# Patient Record
Sex: Male | Born: 1955 | Race: White | Hispanic: No | Marital: Married | Smoking: Current every day smoker
Health system: Southern US, Community
[De-identification: ages and names within clinical notes are randomized; demographics above are authoritative.]

## PROBLEM LIST (undated history)

## (undated) DIAGNOSIS — R39198 Other difficulties with micturition: Secondary | ICD-10-CM

## (undated) DIAGNOSIS — H05231 Hemorrhage of right orbit: Secondary | ICD-10-CM

## (undated) DIAGNOSIS — S81802A Unspecified open wound, left lower leg, initial encounter: Secondary | ICD-10-CM

## (undated) DIAGNOSIS — L97929 Non-pressure chronic ulcer of unspecified part of left lower leg with unspecified severity: Secondary | ICD-10-CM

## (undated) DIAGNOSIS — I96 Gangrene, not elsewhere classified: Secondary | ICD-10-CM

## (undated) DIAGNOSIS — L97509 Non-pressure chronic ulcer of other part of unspecified foot with unspecified severity: Secondary | ICD-10-CM

## (undated) DIAGNOSIS — N400 Enlarged prostate without lower urinary tract symptoms: Secondary | ICD-10-CM

## (undated) DIAGNOSIS — N179 Acute kidney failure, unspecified: Secondary | ICD-10-CM

## (undated) DIAGNOSIS — E039 Hypothyroidism, unspecified: Secondary | ICD-10-CM

## (undated) DIAGNOSIS — S6291XA Unspecified fracture of right wrist and hand, initial encounter for closed fracture: Secondary | ICD-10-CM

## (undated) DIAGNOSIS — G629 Polyneuropathy, unspecified: Secondary | ICD-10-CM

## (undated) DIAGNOSIS — L03116 Cellulitis of left lower limb: Secondary | ICD-10-CM

## (undated) DIAGNOSIS — T4145XA Adverse effect of unspecified anesthetic, initial encounter: Secondary | ICD-10-CM

## (undated) DIAGNOSIS — I739 Peripheral vascular disease, unspecified: Secondary | ICD-10-CM

## (undated) DIAGNOSIS — I509 Heart failure, unspecified: Secondary | ICD-10-CM

## (undated) DIAGNOSIS — T879 Unspecified complications of amputation stump: Secondary | ICD-10-CM

## (undated) DIAGNOSIS — L03115 Cellulitis of right lower limb: Secondary | ICD-10-CM

## (undated) DIAGNOSIS — Z792 Long term (current) use of antibiotics: Secondary | ICD-10-CM

## (undated) DIAGNOSIS — T8859XA Other complications of anesthesia, initial encounter: Secondary | ICD-10-CM

## (undated) DIAGNOSIS — L0390 Cellulitis, unspecified: Secondary | ICD-10-CM

## (undated) HISTORY — DX: Cellulitis of right lower limb: L03.116

## (undated) HISTORY — DX: Gangrene, not elsewhere classified: I96

## (undated) HISTORY — DX: Non-pressure chronic ulcer of unspecified part of left lower leg with unspecified severity: L97.929

## (undated) HISTORY — DX: Peripheral vascular disease, unspecified: I73.9

## (undated) HISTORY — DX: Non-pressure chronic ulcer of other part of unspecified foot with unspecified severity: L97.509

## (undated) HISTORY — DX: Polyneuropathy, unspecified: G62.9

## (undated) HISTORY — DX: Hypothyroidism, unspecified: E03.9

## (undated) HISTORY — DX: Heart failure, unspecified: I50.9

## (undated) HISTORY — DX: Unspecified fracture of right hand, initial encounter for closed fracture: S62.91XA

## (undated) HISTORY — DX: Hemorrhage of right orbit: H05.231

## (undated) HISTORY — DX: Cellulitis, unspecified: L03.90

## (undated) HISTORY — PX: CYST EXCISION: SHX5701

## (undated) HISTORY — DX: Acute kidney failure, unspecified: N17.9

## (undated) HISTORY — DX: Cellulitis of right lower limb: L03.115

## (undated) HISTORY — DX: Unspecified complications of amputation stump: T87.9

## (undated) HISTORY — DX: Unspecified open wound, left lower leg, initial encounter: S81.802A

## (undated) HISTORY — PX: PILONIDAL CYST DRAINAGE: SHX743

## (undated) HISTORY — DX: Long term (current) use of antibiotics: Z79.2

---

## 2017-10-19 DIAGNOSIS — T796XXA Traumatic ischemia of muscle, initial encounter: Secondary | ICD-10-CM

## 2017-10-19 DIAGNOSIS — W19XXXA Unspecified fall, initial encounter: Secondary | ICD-10-CM | POA: Diagnosis not present

## 2017-10-19 DIAGNOSIS — L03116 Cellulitis of left lower limb: Secondary | ICD-10-CM

## 2017-10-19 DIAGNOSIS — S0993XA Unspecified injury of face, initial encounter: Secondary | ICD-10-CM

## 2017-10-19 DIAGNOSIS — I5042 Chronic combined systolic (congestive) and diastolic (congestive) heart failure: Secondary | ICD-10-CM

## 2017-10-19 DIAGNOSIS — G8191 Hemiplegia, unspecified affecting right dominant side: Secondary | ICD-10-CM | POA: Diagnosis not present

## 2017-10-20 DIAGNOSIS — S0993XA Unspecified injury of face, initial encounter: Secondary | ICD-10-CM | POA: Diagnosis not present

## 2017-10-20 DIAGNOSIS — I509 Heart failure, unspecified: Secondary | ICD-10-CM

## 2017-10-20 DIAGNOSIS — L03116 Cellulitis of left lower limb: Secondary | ICD-10-CM | POA: Diagnosis not present

## 2017-10-20 DIAGNOSIS — T796XXA Traumatic ischemia of muscle, initial encounter: Secondary | ICD-10-CM | POA: Diagnosis not present

## 2017-10-20 DIAGNOSIS — I5042 Chronic combined systolic (congestive) and diastolic (congestive) heart failure: Secondary | ICD-10-CM | POA: Diagnosis not present

## 2017-10-21 DIAGNOSIS — L03116 Cellulitis of left lower limb: Secondary | ICD-10-CM | POA: Diagnosis not present

## 2017-10-21 DIAGNOSIS — T796XXA Traumatic ischemia of muscle, initial encounter: Secondary | ICD-10-CM | POA: Diagnosis not present

## 2017-10-21 DIAGNOSIS — I5042 Chronic combined systolic (congestive) and diastolic (congestive) heart failure: Secondary | ICD-10-CM | POA: Diagnosis not present

## 2017-10-21 DIAGNOSIS — S0993XA Unspecified injury of face, initial encounter: Secondary | ICD-10-CM | POA: Diagnosis not present

## 2017-10-22 DIAGNOSIS — R5383 Other fatigue: Secondary | ICD-10-CM

## 2017-10-22 HISTORY — DX: Other fatigue: R53.83

## 2017-10-24 ENCOUNTER — Encounter: Payer: Self-pay | Admitting: Vascular Surgery

## 2017-10-25 ENCOUNTER — Ambulatory Visit: Payer: PRIVATE HEALTH INSURANCE | Admitting: Sports Medicine

## 2017-10-25 ENCOUNTER — Telehealth: Payer: Self-pay | Admitting: *Deleted

## 2017-10-25 ENCOUNTER — Encounter: Payer: Self-pay | Admitting: Sports Medicine

## 2017-10-25 DIAGNOSIS — I96 Gangrene, not elsewhere classified: Secondary | ICD-10-CM | POA: Diagnosis not present

## 2017-10-25 DIAGNOSIS — I739 Peripheral vascular disease, unspecified: Secondary | ICD-10-CM

## 2017-10-25 DIAGNOSIS — M79672 Pain in left foot: Secondary | ICD-10-CM

## 2017-10-25 DIAGNOSIS — I83029 Varicose veins of left lower extremity with ulcer of unspecified site: Secondary | ICD-10-CM

## 2017-10-25 DIAGNOSIS — I87013 Postthrombotic syndrome with ulcer of bilateral lower extremity: Secondary | ICD-10-CM | POA: Diagnosis not present

## 2017-10-25 DIAGNOSIS — L97919 Non-pressure chronic ulcer of unspecified part of right lower leg with unspecified severity: Secondary | ICD-10-CM

## 2017-10-25 DIAGNOSIS — L97929 Non-pressure chronic ulcer of unspecified part of left lower leg with unspecified severity: Secondary | ICD-10-CM

## 2017-10-25 NOTE — Telephone Encounter (Signed)
Left message informing Peter Cochran - provider that Dr. Marylene LandStover had ordered ABI with TBI and arterial dopplers, and possible the ABI were previously performed in the hospital 10/19/2017 and to call again with questions.

## 2017-10-25 NOTE — Telephone Encounter (Signed)
I informed pt's wife, Blance of 10/29/2017 arrive 10:30am for 11:00am testing. Blance states pt had same test in the hospital within this week.

## 2017-10-25 NOTE — Progress Notes (Signed)
Subjective: Peter Cochran is a 62 y.o. male patient seen in office for evaluation of ulceration of the left first and second toes and bilateral legs. Patient denies a history of diabetes but does admit to a history of circulation problems states that he has had these wounds for at least 8 months and worsening of the left first and second toes over the last 3 weeks was seen at the wound care center and also has home nursing that are changing dressings daily with lots of drainage noted especially from left leg ulceration.  Patient admits that he was in the hospital for about 3 days and during this timeframe he is unsure if anyone did anything to check his feet.  Patient states that he has constant burning pain especially on the left leg where nothing can touch or compress the leg and states that they use to try to wrap his legs with Ace compression but it was too painful for him to tolerate.  Patient is currently on Bactrim antibiotics started on Monday. Denies nausea/fever/vomiting/chills/night sweats/shortness of breath/pain. Patient has no other pedal complaints at this time.  Review of Systems  All other systems reviewed and are negative.    Patient Active Problem List   Diagnosis Date Noted  . Fatigue 10/22/2017   Current Outpatient Medications on File Prior to Visit  Medication Sig Dispense Refill  . aspirin EC 81 MG tablet Take 81 mg by mouth daily.    Marland Kitchen atorvastatin (LIPITOR) 40 MG tablet Take 40 mg by mouth daily.    . carvedilol (COREG) 3.125 MG tablet Take 3.125 mg by mouth 2 (two) times daily with a meal.    . levothyroxine (SYNTHROID, LEVOTHROID) 75 MCG tablet Take 75 mcg by mouth daily before breakfast.    . lisinopril (PRINIVIL,ZESTRIL) 5 MG tablet Take 5 mg by mouth daily.    . pregabalin (LYRICA) 75 MG capsule Take 75 mg by mouth 2 (two) times daily.    Marland Kitchen spironolactone (ALDACTONE) 25 MG tablet Take 25 mg by mouth 2 (two) times daily.    Marland Kitchen sulfamethoxazole-trimethoprim (BACTRIM  DS,SEPTRA DS) 800-160 MG tablet Take 1 tablet by mouth 2 (two) times daily.     No current facility-administered medications on file prior to visit.    Allergies  Allergen Reactions  . Oxytetracycline   . Penicillins     No results found for this or any previous visit (from the past 2160 hour(s)).  Objective: There were no vitals filed for this visit.  General: Patient is awake, alert, oriented x 3 and in no acute distress.  Dermatology: Skin is warm and  bilateral with significant ulceration down the entire posterior and lateral left leg with a mix of fibro-granular tissue and necrotic patches and slough and a rolled border irregular fashion consistent with venous stasis ulceration, patient also has necrotic gangrene to the left first and second toes that is dry in nature and well demarcated, there is also multiple small venous stasis ulcers at the right anterior leg and lower shin with clear superficial drainage.  To all wounds there is slight malodor that improves with cleansing the legs as well as venous hyperpigmentation and hyperemia that is mildly warm with diffuse swelling. Left foot and leg wounds as pictured:    Vascular: Dorsalis Pedis pulse = 0/4 Bilateral,  Posterior Tibial pulse = 0/4 Bilateral,  Capillary Fill Time < 5 seconds except at left first and second toes on Doppler that was performed in office venous regurg is heard  difficult to hear any arterial sounds due to edema.  Neurologic: Sensation is hypersensitive bilateral.  Musculosketal: There is mild pain with palpation to ulcerated areas.   No results for input(s): GRAMSTAIN, LABORGA in the last 8760 hours.  Assessment and Plan:  Problem List Items Addressed This Visit    None    Visit Diagnoses    Gangrene of toe of left foot (HCC)    -  Primary   Venous ulcer-leg syndrome, bilateral (HCC)       Left foot pain       PAD (peripheral artery disease) (HCC)         -Examined patient and discussed the  progression of the wound and treatment alternatives. -[facility] -Cleanse ulceration slightly to patient's tolerance apply Betadine to left first and second toes and applied dry dressing to venous leg wounds -Recommend to continue with home nursing for daily dressing changes -Ordered more in-depth ABIs and also discussed case with interventional radiologist Dr. Archer AsaMcCullough who recommended we order CT angios with runoff to further assist patient with wound healing - Advised patient to go to the ER or return to office if the wound worsens or if constitutional symptoms are present. -Continue with Bactrim -Patient to return to office after vascular testing for follow up care and evaluation or sooner if problems arise.  Asencion Islamitorya Kenasia Scheller, DPM

## 2017-10-25 NOTE — Telephone Encounter (Signed)
Peter Cochran - [facility] scheduled 10/29/2017 arrive 10:30am for 11:00am.

## 2017-10-25 NOTE — Telephone Encounter (Signed)
Peter NortonAshley Wilson - provider called to discuss test ordered by Dr. Marylene LandStover.

## 2017-10-25 NOTE — Telephone Encounter (Signed)
-----   Message from Asencion Islamitorya Stover, [place] sent at 10/25/2017  9:15 AM EDT ----- Regarding: ABI bilateral  Dry gangrene of left 1-2 toes and severe venous stasis ulcers bilateral Please make sure patient can get ABIs as soon as possible

## 2017-10-25 NOTE — Telephone Encounter (Signed)
Peter Cochran - [facility]-Records states pt had ABI on 10/19/2017.

## 2017-10-27 ENCOUNTER — Encounter: Payer: Self-pay | Admitting: Cardiology

## 2017-10-27 DIAGNOSIS — I420 Dilated cardiomyopathy: Secondary | ICD-10-CM | POA: Insufficient documentation

## 2017-10-27 DIAGNOSIS — R7989 Other specified abnormal findings of blood chemistry: Secondary | ICD-10-CM

## 2017-10-27 DIAGNOSIS — I340 Nonrheumatic mitral (valve) insufficiency: Secondary | ICD-10-CM

## 2017-10-27 DIAGNOSIS — R778 Other specified abnormalities of plasma proteins: Secondary | ICD-10-CM

## 2017-10-27 DIAGNOSIS — L97922 Non-pressure chronic ulcer of unspecified part of left lower leg with fat layer exposed: Secondary | ICD-10-CM

## 2017-10-27 DIAGNOSIS — I739 Peripheral vascular disease, unspecified: Secondary | ICD-10-CM | POA: Insufficient documentation

## 2017-10-27 HISTORY — DX: Non-pressure chronic ulcer of unspecified part of left lower leg with fat layer exposed: L97.922

## 2017-10-27 HISTORY — DX: Dilated cardiomyopathy: I42.0

## 2017-10-27 HISTORY — DX: Nonrheumatic mitral (valve) insufficiency: I34.0

## 2017-10-27 HISTORY — DX: Other specified abnormal findings of blood chemistry: R79.89

## 2017-10-27 HISTORY — DX: Other specified abnormalities of plasma proteins: R77.8

## 2017-10-27 NOTE — Progress Notes (Signed)
Cardiology Office Note:    Date:  10/28/2017   ID:  Peter Cochran, DOB 1956-01-09, MRN [id number]  PCP:  Floria RavelingWilson, Ashley H, FNP  Cardiologist:  Norman HerrlichBrian Munley, MD   Referring MD: No ref. provider found  ASSESSMENT:    1. Dilated cardiomyopathy (HCC)   2. Non-rheumatic mitral regurgitation   3. Elevated troponin   4. Elevated brain natriuretic peptide (BNP) level   5. PAD (peripheral artery disease) (HCC)   6. Leg ulcer, left, with fat layer exposed (HCC)    PLAN:    In order of problems listed above:  1. He has evidence of decompensation heart failure fluid overloaded.  I have asked him to stop gabapentin which favors edema.  He will start a loop diuretic furosemide 20 mg daily.  With low blood pressure he will reduce his lisinopril to 2.5 mg 3 days a week and hold if systolics less than 100 reduce his Spironolactone by 50% continue minimum dose of carvedilol.  He is at risk for ventricular arrhythmia but none identified in hospital I would utilize a LifeVest.  He also require an ischemia evaluation at some point.  I would like to try to optimize treatment and compensated heart failure before that.  I have asked him to sodium restrict and we will see if we get the heart failure program to come and visit him at home.  Recheck renal function and BMP at his next visit and try to access labs done Friday at his PCP office 2. Mild secondary to cardiomyopathy 3. Secondary to severe cardiomyopathy 4. Consistent with a severe cardiomyopathy 5. With nonhealing leg wound is down to a few cigarettes a day and I told him strongly he needs to stop and I will see if I can expedite his appointment with vascular surgery 6. Continue home health care weight appointments with vascular surgery  Next appointment 2 weeks   Medication Adjustments/Labs and Tests Ordered: Current medicines are reviewed at length with the patient today.  Concerns regarding medicines are outlined above.  No orders of the defined  types were placed in this encounter.  Meds ordered this encounter  Medications  . spironolactone (ALDACTONE) 25 MG tablet    Sig: Take 1 tablet (25 mg total) by mouth daily.    Dispense:  90 tablet    Refill:  3  . furosemide (LASIX) 20 MG tablet    Sig: Take 1 tablet (20 mg total) by mouth daily.    Dispense:  90 tablet    Refill:  3  . lisinopril (PRINIVIL,ZESTRIL) 2.5 MG tablet    Sig: Take 1 tablet (2.5 mg total) by mouth daily.    Dispense:  45 tablet    Refill:  3     Chief Complaint  Patient presents with  . Hospitalization Follow-up    recently recognized cardiomyopathy  . Congestive Heart Failure  . Mitral Regurgitation  . PAD  . Hyperlipidemia    History of Present Illness:    Peter Cochran is a 62 y.o. male who is being seen today for the evaluation of an elevated Pro BNP and Troponin with fall, rhabdomyolysis, cellulitisand leg ulcer and hand farcture  at the request of Susa RaringPrashant Singh [facility] at [facility] at discharge. He was found to have PAD and a severe cardiomyopathy.  Records prior to this visit.  In retrospect for over 1 year he has been sedentary and has had marked peripheral edema.  He developed nonhealing leg wound.  This admission was caused by  an episode where he lost consciousness with trauma.  In the emergency room he had elevated troponin without evidence of myocardial infarction elevated BNP level.  He denies shortness of breath palpitation other syncope or chest pain he has no known history of congenital or rheumatic heart disease.  He previously was on gabapentin recently was restarted.  He is not taking loop diuretic.  Since discharge she has had VNA but not the home heart failure program and his systolic blood pressures been running in the range of 100.  He had noninvasive ABIs performed consistent with severe arterial disease in the left lower extremity and is awaiting vascular evaluation.   Past Medical History:  Diagnosis Date  . Cellulitis    mild.  L leg    . CHF (congestive heart failure) (HCC)    Dr. Norman Herrlich  . 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  . Hand fracture, right    Dr. Linda Hedges  . Hypothyroidism (acquired)   . Leg ulcer, left (HCC)   . PAD (peripheral artery disease) (HCC)   . Periorbital hematoma, right   . Peripheral neuropathy   . Toe ulcer (HCC)    Left  . Wound of left leg    Non-healing. Dr. Liston Alba, Dr. Bynum Bellows.    History reviewed. No pertinent surgical history.  Current Medications: Current Meds  Medication Sig  . aspirin EC 81 MG tablet Take 81 mg by mouth daily.  Marland Kitchen atorvastatin (LIPITOR) 40 MG tablet Take 40 mg by mouth daily.  . carvedilol (COREG) 3.125 MG tablet Take 3.125 mg by mouth 2 (two) times daily with a meal.  . DULoxetine (CYMBALTA) 30 MG capsule Take 30 mg by mouth daily.  Marland Kitchen levothyroxine (SYNTHROID, LEVOTHROID) 75 MCG tablet Take 75 mcg by mouth daily before breakfast.  . lisinopril (PRINIVIL,ZESTRIL) 2.5 MG tablet Take 1 tablet (2.5 mg total) by mouth daily.  Marland Kitchen spironolactone (ALDACTONE) 25 MG tablet Take 1 tablet (25 mg total) by mouth daily.  Marland Kitchen sulfamethoxazole-trimethoprim (BACTRIM DS,SEPTRA DS) 800-160 MG tablet Take 1 tablet by mouth 2 (two) times daily.  . [DISCONTINUED] gabapentin (NEURONTIN) 100 MG capsule Take 100 mg by mouth 3 (three) times daily.  . [DISCONTINUED] lisinopril (PRINIVIL,ZESTRIL) 5 MG tablet Take 5 mg by mouth daily.  . [DISCONTINUED] spironolactone (ALDACTONE) 25 MG tablet Take 25 mg by mouth 2 (two) times daily.     Allergies:   Oxytetracycline and Penicillins   Social History   Socioeconomic History  . Marital status: Married    Spouse name: Not on file  . Number of children: Not on file  . Years of education: Not on file  . Highest education level: Not on file  Occupational History  . Not on file  Social Needs  . Financial resource strain: Not on file  . Food insecurity:    Worry: Not on file    Inability: Not on file  .  Transportation needs:    Medical: Not on file    Non-medical: Not on file  Tobacco Use  . Smoking status: Current Every Day Smoker    Packs/day: 2.00    Types: Cigarettes  . Smokeless tobacco: Never Used  Substance and Sexual Activity  . Alcohol use: Not Currently  . Drug use: Not Currently  . Sexual activity: Not on file  Lifestyle  . Physical activity:    Days per week: Not on file    Minutes per session: Not on file  . Stress:  Not on file  Relationships  . Social connections:    Talks on phone: Not on file    Gets together: Not on file    Attends religious service: Not on file    Active member of club or organization: Not on file    Attends meetings of clubs or organizations: Not on file    Relationship status: Not on file  Other Topics Concern  . Not on file  Social History Narrative  . Not on file     Family History: The patient's family history includes Diabetes in his father; Heart attack in his father; Hyperlipidemia in his father and mother; Hypertension in his father and mother; Stroke in his maternal grandmother.  ROS:   Review of Systems  Constitution: Positive for malaise/fatigue.  HENT: Negative.   Eyes: Negative.   Cardiovascular: Positive for leg swelling (for 1 year).  Respiratory: Negative.   Endocrine: Negative.   Hematologic/Lymphatic: Negative.   Skin: Negative.   Musculoskeletal: Negative.   Gastrointestinal: Negative.   Genitourinary: Negative.   Neurological: Negative.   Psychiatric/Behavioral: Negative.   Allergic/Immunologic: Negative.    Please see the history of present illness.     All other systems reviewed and are negative.  EKGs/Labs/Other Studies Reviewed:    The following studies were reviewed today:   EKG:  SRTH low voltage ABI: biphasic RLE moderately decreased, monophasic LLE 1.7  With poor compressibility Echo: EF 25-30%, mild LAE, mild MR  Recent Labs: 10/21/17 CBC normalBMP normal ProBNP 1290, troponin 0.2, TSH  20 No results found for requested labs within last 8760 hours.  Recent Lipid Panel Chol 125, HDL 39 LDL65 No results found for: CHOL, TRIG, HDL, CHOLHDL, VLDL, LDLCALC, LDLDIRECT  Physical Exam:    VS:  BP (!) 86/50 (BP Location: Right Arm, Patient Position: Sitting, Cuff Size: Large)   Pulse 89   Ht 5\' 11"  (1.803 m)   Wt 235 lb (106.6 kg)   SpO2 96%   BMI 32.78 kg/m     Wt Readings from Last 3 Encounters:  10/28/17 235 lb (106.6 kg)     GEN:  Well nourished, well developed in no acute distress HEENT: Normal NECK: No JVD; No carotid bruits LYMPHATICS: No lymphadenopathy CARDIAC: soft s1 RRR, no murmurs, rubs, gallops RESPIRATORY:  Clear to auscultation without rales, wheezing or rhonchi  ABDOMEN: Soft, non-tender, non-distended MUSCULOSKELETAL:  4+ edema above the knees edema; No deformity  SKIN: Warm and dry NEUROLOGIC:  Alert and oriented x 3 PSYCHIATRIC:  Normal affect     Signed, Norman Herrlich, MD  10/28/2017 10:37 AM    [facility]

## 2017-10-28 ENCOUNTER — Telehealth: Payer: Self-pay | Admitting: *Deleted

## 2017-10-28 ENCOUNTER — Encounter: Payer: Self-pay | Admitting: Cardiology

## 2017-10-28 ENCOUNTER — Ambulatory Visit: Payer: PRIVATE HEALTH INSURANCE | Admitting: Cardiology

## 2017-10-28 DIAGNOSIS — I340 Nonrheumatic mitral (valve) insufficiency: Secondary | ICD-10-CM | POA: Diagnosis not present

## 2017-10-28 DIAGNOSIS — I420 Dilated cardiomyopathy: Secondary | ICD-10-CM

## 2017-10-28 DIAGNOSIS — L97922 Non-pressure chronic ulcer of unspecified part of left lower leg with fat layer exposed: Secondary | ICD-10-CM

## 2017-10-28 DIAGNOSIS — I739 Peripheral vascular disease, unspecified: Secondary | ICD-10-CM

## 2017-10-28 DIAGNOSIS — R778 Other specified abnormalities of plasma proteins: Secondary | ICD-10-CM

## 2017-10-28 DIAGNOSIS — R748 Abnormal levels of other serum enzymes: Secondary | ICD-10-CM

## 2017-10-28 DIAGNOSIS — I5022 Chronic systolic (congestive) heart failure: Secondary | ICD-10-CM

## 2017-10-28 DIAGNOSIS — R7989 Other specified abnormal findings of blood chemistry: Secondary | ICD-10-CM | POA: Diagnosis not present

## 2017-10-28 HISTORY — DX: Chronic systolic (congestive) heart failure: I50.22

## 2017-10-28 MED ORDER — LISINOPRIL 2.5 MG PO TABS: 2.5000 mg | ORAL_TABLET | Freq: Every day | ORAL | 3 refills | Status: DC

## 2017-10-28 MED ORDER — SPIRONOLACTONE 25 MG PO TABS: 25.0000 mg | ORAL_TABLET | Freq: Every day | ORAL | 3 refills | Status: DC

## 2017-10-28 MED ORDER — FUROSEMIDE 20 MG PO TABS: 20.0000 mg | ORAL_TABLET | Freq: Every day | ORAL | 3 refills | Status: DC

## 2017-10-28 NOTE — Telephone Encounter (Signed)
Patients wife just wanted to update you he was unable to have the tests done  Dr Dulce SellarMunley said to cancel the appt said he would not be able to stand the pain. .Marland Kitchen

## 2017-10-28 NOTE — Patient Instructions (Addendum)
Medication Instructions:  Your physician has recommended you make the following change in your medication:  STOP gabapentin  CHANGE lisinopril. Take 2.5 mg every other day. Hold for top blood pressure less than 100.  DECREASE spironolactone to once daily  START fuorsemide (Lasix) 20 mg daily  Labwork: None  Testing/Procedures: None  Follow-Up: Your physician recommends that you schedule a follow-up appointment in: 2 weeks.  You will receive a phone call to schedule with [facility] Heart Failure.  Any Other Special Instructions Will Be Listed Below (If Applicable).     If you need a refill on your cardiac medications before your next appointment, please call your pharmacy.    Heart Failure Action Plan A heart failure action plan helps you understand what to do when you have symptoms of heart failure. Follow the plan that was created by you and your health care provider. Review your plan each time you visit your health care provider. Red zone These signs and symptoms mean you should get medical help right away:  You have trouble breathing when resting.  You have a dry cough that is getting worse.  You have swelling or pain in your legs or abdomen that is getting worse.  You suddenly gain more than 2-3 lb (0.9-1.4 kg) in a day, or more than 5 lb (2.3 kg) in one week. This amount may be more or less depending on your condition.  You have trouble staying awake or you feel confused.  You have chest pain.  You do not have an appetite.  You pass out.  If you experience any of these symptoms:  Call your local emergency services (911 in the [place] right away or seek help at the emergency department of the nearest hospital.  Yellow zone These signs and symptoms mean your condition may be getting worse and you should make some changes:  You have trouble breathing when you are active or you need to sleep with extra pillows.  You have swelling in your legs or  abdomen.  You gain 2-3 lb (0.9-1.4 kg) in one day, or 5 lb (2.3 kg) in one week. This amount may be more or less depending on your condition.  You get tired easily.  You have trouble sleeping.  You have a dry cough.  If you experience any of these symptoms:  Contact your health care provider within the next day.  Your health care provider may adjust your medicines.  Green zone These signs mean you are doing well and can continue what you are doing:  You do not have shortness of breath.  You have very little swelling or no new swelling.  Your weight is stable (no gain or loss).  You have a normal activity level.  You do not have chest pain or any other new symptoms.  Follow these instructions at home:  Take over-the-counter and prescription medicines only as told by your health care provider.  Weigh yourself daily. Your target weight is __________ lb (__________ kg). ? Call your health care provider if you gain more than __________ lb (__________ kg) in a day, or more than __________ lb (__________ kg) in one week.  Eat a heart-healthy diet. Work with a diet and nutrition specialist (dietitian) to create an eating plan that is best for you.  Keep all follow-up visits as told by your health care provider. This is important. Where to find more information:  American Heart Association: www.heart.org Summary  Follow the action plan that was created  by you and your health care provider.  Get help right away if you have any symptoms in the Red zone. This information is not intended to replace advice given to you by your health care provider. Make sure you discuss any questions you have with your health care provider. Document Released: 08/11/2016 Document Revised: 08/11/2016 Document Reviewed: 08/11/2016 Elsevier Interactive Patient Education  2018 [employer]  Heart Failure  Weigh yourself every morning when you first wake up and record on a calender or note pad,  bring this to your office visits. Using a pill tender can help with taking your medications consistently.  Limit your fluid intake to 2 liters daily  Limit your sodium intake to less than 2-3 grams daily. Ask if you need dietary teaching.  If you gain more than 3 pounds (from your dry weight ), double your dose of diuretic for the day.  If you gain more than 5 pounds (from your dry weight), double your dose of lasix and call your heart failure doctor.  Please do not smoke tobacco since it is very bad for your heart.  Please do not drink alcohol since it can worsen your heart failure.Also avoid OTC nonsteroidal drugs, such as advil, aleve and motrin.  Try to exercise for at least 30 minutes every day because this will help your heart be more efficient. You may be eligible for supervised cardiac rehab, ask your physician.

## 2017-10-28 NOTE — Telephone Encounter (Signed)
Ok thanks 

## 2017-10-28 NOTE — Telephone Encounter (Signed)
-----   Message from Asencion Islamitorya Stover, [place] sent at 10/25/2017  5:08 PM EDT ----- Regarding: Vascular studies Hi Val I discussed the case with Dr. Archer AsaMcCullough he also said we should order for this patient CT Angio with run-off Thanks Dr. Marylene LandStover

## 2017-10-28 NOTE — Telephone Encounter (Signed)
Peter Cochran -  [facility] states will fax clinicals to our office.

## 2017-10-28 NOTE — Telephone Encounter (Signed)
-----   Message from Peter Cochran, DPM sent at 10/25/2017  5:41 PM EDT ----- Regarding: Request charts Hi Peter Cochran Can you please contact [place] wound care center to request them to send over all patient notes/chart so that way I can check to see what has been done for this patient's extensive foot and leg wounds and for the gangrene that he has at his toes.  I also  left a message for his home nurse named Michelle at phone #[phone] for her to call me to let me know what the current wound care orders are so that I can make further recommendations. 

## 2017-10-28 NOTE — Telephone Encounter (Signed)
-----   Message from Asencion Islamitorya Stover, [place] sent at 10/25/2017  5:41 PM EDT ----- Regarding: Request charts Hi Laurieann Friddle Can you please [facility] to request them to send over all patient notes/chart so that way I can check to see what has been done for this patient's extensive foot and leg wounds and for the gangrene that he has at his toes.  I also  left a message for his home nurse named Marcelino DusterMichelle at phone #[phone] for her to call me to let me know what the current wound care orders are so that I can make further recommendations.

## 2017-10-28 NOTE — Telephone Encounter (Signed)
Peter Cochran - Peter Cochran states CT Angiography and pt's Group are reviewed by a different department, transferred to Peter Cochran. Peter Bromeeresa - Peter Cochran [phone] states clinicals need to be faxed to [place] [phone], with Pending Reference: [id number].

## 2017-10-29 NOTE — Telephone Encounter (Signed)
[employer] [facility] [place] - [id number], valid 10/28/2017 - 01/26/2018.

## 2017-10-29 NOTE — Telephone Encounter (Signed)
Peter Cochran - [facility] schedule 10/31/2017 arrive 10:30am, testing 11:00am.

## 2017-10-29 NOTE — Telephone Encounter (Addendum)
Unable to leave message with appt time, due phone was busy, called pt's wife's phone.

## 2017-10-29 NOTE — Telephone Encounter (Signed)
Left message on pt's wife, Blance phone informing of [facility] times at [facility] 10/31/2017, and to confirm receipt.

## 2017-10-30 ENCOUNTER — Telehealth: Payer: Self-pay | Admitting: Sports Medicine

## 2017-10-30 NOTE — Telephone Encounter (Signed)
I spoke to pt's wife, Blance, she stated pt may not be able to have the test, his legs are so painful he can not lay them down. I gave Mayra ReelBlance, [facility] [phone] to have a CT technician explain.

## 2017-10-30 NOTE — Telephone Encounter (Signed)
Patients wife called and said she didn't understand the message you left for her. If you can call her back and re-explain what you said. Her number is [phone]

## 2017-11-04 ENCOUNTER — Telehealth: Payer: Self-pay | Admitting: *Deleted

## 2017-11-04 DIAGNOSIS — I872 Venous insufficiency (chronic) (peripheral): Secondary | ICD-10-CM

## 2017-11-04 NOTE — Telephone Encounter (Signed)
Unable to leave a message phone number give was busy x2.

## 2017-11-04 NOTE — Telephone Encounter (Signed)
Pt's wife, Peter Cochran states she needs to talk about her husband.

## 2017-11-04 NOTE — Telephone Encounter (Signed)
Pt's wife, Peter Cochran states husband is unable to perform any of the vascular test, due to he is not able to lay down or still for 15 minutes and Dr. Dulce SellarMunley is going to order other testing, they think he has Congestive Heart Failure.

## 2017-11-04 NOTE — Telephone Encounter (Signed)
Thanks

## 2017-11-11 NOTE — Progress Notes (Signed)
Cardiology Office Note:    Date:  11/12/2017   ID:  Peter Cochran, DOB [birth year], MRN [id number]  PCP:  Floria Raveling, FNP  Cardiologist:  Norman Herrlich, MD    Referring MD: Floria Raveling, FNP    ASSESSMENT:    1. Chronic systolic heart failure (HCC)   2. PAD (peripheral artery disease) (HCC)   3. Non-rheumatic mitral regurgitation    PLAN:    In order of problems listed above:  1. Functionally improved heart failure compensated continue treatment transition to ARNI an ischemia evaluation and gated pole ejection fraction.  If EF remains severely diminished we may need to begin to consider ICD therapy.  If he has ischemic markers he would benefit from revascularization 2. Continue wound care at [facility] weight vascular surgery 3. Stable functional secondary to his heart failure   Next appointment: 4 weeks   Medication Adjustments/Labs and Tests Ordered: Current medicines are reviewed at length with the patient today.  Concerns regarding medicines are outlined above.  No orders of the defined types were placed in this encounter.  No orders of the defined types were placed in this encounter.   Chief Complaint  Patient presents with  . Follow-up    History of Present Illness:    Peter Cochran is a 62 y.o. male with a hx of systolic heart failure and PAD last seen 10/28/17. Home health HF vest score=28  ASSESSMENT:    10/28/17   1. Dilated cardiomyopathy (HCC)   2. Non-rheumatic mitral regurgitation   3. Elevated troponin   4. Elevated brain natriuretic peptide (BNP) level   5. PAD (peripheral artery disease) (HCC)   6. Leg ulcer, left, with fat layer exposed (HCC)    PLAN:    In order of problems listed above:  1.   He has evidence of decompensation heart failure fluid overloaded.  I have asked him to stop gabapentin which favors edema.  He will start a loop diuretic furosemide 20 mg daily.  With low blood pressure he will reduce his lisinopril  to 2.5 mg 3 days a week and hold if systolics less than 100 reduce his Spironolactone by 50% continue minimum dose of carvedilol.  He is at risk for ventricular arrhythmia but none identified in hospital I would utilize a LifeVest.  He also require an ischemia evaluation at some point.  I would like to try to optimize treatment and compensated heart failure before that.  I have asked him to sodium restrict and we will see if we get the heart failure program to come and visit him at home.  Recheck renal function and BMP at his next visit and try to access labs done Friday at his PCP office 4. Mild secondary to cardiomyopathy 5. Secondary to severe cardiomyopathy 6. Consistent with a severe cardiomyopathy 7. With nonhealing leg wound is down to a few cigarettes a day and I told him strongly he needs to stop and I will see if I can expedite his appointment with vascular surgery 8. Continue home health care weight appointments with vascular surgery  Compliance with diet, lifestyle and medications: Yes Unfortunately he is unable to weigh at home.  He feels markedly improved his edema is resolved shortness of breath has past no palpitation or syncope but complains of leg pain with his peripheral arterial disease worse supine and sleeping in a chair with his legs down.  He is being followed in the wound clinic in [place] and is scheduled for follow-up  with vascular surgery in May.  His blood pressures run in the range of 90-100 she tolerates a minimum dose of ACE inhibitor will continue his current loop diuretic spironolactone beta-blocker and transition from ACE to ARNI after 3-day hiatus.  He will have labs drawn later today copy to my office at his previous workplace.  He will undergo myocardial perfusion study for CAD ischemia evaluation follow-up in 6 weeks Past Medical History:  Diagnosis Date  . Cellulitis    mild.  L leg  . CHF (congestive heart failure) (HCC)    Dr. Norman Herrlich  . 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  . Hand fracture, right    Dr. Linda Hedges  . Hypothyroidism (acquired)   . Leg ulcer, left (HCC)   . PAD (peripheral artery disease) (HCC)   . Periorbital hematoma, right   . Peripheral neuropathy   . Toe ulcer (HCC)    Left  . Wound of left leg    Non-healing. Dr. Liston Alba, Dr. Bynum Bellows.    History reviewed. No pertinent surgical history.  Current Medications: Current Meds  Medication Sig  . aspirin EC 81 MG tablet Take 81 mg by mouth daily.  Marland Kitchen atorvastatin (LIPITOR) 40 MG tablet Take 40 mg by mouth daily.  . carvedilol (COREG) 3.125 MG tablet Take 3.125 mg by mouth 2 (two) times daily with a meal.  . DULoxetine (CYMBALTA) 30 MG capsule Take 30 mg by mouth daily.  . furosemide (LASIX) 20 MG tablet Take 1 tablet (20 mg total) by mouth daily.  Marland Kitchen HYDROcodone-acetaminophen (NORCO/VICODIN) 5-325 MG tablet Take 1 tablet by mouth every 4 (four) hours as needed for pain.  Marland Kitchen levothyroxine (SYNTHROID, LEVOTHROID) 100 MCG tablet Take 100 mcg by mouth daily before breakfast.   . lisinopril (PRINIVIL,ZESTRIL) 2.5 MG tablet Take 1 tablet (2.5 mg total) by mouth daily.  Marland Kitchen spironolactone (ALDACTONE) 25 MG tablet Take 1 tablet (25 mg total) by mouth daily.  . Vitamin D, Ergocalciferol, (DRISDOL) 50000 units CAPS capsule Take 50,000 Units by mouth every 7 (seven) days.     Allergies:   Oxytetracycline; Penicillins; and Sulfa antibiotics   Social History   Socioeconomic History  . Marital status: Married    Spouse name: Not on file  . Number of children: Not on file  . Years of education: Not on file  . Highest education level: Not on file  Occupational History  . Not on file  Social Needs  . Financial resource strain: Not on file  . Food insecurity:    Worry: Not on file    Inability: Not on file  . Transportation needs:    Medical: Not on file    Non-medical: Not on file  Tobacco Use  . Smoking status: Current Every Day Smoker     Packs/day: 2.00    Types: Cigarettes  . Smokeless tobacco: Never Used  Substance and Sexual Activity  . Alcohol use: Not Currently  . Drug use: Not Currently  . Sexual activity: Not on file  Lifestyle  . Physical activity:    Days per week: Not on file    Minutes per session: Not on file  . Stress: Not on file  Relationships  . Social connections:    Talks on phone: Not on file    Gets together: Not on file    Attends religious service: Not on file    Active member of club or organization: Not on file    Attends  meetings of clubs or organizations: Not on file    Relationship status: Not on file  Other Topics Concern  . Not on file  Social History Narrative  . Not on file     Family History: The patient's family history includes Diabetes in his father; Heart attack in his father; Hyperlipidemia in his father and mother; Hypertension in his father and mother; Stroke in his maternal grandmother. ROS:   Please see the history of present illness.    All other systems reviewed and are negative.  EKGs/Labs/Other Studies Reviewed:    The following studies were reviewed today:    Recent Labs: No results found for requested labs within last 8760 hours.  Recent Lipid Panel No results found for: CHOL, TRIG, HDL, CHOLHDL, VLDL, LDLCALC, LDLDIRECT  Physical Exam:    VS:  BP (!) 86/50 (BP Location: Left Arm, Patient Position: Sitting, Cuff Size: Large)   Pulse 89   Ht  (1.803 m)   SpO2 93%   BMI 32.78 kg/m     Wt Readings from Last 3 Encounters:  10/28/17 235 lb (106.6 kg)     GEN:  Well nourished, well developed in no acute distress HEENT: Normal NECK: No JVD; No carotid bruits LYMPHATICS: No lymphadenopathy CARDIAC: soft s1 RRR, no murmurs, rubs, gallops RESPIRATORY:  Clear to auscultation without rales, wheezing or rhonchi  ABDOMEN: Soft, non-tender, non-distended MUSCULOSKELETAL:  No edema; No deformity  SKIN: Warm and dry NEUROLOGIC:  Alert and oriented x  3 PSYCHIATRIC:  Normal affect    Signed, Norman Herrlich, MD  11/12/2017 8:55 AM    [facility]

## 2017-11-12 ENCOUNTER — Ambulatory Visit: Payer: PRIVATE HEALTH INSURANCE | Admitting: Cardiology

## 2017-11-12 ENCOUNTER — Encounter: Payer: Self-pay | Admitting: Cardiology

## 2017-11-12 VITALS — BP 86/50 | HR 89 | Ht 71.0 in

## 2017-11-12 DIAGNOSIS — I5022 Chronic systolic (congestive) heart failure: Secondary | ICD-10-CM | POA: Diagnosis not present

## 2017-11-12 DIAGNOSIS — I739 Peripheral vascular disease, unspecified: Secondary | ICD-10-CM

## 2017-11-12 DIAGNOSIS — I429 Cardiomyopathy, unspecified: Secondary | ICD-10-CM

## 2017-11-12 DIAGNOSIS — I340 Nonrheumatic mitral (valve) insufficiency: Secondary | ICD-10-CM | POA: Diagnosis not present

## 2017-11-12 MED ORDER — SACUBITRIL-VALSARTAN 24-26 MG PO TABS: 1.0000 | ORAL_TABLET | ORAL | 3 refills | Status: DC

## 2017-11-12 NOTE — Patient Instructions (Addendum)
Medication Instructions:  Your physician has recommended you make the following change in your medication:  STOP lisinopril  START sacubitril-valsartan (Entresto) 24-26 mg once daily on Monday, Wednesday, Friday. DO NOT START until you have been off lisinopril for 3 days.  Labwork: None  Testing/Procedures: Your physician has requested that you have a lexiscan myoview. For further information please visit [email or web address] Please follow instruction sheet, as given.  [facility] [address] [place], [place] [address] [phone]  Lexiscan testing instructions:  Please present to [facility] 15 minutes earlier than your appointment time to allow for registration.  You will be called with an appointment date and time by our office once it has been scheduled; please allow at least 48 hours for [place] to contact you.  No food or drink after midnight prior to your test (except for small sips of water with your medications).  Hold the following medications the morning of your test: carvedilol  Bring a medication list or all your medications with you the morning of the testing.  No caffeine, decaffeinated or chocolate products 12 hours prior to the testing.  Please be aware that the test can take up to 3-4 hours. This is a 2 day test process and you will follow the same instructions for both days.   Should you have any problem with the appointment date or time, please call [phone].   Please call the office with any further questions or concerns.  Follow-Up: Your physician recommends that you schedule a follow-up appointment in: 1 month.  Any Other Special Instructions Will Be Listed Below (If Applicable).     If you need a refill on your cardiac medications before your next appointment, please call your pharmacy.    Heart Failure  Weigh yourself every morning when you first wake up and record on a calender or note pad, bring this to your  office visits. Using a pill tender can help with taking your medications consistently.  Limit your fluid intake to 2 liters daily  Limit your sodium intake to less than 2-3 grams daily. Ask if you need dietary teaching.  If you gain more than 3 pounds (from your dry weight ), double your dose of diuretic for the day.  If you gain more than 5 pounds (from your dry weight), double your dose of lasix and call your heart failure doctor.  Please do not smoke tobacco since it is very bad for your heart.  Please do not drink alcohol since it can worsen your heart failure.Also avoid OTC nonsteroidal drugs, such as advil, aleve and motrin.  Try to exercise for at least 30 minutes every day because this will help your heart be more efficient. You may be eligible for supervised cardiac rehab, ask your physician.

## 2017-11-20 ENCOUNTER — Other Ambulatory Visit: Payer: Self-pay

## 2017-11-20 MED ORDER — ATORVASTATIN CALCIUM 40 MG PO TABS: 40.0000 mg | ORAL_TABLET | Freq: Every day | ORAL | 11 refills | Status: DC

## 2017-11-20 MED ORDER — CARVEDILOL 3.125 MG PO TABS: 3.1250 mg | ORAL_TABLET | Freq: Two times a day (BID) | ORAL | 11 refills | Status: DC

## 2017-11-22 DIAGNOSIS — I429 Cardiomyopathy, unspecified: Secondary | ICD-10-CM

## 2017-11-25 ENCOUNTER — Telehealth: Payer: Self-pay

## 2017-11-25 ENCOUNTER — Other Ambulatory Visit: Payer: Self-pay

## 2017-11-25 DIAGNOSIS — I429 Cardiomyopathy, unspecified: Secondary | ICD-10-CM

## 2017-11-25 NOTE — Telephone Encounter (Signed)
Patient advised of results of stress test at [facility]. Advised patient Dr. Dulce Sellar would discuss results in detail at next office visit. Patient verbalized understanding. No further questions.

## 2017-11-25 NOTE — Telephone Encounter (Signed)
-----   Message from Baldo Daub, MD sent at 11/25/2017  1:39 PM EDT ----- EF remains severely reduced, will discuss ICD at next visit

## 2017-12-02 ENCOUNTER — Telehealth: Payer: Self-pay | Admitting: Cardiology

## 2017-12-02 DIAGNOSIS — E872 Acidosis: Secondary | ICD-10-CM

## 2017-12-02 DIAGNOSIS — L97929 Non-pressure chronic ulcer of unspecified part of left lower leg with unspecified severity: Secondary | ICD-10-CM | POA: Diagnosis not present

## 2017-12-02 DIAGNOSIS — L03116 Cellulitis of left lower limb: Secondary | ICD-10-CM | POA: Diagnosis not present

## 2017-12-02 DIAGNOSIS — A419 Sepsis, unspecified organism: Secondary | ICD-10-CM | POA: Diagnosis not present

## 2017-12-02 DIAGNOSIS — Z720 Tobacco use: Secondary | ICD-10-CM

## 2017-12-02 DIAGNOSIS — I959 Hypotension, unspecified: Secondary | ICD-10-CM | POA: Diagnosis not present

## 2017-12-02 DIAGNOSIS — N179 Acute kidney failure, unspecified: Secondary | ICD-10-CM

## 2017-12-02 DIAGNOSIS — I739 Peripheral vascular disease, unspecified: Secondary | ICD-10-CM | POA: Diagnosis not present

## 2017-12-02 DIAGNOSIS — D72829 Elevated white blood cell count, unspecified: Secondary | ICD-10-CM

## 2017-12-02 NOTE — Telephone Encounter (Signed)
Wants to talk about his BP

## 2017-12-02 NOTE — Telephone Encounter (Signed)
Blood pressure averaging below 100. Top numbers. 84, 89, 86, 85 some 90s, a few a little over 100. Today 93/63, he does have a headache in the back of his head. Patient is not dizzy, lightheaded. Patient is very weak and was unable to do physical therapy. Physical therapist checked O2 sat, unable to register. Home health nurse coming today at 4 pm and will recheck.  Please advise.  Please return call to home number. [phone]

## 2017-12-03 ENCOUNTER — Telehealth: Payer: Self-pay | Admitting: Cardiology

## 2017-12-03 ENCOUNTER — Encounter: Payer: PRIVATE HEALTH INSURANCE | Admitting: Vascular Surgery

## 2017-12-03 ENCOUNTER — Inpatient Hospital Stay
Admission: AD | Admit: 2017-12-03 | Payer: Self-pay | Source: Other Acute Inpatient Hospital | Admitting: Internal Medicine

## 2017-12-03 DIAGNOSIS — Z720 Tobacco use: Secondary | ICD-10-CM | POA: Diagnosis not present

## 2017-12-03 DIAGNOSIS — E872 Acidosis: Secondary | ICD-10-CM | POA: Diagnosis not present

## 2017-12-03 DIAGNOSIS — I739 Peripheral vascular disease, unspecified: Secondary | ICD-10-CM | POA: Diagnosis not present

## 2017-12-03 DIAGNOSIS — D72829 Elevated white blood cell count, unspecified: Secondary | ICD-10-CM | POA: Diagnosis not present

## 2017-12-03 DIAGNOSIS — N179 Acute kidney failure, unspecified: Secondary | ICD-10-CM | POA: Diagnosis not present

## 2017-12-03 DIAGNOSIS — L03116 Cellulitis of left lower limb: Secondary | ICD-10-CM | POA: Diagnosis not present

## 2017-12-03 DIAGNOSIS — A419 Sepsis, unspecified organism: Secondary | ICD-10-CM | POA: Diagnosis not present

## 2017-12-03 DIAGNOSIS — I959 Hypotension, unspecified: Secondary | ICD-10-CM | POA: Diagnosis not present

## 2017-12-03 DIAGNOSIS — L97929 Non-pressure chronic ulcer of unspecified part of left lower leg with unspecified severity: Secondary | ICD-10-CM | POA: Diagnosis not present

## 2017-12-03 NOTE — Telephone Encounter (Signed)
Patient is now in the hospital, please call spouse on tel# provided.

## 2017-12-03 NOTE — Telephone Encounter (Signed)
Patient's wife states that when the home health nurse arrived yesterday afternoon at 4 pm the patient's blood pressures were 70/38 and 60 on top, could not remember bottom number from that one. Heart rate was 111. The home health nurse convinced the patient to go to the emergency department. Patient was taken to [facility] and is now admitted to the ICU. Wife states that patient's legs are much worse and patient is now septic which they believe came from his legs. Patient had an initial consult scheduled for today with Dr. Arbie Cookey, vascular surgeon. Wife called their office to reschedule and patient was scheduled for 12/31/17.   Reviewed all above information with Dr. Dulce Sellar. Dr. Dulce Sellar advised to return call to patient's wife to ask her to have the patient transferred to [facility]. Advised that the reasoning would be for the patient to be evaluated by vascular surgeon inpatient due to legs getting worse and now source of infection. Wife verbalized understanding and will go to the hospital this morning and request to speak to the nurse and the physician caring for him today to have the patient transferred to [facility]. No further questions.

## 2017-12-04 DIAGNOSIS — N179 Acute kidney failure, unspecified: Secondary | ICD-10-CM | POA: Diagnosis not present

## 2017-12-04 DIAGNOSIS — D72829 Elevated white blood cell count, unspecified: Secondary | ICD-10-CM | POA: Diagnosis not present

## 2017-12-04 DIAGNOSIS — E872 Acidosis: Secondary | ICD-10-CM | POA: Diagnosis not present

## 2017-12-04 DIAGNOSIS — Z720 Tobacco use: Secondary | ICD-10-CM | POA: Diagnosis not present

## 2017-12-05 ENCOUNTER — Telehealth: Payer: Self-pay | Admitting: Cardiology

## 2017-12-05 DIAGNOSIS — D72829 Elevated white blood cell count, unspecified: Secondary | ICD-10-CM | POA: Diagnosis not present

## 2017-12-05 DIAGNOSIS — Z720 Tobacco use: Secondary | ICD-10-CM | POA: Diagnosis not present

## 2017-12-05 DIAGNOSIS — N179 Acute kidney failure, unspecified: Secondary | ICD-10-CM | POA: Diagnosis not present

## 2017-12-05 DIAGNOSIS — E872 Acidosis: Secondary | ICD-10-CM | POA: Diagnosis not present

## 2017-12-05 NOTE — Telephone Encounter (Signed)
Dr Dulce Sellar informed.

## 2017-12-05 NOTE — Telephone Encounter (Signed)
Checked himself out of the hospital AMA-FYI

## 2017-12-06 ENCOUNTER — Emergency Department (HOSPITAL_COMMUNITY): Payer: PRIVATE HEALTH INSURANCE

## 2017-12-06 ENCOUNTER — Encounter (HOSPITAL_COMMUNITY): Payer: Self-pay | Admitting: Emergency Medicine

## 2017-12-06 ENCOUNTER — Other Ambulatory Visit: Payer: Self-pay

## 2017-12-06 ENCOUNTER — Inpatient Hospital Stay (HOSPITAL_COMMUNITY)
Admission: EM | Admit: 2017-12-06 | Discharge: 2017-12-15 | DRG: 853 | Disposition: A | Discharge status: Home Health | Payer: PRIVATE HEALTH INSURANCE | Attending: Internal Medicine | Admitting: Internal Medicine

## 2017-12-06 DIAGNOSIS — L03116 Cellulitis of left lower limb: Secondary | ICD-10-CM | POA: Diagnosis present

## 2017-12-06 DIAGNOSIS — Z8249 Family history of ischemic heart disease and other diseases of the circulatory system: Secondary | ICD-10-CM

## 2017-12-06 DIAGNOSIS — I110 Hypertensive heart disease with heart failure: Secondary | ICD-10-CM | POA: Diagnosis present

## 2017-12-06 DIAGNOSIS — E785 Hyperlipidemia, unspecified: Secondary | ICD-10-CM | POA: Diagnosis present

## 2017-12-06 DIAGNOSIS — N179 Acute kidney failure, unspecified: Secondary | ICD-10-CM | POA: Diagnosis present

## 2017-12-06 DIAGNOSIS — I96 Gangrene, not elsewhere classified: Secondary | ICD-10-CM | POA: Diagnosis not present

## 2017-12-06 DIAGNOSIS — L97922 Non-pressure chronic ulcer of unspecified part of left lower leg with fat layer exposed: Secondary | ICD-10-CM | POA: Diagnosis present

## 2017-12-06 DIAGNOSIS — I70262 Atherosclerosis of native arteries of extremities with gangrene, left leg: Secondary | ICD-10-CM | POA: Diagnosis present

## 2017-12-06 DIAGNOSIS — E440 Moderate protein-calorie malnutrition: Secondary | ICD-10-CM | POA: Diagnosis present

## 2017-12-06 DIAGNOSIS — Z880 Allergy status to penicillin: Secondary | ICD-10-CM

## 2017-12-06 DIAGNOSIS — L0390 Cellulitis, unspecified: Secondary | ICD-10-CM | POA: Diagnosis not present

## 2017-12-06 DIAGNOSIS — L03115 Cellulitis of right lower limb: Secondary | ICD-10-CM

## 2017-12-06 DIAGNOSIS — F17210 Nicotine dependence, cigarettes, uncomplicated: Secondary | ICD-10-CM | POA: Diagnosis present

## 2017-12-06 DIAGNOSIS — E039 Hypothyroidism, unspecified: Secondary | ICD-10-CM | POA: Diagnosis present

## 2017-12-06 DIAGNOSIS — I420 Dilated cardiomyopathy: Secondary | ICD-10-CM | POA: Diagnosis present

## 2017-12-06 DIAGNOSIS — Z8349 Family history of other endocrine, nutritional and metabolic diseases: Secondary | ICD-10-CM

## 2017-12-06 DIAGNOSIS — I340 Nonrheumatic mitral (valve) insufficiency: Secondary | ICD-10-CM | POA: Diagnosis present

## 2017-12-06 DIAGNOSIS — Z79899 Other long term (current) drug therapy: Secondary | ICD-10-CM

## 2017-12-06 DIAGNOSIS — L89159 Pressure ulcer of sacral region, unspecified stage: Secondary | ICD-10-CM | POA: Diagnosis not present

## 2017-12-06 DIAGNOSIS — I5022 Chronic systolic (congestive) heart failure: Secondary | ICD-10-CM | POA: Diagnosis present

## 2017-12-06 DIAGNOSIS — A419 Sepsis, unspecified organism: Secondary | ICD-10-CM | POA: Diagnosis not present

## 2017-12-06 DIAGNOSIS — N1330 Unspecified hydronephrosis: Secondary | ICD-10-CM | POA: Diagnosis present

## 2017-12-06 DIAGNOSIS — Z7984 Long term (current) use of oral hypoglycemic drugs: Secondary | ICD-10-CM | POA: Diagnosis not present

## 2017-12-06 DIAGNOSIS — K220 Achalasia of cardia: Secondary | ICD-10-CM | POA: Diagnosis not present

## 2017-12-06 DIAGNOSIS — Z9119 Patient's noncompliance with other medical treatment and regimen: Secondary | ICD-10-CM | POA: Diagnosis not present

## 2017-12-06 DIAGNOSIS — I5021 Acute systolic (congestive) heart failure: Secondary | ICD-10-CM | POA: Diagnosis not present

## 2017-12-06 DIAGNOSIS — I872 Venous insufficiency (chronic) (peripheral): Secondary | ICD-10-CM | POA: Diagnosis present

## 2017-12-06 DIAGNOSIS — Z7982 Long term (current) use of aspirin: Secondary | ICD-10-CM

## 2017-12-06 DIAGNOSIS — G629 Polyneuropathy, unspecified: Secondary | ICD-10-CM | POA: Diagnosis present

## 2017-12-06 DIAGNOSIS — E43 Unspecified severe protein-calorie malnutrition: Secondary | ICD-10-CM

## 2017-12-06 DIAGNOSIS — L89154 Pressure ulcer of sacral region, stage 4: Secondary | ICD-10-CM | POA: Diagnosis present

## 2017-12-06 DIAGNOSIS — Z881 Allergy status to other antibiotic agents status: Secondary | ICD-10-CM

## 2017-12-06 DIAGNOSIS — L8990 Pressure ulcer of unspecified site, unspecified stage: Secondary | ICD-10-CM

## 2017-12-06 DIAGNOSIS — Z792 Long term (current) use of antibiotics: Secondary | ICD-10-CM | POA: Diagnosis not present

## 2017-12-06 DIAGNOSIS — F329 Major depressive disorder, single episode, unspecified: Secondary | ICD-10-CM | POA: Diagnosis present

## 2017-12-06 DIAGNOSIS — I739 Peripheral vascular disease, unspecified: Secondary | ICD-10-CM | POA: Diagnosis not present

## 2017-12-06 DIAGNOSIS — Z6828 Body mass index (BMI) 28.0-28.9, adult: Secondary | ICD-10-CM

## 2017-12-06 DIAGNOSIS — N320 Bladder-neck obstruction: Secondary | ICD-10-CM | POA: Diagnosis present

## 2017-12-06 DIAGNOSIS — I509 Heart failure, unspecified: Secondary | ICD-10-CM | POA: Diagnosis not present

## 2017-12-06 DIAGNOSIS — Z79890 Hormone replacement therapy (postmenopausal): Secondary | ICD-10-CM

## 2017-12-06 HISTORY — DX: Sepsis, unspecified organism: A41.9

## 2017-12-06 LAB — COMPREHENSIVE METABOLIC PANEL
ALK PHOS: 70 U/L (ref 38–126)
ALT: 21 U/L (ref 17–63)
AST: 25 U/L (ref 15–41)
Albumin: 2.8 g/dL — ABNORMAL LOW (ref 3.5–5.0)
Anion gap: 13 (ref 5–15)
BUN: 60 mg/dL — AB (ref 6–20)
CALCIUM: 9 mg/dL (ref 8.9–10.3)
CO2: 18 mmol/L — AB (ref 22–32)
CREATININE: 1.73 mg/dL — AB (ref 0.61–1.24)
Chloride: 104 mmol/L (ref 101–111)
GFR calc Af Amer: 48 mL/min — ABNORMAL LOW (ref >60)
GFR, EST NON AFRICAN AMERICAN: 41 mL/min — AB (ref >60)
Glucose, Bld: 102 mg/dL — ABNORMAL HIGH (ref 65–99)
Potassium: 4.3 mmol/L (ref 3.5–5.1)
Sodium: 135 mmol/L (ref 135–145)
Total Bilirubin: 0.6 mg/dL (ref 0.3–1.2)
Total Protein: 7.8 g/dL (ref 6.5–8.1)

## 2017-12-06 LAB — CBC WITH DIFFERENTIAL/PLATELET
Abs Immature Granulocytes: 0.1 10*3/uL (ref 0.0–0.1)
Basophils Absolute: 0 10*3/uL (ref 0.0–0.1)
Basophils Relative: 0 %
EOS ABS: 0 10*3/uL (ref 0.0–0.7)
Eosinophils Relative: 0 %
HEMATOCRIT: 39.1 % (ref 39.0–52.0)
Hemoglobin: 13.2 g/dL (ref 13.0–17.0)
Immature Granulocytes: 1 %
LYMPHS ABS: 0.6 10*3/uL — AB (ref 0.7–4.0)
Lymphocytes Relative: 4 %
MCH: 31.1 pg (ref 26.0–34.0)
MCHC: 33.8 g/dL (ref 30.0–36.0)
MCV: 92 fL (ref 78.0–100.0)
MONOS PCT: 7 %
Monocytes Absolute: 1.2 10*3/uL — ABNORMAL HIGH (ref 0.1–1.0)
Neutro Abs: 15.7 10*3/uL — ABNORMAL HIGH (ref 1.7–7.7)
Neutrophils Relative %: 88 %
Platelets: 225 10*3/uL (ref 150–400)
RBC: 4.25 MIL/uL (ref 4.22–5.81)
RDW: 14.6 % (ref 11.5–15.5)
WBC: 17.7 10*3/uL — ABNORMAL HIGH (ref 4.0–10.5)

## 2017-12-06 LAB — URINALYSIS, ROUTINE W REFLEX MICROSCOPIC
BACTERIA UA: NONE SEEN
Bilirubin Urine: NEGATIVE
GLUCOSE, UA: NEGATIVE mg/dL
KETONES UR: NEGATIVE mg/dL
LEUKOCYTES UA: NEGATIVE
NITRITE: NEGATIVE
PROTEIN: NEGATIVE mg/dL
Specific Gravity, Urine: 1.017 (ref 1.005–1.030)
pH: 5 (ref 5.0–8.0)

## 2017-12-06 LAB — I-STAT CG4 LACTIC ACID, ED: LACTIC ACID, VENOUS: 1.74 mmol/L (ref 0.5–1.9)

## 2017-12-06 MED ORDER — SPIRONOLACTONE 25 MG PO TABS
25.0000 mg | ORAL_TABLET | Freq: Every day | ORAL | Status: DC
  Administered 2017-12-07: 25 mg via ORAL
  Filled 2017-12-06: qty 1

## 2017-12-06 MED ORDER — ATORVASTATIN CALCIUM 40 MG PO TABS
40.0000 mg | ORAL_TABLET | Freq: Every day | ORAL | Status: DC
  Administered 2017-12-07 – 2017-12-15 (×8): 40 mg via ORAL
  Filled 2017-12-06 (×8): qty 1

## 2017-12-06 MED ORDER — HYDROCODONE-ACETAMINOPHEN 5-325 MG PO TABS
1.0000 | ORAL_TABLET | ORAL | Status: DC | PRN
  Administered 2017-12-08: 1 via ORAL
  Administered 2017-12-10 – 2017-12-13 (×3): 2 via ORAL
  Filled 2017-12-06: qty 1
  Filled 2017-12-06 (×3): qty 2

## 2017-12-06 MED ORDER — CARVEDILOL 3.125 MG PO TABS
3.1250 mg | ORAL_TABLET | Freq: Two times a day (BID) | ORAL | Status: DC
  Administered 2017-12-07 – 2017-12-14 (×14): 3.125 mg via ORAL
  Filled 2017-12-06 (×16): qty 1

## 2017-12-06 MED ORDER — ONDANSETRON HCL 4 MG/2ML IJ SOLN: 4.0000 mg | Freq: Four times a day (QID) | INTRAMUSCULAR | Status: DC | PRN

## 2017-12-06 MED ORDER — AZTREONAM 1 G IJ SOLR
1.0000 g | Freq: Three times a day (TID) | INTRAMUSCULAR | Status: DC
  Filled 2017-12-06 (×2): qty 1

## 2017-12-06 MED ORDER — LEVOTHYROXINE SODIUM 100 MCG PO TABS
100.0000 ug | ORAL_TABLET | Freq: Every day | ORAL | Status: DC
  Administered 2017-12-07 – 2017-12-15 (×9): 100 ug via ORAL
  Filled 2017-12-06 (×9): qty 1

## 2017-12-06 MED ORDER — ONDANSETRON HCL 4 MG PO TABS: 4.0000 mg | ORAL_TABLET | Freq: Four times a day (QID) | ORAL | Status: DC | PRN

## 2017-12-06 MED ORDER — FUROSEMIDE 20 MG PO TABS
20.0000 mg | ORAL_TABLET | Freq: Every day | ORAL | Status: DC
  Administered 2017-12-07: 20 mg via ORAL
  Filled 2017-12-06: qty 1

## 2017-12-06 MED ORDER — ENOXAPARIN SODIUM 40 MG/0.4ML ~~LOC~~ SOLN
40.0000 mg | Freq: Every day | SUBCUTANEOUS | Status: DC
  Administered 2017-12-07 – 2017-12-15 (×8): 40 mg via SUBCUTANEOUS
  Filled 2017-12-06 (×8): qty 0.4

## 2017-12-06 MED ORDER — VANCOMYCIN HCL 10 G IV SOLR
2000.0000 mg | Freq: Once | INTRAVENOUS | Status: AC
  Administered 2017-12-06: 2000 mg via INTRAVENOUS
  Filled 2017-12-06: qty 2000

## 2017-12-06 MED ORDER — ASPIRIN EC 81 MG PO TBEC
81.0000 mg | DELAYED_RELEASE_TABLET | Freq: Every day | ORAL | Status: DC
  Administered 2017-12-07 – 2017-12-15 (×8): 81 mg via ORAL
  Filled 2017-12-06 (×8): qty 1

## 2017-12-06 MED ORDER — MAGNESIUM CITRATE PO SOLN: 1.0000 | Freq: Once | ORAL | Status: DC | PRN

## 2017-12-06 MED ORDER — SODIUM CHLORIDE 0.9 % IV BOLUS
500.0000 mL | Freq: Once | INTRAVENOUS | Status: AC
  Administered 2017-12-06: 500 mL via INTRAVENOUS

## 2017-12-06 MED ORDER — SODIUM CHLORIDE 0.9 % IV SOLN
INTRAVENOUS | Status: DC
  Administered 2017-12-07 (×2): via INTRAVENOUS

## 2017-12-06 MED ORDER — SENNOSIDES-DOCUSATE SODIUM 8.6-50 MG PO TABS
1.0000 | ORAL_TABLET | Freq: Every evening | ORAL | Status: DC | PRN
Start: 2017-12-06 — End: 2017-12-15

## 2017-12-06 MED ORDER — BISACODYL 5 MG PO TBEC: 5.0000 mg | DELAYED_RELEASE_TABLET | Freq: Every day | ORAL | Status: DC | PRN

## 2017-12-06 MED ORDER — ENSURE ENLIVE PO LIQD
237.0000 mL | Freq: Two times a day (BID) | ORAL | Status: DC
  Administered 2017-12-10 – 2017-12-15 (×9): 237 mL via ORAL

## 2017-12-06 MED ORDER — CLINDAMYCIN PHOSPHATE 600 MG/50ML IV SOLN
600.0000 mg | Freq: Once | INTRAVENOUS | Status: AC
  Administered 2017-12-06: 600 mg via INTRAVENOUS
  Filled 2017-12-06: qty 50

## 2017-12-06 MED ORDER — DULOXETINE HCL 30 MG PO CPEP
30.0000 mg | ORAL_CAPSULE | Freq: Every day | ORAL | Status: DC
  Administered 2017-12-07 – 2017-12-15 (×8): 30 mg via ORAL
  Filled 2017-12-06 (×9): qty 1

## 2017-12-06 MED ORDER — VANCOMYCIN HCL IN DEXTROSE 750-5 MG/150ML-% IV SOLN
750.0000 mg | Freq: Two times a day (BID) | INTRAVENOUS | Status: DC
Start: 2017-12-07 — End: 2017-12-07
  Filled 2017-12-06: qty 150

## 2017-12-06 NOTE — ED Notes (Signed)
Attempted report 

## 2017-12-06 NOTE — ED Triage Notes (Signed)
Patient was at [facility]. Was told he needed to be transferred to [facility] for evaluation of a wound infection in both lower extremities.  Patient states he was waiting for the hospital to transport him but due to a disagreement in [facility] hospital policy the patient left and his family drove him to [facility].

## 2017-12-06 NOTE — H&P (Signed)
History and Physical   [facility] [place] Admission History and Physical [employer], D.O.    Patient Name: Peter Cochran MR#: [id number] Date of Birth: Nov 07, 1955 Date of Admission: 12/06/2017  Referring MD/NP/PA: PA Geiple Primary Care Physician: Floria Raveling, FNP  Chief Complaint:  Chief Complaint  Patient presents with  . Wound Infection    HPI: Peter Cochran is a 62 y.o. male with a known history of CHF, PAD,  presents to the emergency department for evaluation of wound infection.  Patient was an inpatient at [facility] for sepsis 2/2 wound infections (admitted 12/02/17) but left AMA this evening and drove himself to [facility].  The plan was to transport him by ambulance for a wound/vascular consult but he could not tolerated being transported with his legs elevated.   He reports worsening of his bilateral lower extremity edema, worsening left 1st and 2nd toes which have become black. He has been under the care of wound care.   He complains of severe pain to bilateral lower extremities which is worse with movement.   From EDP note: Records obtained show recent ABI of 1.7 on the right and 0.74 on the left. Patient has been having upward trending creatinine.  Most recent labs show WBC 16.3, hemoglobin 12.1, creatinine of 0.8 -> 1.3 -> 1.9.  Patient was being treated with aztreonam, clindamycin, and vancomycin.  He had imaging of the right and left tib-fib which were negative.  Patient denies fevers/chills, weakness, dizziness, chest pain, shortness of breath, N/V/C/D, abdominal pain, dysuria/frequency, changes in mental status.    Otherwise there has been no change in status.   EMS/ED Course: Patient received azactam, vanco, clinda, NS. Medical admission has been requested for further management of bilateral lower extremity cellulitis, gangrene of the toe of the left foot, AKI.  Review of Systems:  CONSTITUTIONAL: No fever/chills, fatigue,  weakness, weight gain/loss, headache. EYES: No blurry or double vision. ENT: No tinnitus, postnasal drip, redness or soreness of the oropharynx. RESPIRATORY: No cough, dyspnea, wheeze.  No hemoptysis.  CARDIOVASCULAR: No chest pain, palpitations, syncope, orthopnea. GASTROINTESTINAL: No nausea, vomiting, abdominal pain, diarrhea, constipation.  No hematemesis, melena or hematochezia. GENITOURINARY: No dysuria, frequency, hematuria. ENDOCRINE: No polyuria or nocturia. No heat or cold intolerance. HEMATOLOGY: No anemia, bruising, bleeding. INTEGUMENTARY: No rashes, ulcers, lesions. MUSCULOSKELETAL: Positive bilateral lower extremity pain and swelling. No arthritis, gout NEUROLOGIC: No numbness, tingling, ataxia, seizure-type activity, weakness. PSYCHIATRIC: No anxiety, depression, insomnia.   Past Medical History:  Diagnosis Date  . Cellulitis    mild.  L leg  . CHF (congestive heart failure) (HCC)    Dr. Norman Herrlich  . 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  . Hand fracture, right    Dr. Linda Hedges  . Hypothyroidism (acquired)   . Leg ulcer, left (HCC)   . PAD (peripheral artery disease) (HCC)   . Periorbital hematoma, right   . Peripheral neuropathy   . Toe ulcer (HCC)    Left  . Wound of left leg    Non-healing. Dr. Liston Alba, Dr. Bynum Bellows.    History reviewed. No pertinent surgical history.   reports that he has been smoking cigarettes.  He has been smoking about 2.00 packs per day. He has never used smokeless tobacco. He reports that he drank alcohol. He reports that he has current or past drug history.  Allergies  Allergen Reactions  . Oxytetracycline   . Penicillins   .  Sulfa Antibiotics Rash    Family History  Problem Relation Age of Onset  . Hypertension Mother   . Hyperlipidemia Mother   . Heart attack Father   . Diabetes Father   . Hypertension Father   . Hyperlipidemia Father   . Stroke Maternal Grandmother     Prior to Admission  medications   Medication Sig Start Date End Date Taking? Authorizing Provider  aspirin EC 81 MG tablet Take 81 mg by mouth daily.    [provider]  atorvastatin (LIPITOR) 40 MG tablet Take 1 tablet (40 mg total) by mouth daily. 11/20/17   Baldo Daub, MD  carvedilol (COREG) 3.125 MG tablet Take 1 tablet (3.125 mg total) by mouth 2 (two) times daily with a meal. 11/20/17   Munley, Iline Oven, MD  DULoxetine (CYMBALTA) 30 MG capsule Take 30 mg by mouth daily. 10/23/17   [provider]  furosemide (LASIX) 20 MG tablet Take 1 tablet (20 mg total) by mouth daily. 10/28/17 01/26/18  Baldo Daub, MD  HYDROcodone-acetaminophen (NORCO/VICODIN) 5-325 MG tablet Take 1 tablet by mouth every 4 (four) hours as needed for pain. 10/30/17   [provider]  levothyroxine (SYNTHROID, LEVOTHROID) 100 MCG tablet Take 100 mcg by mouth daily before breakfast.     [provider]  sacubitril-valsartan (ENTRESTO) 24-26 MG Take 1 tablet by mouth 3 (three) times a week. 11/13/17   Baldo Daub, MD  spironolactone (ALDACTONE) 25 MG tablet Take 1 tablet (25 mg total) by mouth daily. 10/28/17   Baldo Daub, MD  Vitamin D, Ergocalciferol, (DRISDOL) 50000 units CAPS capsule Take 50,000 Units by mouth every 7 (seven) days.    [provider]    Physical Exam: Vitals:   12/06/17 1421 12/06/17 1639 12/06/17 1922 12/06/17 1930  BP: 98/67 93/69 102/63 (!) 103/93  Pulse: (!) 108 (!) 107 (!) 114 (!) 124  Resp: Temp: (!) 97.5 F (36.4 C) 97.8 F (36.6 C) 98.1 F (36.7 C)   TempSrc: Oral Oral Oral   SpO2: 100% 100% 100% 97%  Weight:   98.4 kg (217 lb)     GENERAL: 62 y.o.-year-old male patient, chronically ill appearing, sitting up in chair in no acute distress.  Pleasant and cooperative.   HEENT: Head atraumatic, normocephalic. Pupils equal. Mucus membranes moist. NECK: Supple. No JVD. CHEST: Normal breath sounds bilaterally. No wheezing, rales, rhonchi or  crackles. No use of accessory muscles of respiration.  No reproducible chest wall tenderness.  CARDIOVASCULAR: S1, S2 normal. No murmurs, rubs, or gallops. Cap refill <2 seconds. Pulses intact distally.  ABDOMEN: Soft, nondistended, nontender. No rebound, guarding, rigidity. N EXTREMITIES: Erythema, edema and warmth, scaling and discoloration from mid-calf distal bilaterally.   Weeping wounds.  Left 1st, 2nd and 3rd toes are gangrenous with surrounding discharge. Large ulceration of the left lateral ankle.  Hypertrophic toenails.  Pulses obtained with doppler.  NEUROLOGIC: The patient is alert and oriented x 3. PSYCHIATRIC:  Normal affect, mood, thought content.    Labs on Admission:  CBC: Recent Labs  Lab 12/06/17 1227  WBC 17.7*  NEUTROABS 15.7*  HGB 13.2  HCT 39.1  MCV 92.0  PLT 225   Basic Metabolic Panel: Recent Labs  Lab 12/06/17 1227  NA 135  K 4.3  CL 104  CO2 18*  GLUCOSE 102*  BUN 60*  CREATININE 1.73*  CALCIUM 9.0   GFR: Estimated Creatinine Clearance: 54.3 mL/min (A) (by C-G formula based on  SCr of 1.73 mg/dL (H)). Liver Function Tests: Recent Labs  Lab 12/06/17 1227  AST 25  ALT 21  ALKPHOS 70  BILITOT 0.6  PROT 7.8  ALBUMIN 2.8*   No results for input(s): LIPASE, AMYLASE in the last 168 hours. No results for input(s): AMMONIA in the last 168 hours. Coagulation Profile: No results for input(s): INR, PROTIME in the last 168 hours. Cardiac Enzymes: No results for input(s): CKTOTAL, CKMB, CKMBINDEX, TROPONINI in the last 168 hours. BNP (last 3 results) No results for input(s): PROBNP in the last 8760 hours. HbA1C: No results for input(s): HGBA1C in the last 72 hours. CBG: No results for input(s): GLUCAP in the last 168 hours. Lipid Profile: No results for input(s): CHOL, HDL, LDLCALC, TRIG, CHOLHDL, LDLDIRECT in the last 72 hours. Thyroid Function Tests: No results for input(s): TSH, T4TOTAL, FREET4, T3FREE, THYROIDAB in the last 72  hours. Anemia Panel: No results for input(s): VITAMINB12, FOLATE, FERRITIN, TIBC, IRON, RETICCTPCT in the last 72 hours. Urine analysis: No results found for: COLORURINE, APPEARANCEUR, LABSPEC, PHURINE, GLUCOSEU, HGBUR, BILIRUBINUR, KETONESUR, PROTEINUR, UROBILINOGEN, NITRITE, LEUKOCYTESUR Sepsis Labs: (procalcitonin:4,lacticidven:4) )No results found for this or any previous visit (from the past 240 hour(s)).   Radiological Exams on Admission: Dg Foot Complete Left  Result Date: 12/06/2017 CLINICAL DATA:  Legs are full of fluid EXAM: LEFT FOOT - COMPLETE 3+ VIEW COMPARISON:  10/19/2017 FINDINGS: No fracture or malalignment. Mild degenerative change at the first MTP joint. No periostitis or bone destruction. No soft tissue gas IMPRESSION: No acute osseous abnormality Electronically Signed   By: Jasmine Pang M.D.   On: 12/06/2017 20:21   Dg Foot Complete Right  Result Date: 12/06/2017 CLINICAL DATA:  Legs are full of fluid EXAM: RIGHT FOOT COMPLETE - 3+ VIEW COMPARISON:  12/02/2017 FINDINGS: No fracture or malalignment. Small plantar calcaneal spur. Soft tissue edema. No soft tissue gas or foreign body IMPRESSION: No acute osseous abnormality Electronically Signed   By: Jasmine Pang M.D.   On: 12/06/2017 20:22    Assessment/Plan  This is a 62 y.o. male with a history of CHF, PAD, now being admitted with:  #. Sepsis secondary to bilateral lower extremity cellulitis - Admit to inpatient with telemetry monitoring - IV antibiotics: Vanco, Azactam, Clinda - IV fluid hydration - Follow up cultures - Repeat CBC in am.  - Vascular surgery consultation has been requested by EDP.  #. Acute kidney injury  - IV fluids and repeat BMP in AM.  - Avoid nephrotoxic medications: Hold Entresto  #. 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 History of HLD - Continue Lipitor  #. History of HTN - Continue Coreg  #. History of CHF - Continue Lasix,  aldactone  #. History of hypothyroidism - Continue Synthroid  #. History of depression - Continue Cymbalta  Admission status: Inpatient IV Fluids: NS Diet/Nutrition: Heart healthy Consults called: Vascular DVT Px: Lovenox, SCDs and early ambulation. Code Status: Full Code  Disposition Plan: To home in 2-3 days  All the records are reviewed and case discussed with ED provider. Management plans discussed with the patient and/or family who express understanding and agree with plan of care.  Charlie Char D.O. on 12/06/2017 at 8:50 PM CC: Primary care physician; Floria Raveling, FNP   12/06/2017, 8:50 PM

## 2017-12-06 NOTE — Progress Notes (Signed)
Pharmacy Antibiotic Note  Peter Cochran is a 62 y.o. male admitted on 12/06/2017 with wound infection.  Pharmacy has been consulted for Vancomycin and Aztreonam dosing.  Patient has some renal insufficiency with an estimated creatinine clearance of 51ml/min.  His creatinine currently is 1.73, and WBC is elevated at 17.7.   I spoke with Mr. Cayson and he acknowledges sulfa and pcn allergies but no allergy to Vancomycin or Aztreonam.    Plan: Vancomycin 2gm IV x 1 then begin maintenance of  every 12 hours Aztreonam 1gm IV every 8 hours. Will f/u renal function, culture data, clinical response. Will check s/s levels as appropriate  Weight: 217 lb (98.4 kg)(Per patient - weighed 5/23)  Temp (24hrs), Avg:97.8 F (36.6 C), Min:97.5 F (36.4 C), Max:98.1 F (36.7 C)  Recent Labs  Lab 12/06/17 1227 12/06/17 1245  WBC 17.7*  --   CREATININE 1.73*  --   LATICACIDVEN  --  1.74    Estimated Creatinine Clearance: 54.3 mL/min (A) (by C-G formula based on SCr of 1.73 mg/dL (H)).    Allergies  Allergen Reactions  . Oxytetracycline   . Penicillins   . Sulfa Antibiotics Rash    Antimicrobials this admission: Vancomycin 5/24>> Aztreonam 5/24 >> Clindamycin 5/24 >>  Nadara Mustard, PharmD., MS Clinical Pharmacist Pager:  [phone] Thank you for allowing pharmacy to be part of this patients care team. 12/06/2017 7:33 PM

## 2017-12-06 NOTE — ED Notes (Signed)
Patient transported to X-ray 

## 2017-12-06 NOTE — ED Provider Notes (Signed)
[facility] EMERGENCY DEPARTMENT Provider Note   CSN: [id number] Arrival date & time: 12/06/17  1138     History   Chief Complaint Chief Complaint  Patient presents with  . Wound Infection    HPI Peter Cochran is a 62 y.o. male.  Patient with history of chronic leg wounds, chronic heart failure, peripheral arterial disease, peripheral neuropathy gangrene --presents with worsening of leg pain and wounds.  Patient left [facility] AGAINST MEDICAL ADVICE today.  He was admitted on 5/20 for sepsis secondary to infection in the legs.  He was found to have low blood pressures by his home health nurse and was sent to the hospital.  The plan was to send the patient to [name] for vascular work-up.  Patient however could not tolerate getting onto an ambulance without his legs dangling due to pain and refused transport, signed out AGAINST MEDICAL ADVICE, and drove himself to [name] where he is seen in the emergency department.  Patient is currently under the care of wound care.  He has noted that the toes on his left foot have become dusky and then black over the past 6 weeks.  He is continued to have significant weeping of the legs.  Records obtained show recent ABI of 1.7 on the right and 0.74 on the left.  Patient has been having upward trending creatinine.  Most recent labs show WBC 16.3, hemoglobin 12.1, creatinine of 0.8 -> 1.3 -> 1.9.  Patient was being treated with aztreonam, clindamycin, and vancomycin.  He had imaging of the right and left tib-fib which were negative.  Patient otherwise denies current fevers, nausea or vomiting.  No chest pain or abdominal pain. The onset of this condition was acute. The course is acute on chronic. Aggravating factors: raising legs. Alleviating factors: none.       Past Medical History:  Diagnosis Date  . Cellulitis    mild.  L leg  . CHF (congestive heart failure) (HCC)    Dr. Norman Herrlich  . 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  . Hand fracture, right    Dr. Linda Hedges  . Hypothyroidism (acquired)   . Leg ulcer, left (HCC)   . PAD (peripheral artery disease) (HCC)   . Periorbital hematoma, right   . Peripheral neuropathy   . Toe ulcer (HCC)    Left  . Wound of left leg    Non-healing. Dr. Liston Alba, Dr. Bynum Bellows.    Patient Active Problem List   Diagnosis Date Noted  . Chronic systolic heart failure (HCC) 10/28/2017  . Dilated cardiomyopathy (HCC) 10/27/2017  . Mitral regurgitation 10/27/2017  . Elevated troponin 10/27/2017  . Elevated brain natriuretic peptide (BNP) level 10/27/2017  . PAD (peripheral artery disease) (HCC) 10/27/2017  . Leg ulcer, left, with fat layer exposed (HCC) 10/27/2017  . Fatigue 10/22/2017    History reviewed. No pertinent surgical history.      Home Medications    Prior to Admission medications   Medication Sig Start Date End Date Taking? Authorizing Provider  aspirin EC 81 MG tablet Take 81 mg by mouth daily.    [provider]  atorvastatin (LIPITOR) 40 MG tablet Take 1 tablet (40 mg total) by mouth daily. 11/20/17   Baldo Daub, MD  carvedilol (COREG) 3.125 MG tablet Take 1 tablet (3.125 mg total) by mouth 2 (two) times daily with a meal. 11/20/17   Dulce Sellar, Iline Oven, MD  DULoxetine (CYMBALTA) 30 MG  capsule Take 30 mg by mouth daily. 10/23/17   [provider]  furosemide (LASIX) 20 MG tablet Take 1 tablet (20 mg total) by mouth daily. 10/28/17 01/26/18  Baldo Daub, MD  HYDROcodone-acetaminophen (NORCO/VICODIN) 5-325 MG tablet Take 1 tablet by mouth every 4 (four) hours as needed for pain. 10/30/17   [provider]  levothyroxine (SYNTHROID, LEVOTHROID) 100 MCG tablet Take 100 mcg by mouth daily before breakfast.     [provider]  sacubitril-valsartan (ENTRESTO) 24-26 MG Take 1 tablet by mouth 3 (three) times a week. 11/13/17   Baldo Daub, MD  spironolactone (ALDACTONE) 25 MG tablet Take 1  tablet (25 mg total) by mouth daily. 10/28/17   Baldo Daub, MD  Vitamin D, Ergocalciferol, (DRISDOL) 50000 units CAPS capsule Take 50,000 Units by mouth every 7 (seven) days.    [provider]    Family History Family History  Problem Relation Age of Onset  . Hypertension Mother   . Hyperlipidemia Mother   . Heart attack Father   . Diabetes Father   . Hypertension Father   . Hyperlipidemia Father   . Stroke Maternal Grandmother     Social History Social History   Tobacco Use  . Smoking status: Current Every Day Smoker    Packs/day: 2.00    Types: Cigarettes  . Smokeless tobacco: Never Used  Substance Use Topics  . Alcohol use: Not Currently  . Drug use: Not Currently     Allergies   Oxytetracycline; Penicillins; and Sulfa antibiotics   Review of Systems Review of Systems  Constitutional: Negative for fever.  HENT: Negative for rhinorrhea and sore throat.   Eyes: Negative for redness.  Respiratory: Negative for cough.   Cardiovascular: Positive for leg swelling. Negative for chest pain.  Gastrointestinal: Negative for abdominal pain, diarrhea, nausea and vomiting.  Genitourinary: Negative for dysuria.  Musculoskeletal: Positive for myalgias.  Skin: Positive for wound. Negative for rash.  Neurological: Negative for headaches.     Physical Exam Updated Vital Signs BP 93/69 (BP Location: Right Arm)   Pulse (!) 107   Temp 97.8 F (36.6 C) (Oral)   Resp 16   SpO2 100%   Physical Exam  Constitutional: He appears well-developed and well-nourished.  HENT:  Head: Normocephalic and atraumatic.  Eyes: Conjunctivae are normal. Right eye exhibits no discharge. Left eye exhibits no discharge.  Neck: Normal range of motion. Neck supple.  Cardiovascular: Normal rate, regular rhythm and normal heart sounds.  Pulmonary/Chest: Effort normal and breath sounds normal.  Abdominal: Soft. There is no tenderness.  Musculoskeletal: He exhibits edema and  tenderness.  R lower leg: Rubor with excessive weeping, warm, tender from proximal calf to toes.   L lower leg: Gangrene of great, 2nd, and 3rd tips of toes noted with discharge. Lateral open ulceration. Rubor with excessive weeping, warm, tender from proximal calf to toes.   Doppler used to identify DP pulses bilateral without much difficulty.   Neurological: He is alert.  Skin: Skin is warm and dry.  Psychiatric: He has a normal mood and affect.  Nursing note and vitals reviewed.          ED Treatments / Results  Labs (all labs ordered are listed, but only abnormal results are displayed) Labs Reviewed  COMPREHENSIVE METABOLIC PANEL - Abnormal; Notable for the following components:      Result Value   CO2 18 (*)    Glucose, Bld 102 (*)    BUN 60 (*)  Creatinine, Ser 1.73 (*)    Albumin 2.8 (*)    GFR calc non Af Amer 41 (*)    GFR calc Af Amer 48 (*)    All other components within normal limits  CBC WITH DIFFERENTIAL/PLATELET - Abnormal; Notable for the following components:   WBC 17.7 (*)    Neutro Abs 15.7 (*)    Lymphs Abs 0.6 (*)    Monocytes Absolute 1.2 (*)    All other components within normal limits  URINALYSIS, ROUTINE W REFLEX MICROSCOPIC - Abnormal; Notable for the following components:   APPearance HAZY (*)    Hgb urine dipstick MODERATE (*)    All other components within normal limits  I-STAT CG4 LACTIC ACID, ED  I-STAT CG4 LACTIC ACID, ED    EKG None  Radiology Dg Foot Complete Left  Result Date: 12/06/2017 CLINICAL DATA:  Legs are full of fluid EXAM: LEFT FOOT - COMPLETE 3+ VIEW COMPARISON:  10/19/2017 FINDINGS: No fracture or malalignment. Mild degenerative change at the first MTP joint. No periostitis or bone destruction. No soft tissue gas IMPRESSION: No acute osseous abnormality Electronically Signed   By: Jasmine Pang M.D.   On: 12/06/2017 20:21   Dg Foot Complete Right  Result Date: 12/06/2017 CLINICAL DATA:  Legs are full of fluid  EXAM: RIGHT FOOT COMPLETE - 3+ VIEW COMPARISON:  12/02/2017 FINDINGS: No fracture or malalignment. Small plantar calcaneal spur. Soft tissue edema. No soft tissue gas or foreign body IMPRESSION: No acute osseous abnormality Electronically Signed   By: Jasmine Pang M.D.   On: 12/06/2017 20:22    Procedures Procedures (including critical care time)  Medications Ordered in ED Medications  sodium chloride 0.9 % bolus 500 mL (500 mLs Intravenous New Bag/Given 12/06/17 2119)  vancomycin (VANCOCIN) 2,000 mg in sodium chloride 0.9 % 500 mL IVPB (has no administration in time range)  vancomycin (VANCOCIN) IVPB 750 mg/150 ml premix (has no administration in time range)  aztreonam (AZACTAM) injection 1 g (has no administration in time range)  sodium chloride 0.9 % bolus 500 mL (500 mLs Intravenous New Bag/Given 12/06/17 2123)  clindamycin (CLEOCIN) IVPB 600 mg (600 mg Intravenous New Bag/Given 12/06/17 2119)     Initial Impression / Assessment and Plan / ED Course  I have reviewed the triage vital signs and the nursing notes.  Pertinent labs & imaging results that were available during my care of the patient were reviewed by me and considered in my medical decision making (see chart for details).     Patient seen and examined. Work-up initiated. Medications ordered.   Vital signs reviewed and are as follows: BP (!) 103/93   Pulse (!) 124   Temp 98.1 F (36.7 C) (Oral)   Resp 16   Wt 98.4 kg (217 lb) Comment: Per patient - weighed 5/23  SpO2 97%   BMI 30.27 kg/m   8:30 PM records obtained and reviewed.  Antibiotics initiated.  X-rays performed, no obvious osteomyelitis.  Will admit. Patient seen previously by Dr. Particia Nearing.   Tachycardia worsening. Additional fluids ordered. Will recheck.   Spoke with [facility] hospitalist who will see.   Angiocath insertion Performed by: Carolee Rota  Consent: Verbal consent obtained. Risks and benefits: risks, benefits and alternatives were  discussed Time out: Immediately prior to procedure a "time out" was called to verify the correct patient, procedure, equipment, support staff and site/side marked as required.  Preparation: Patient was prepped and draped in the usual sterile fashion.  Vein Location: R  AC  Ultrasound Guided  Gauge: 22  Normal blood return and flush without difficulty Patient tolerance: Patient tolerated the procedure well with no immediate complications.   9:59 PM Hospitalist requested I call vascular for reccs. Page placed. No callback to this point.     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   ED Discharge Orders    None       Renne Crigler, Cordelia Poche 12/06/17 2236    Jacalyn Lefevre, MD 12/06/17 2302

## 2017-12-06 NOTE — ED Notes (Signed)
Medical Records signed form sent to [place]-per Emmit Alexanders, PA-sent by Marylene Land

## 2017-12-07 ENCOUNTER — Encounter (HOSPITAL_COMMUNITY): Payer: PRIVATE HEALTH INSURANCE

## 2017-12-07 DIAGNOSIS — L8990 Pressure ulcer of unspecified site, unspecified stage: Secondary | ICD-10-CM

## 2017-12-07 DIAGNOSIS — L89159 Pressure ulcer of sacral region, unspecified stage: Secondary | ICD-10-CM

## 2017-12-07 DIAGNOSIS — I96 Gangrene, not elsewhere classified: Secondary | ICD-10-CM

## 2017-12-07 DIAGNOSIS — I739 Peripheral vascular disease, unspecified: Secondary | ICD-10-CM

## 2017-12-07 DIAGNOSIS — I509 Heart failure, unspecified: Secondary | ICD-10-CM

## 2017-12-07 HISTORY — DX: Pressure ulcer of unspecified site, unspecified stage: L89.90

## 2017-12-07 LAB — HEMOGLOBIN A1C
Hgb A1c MFr Bld: 6 % — ABNORMAL HIGH (ref 4.8–5.6)
Mean Plasma Glucose: 125.5 mg/dL

## 2017-12-07 LAB — COMPREHENSIVE METABOLIC PANEL
ALBUMIN: 2.4 g/dL — AB (ref 3.5–5.0)
ALT: 18 U/L (ref 17–63)
ANION GAP: 9 (ref 5–15)
AST: 22 U/L (ref 15–41)
Alkaline Phosphatase: 63 U/L (ref 38–126)
BUN: 51 mg/dL — ABNORMAL HIGH (ref 6–20)
CHLORIDE: 109 mmol/L (ref 101–111)
CO2: 17 mmol/L — AB (ref 22–32)
Calcium: 8.7 mg/dL — ABNORMAL LOW (ref 8.9–10.3)
Creatinine, Ser: 1.21 mg/dL (ref 0.61–1.24)
GFR calc non Af Amer: >60 mL/min (ref >60)
Glucose, Bld: 96 mg/dL (ref 65–99)
Potassium: 4.2 mmol/L (ref 3.5–5.1)
SODIUM: 135 mmol/L (ref 135–145)
Total Bilirubin: 0.6 mg/dL (ref 0.3–1.2)
Total Protein: 7.1 g/dL (ref 6.5–8.1)

## 2017-12-07 LAB — PROTIME-INR
INR: 1.11
Prothrombin Time: 14.2 seconds (ref 11.4–15.2)

## 2017-12-07 LAB — CREATININE, URINE, RANDOM: Creatinine, Urine: 109.21 mg/dL

## 2017-12-07 LAB — CBC
HCT: 35.2 % — ABNORMAL LOW (ref 39.0–52.0)
Hemoglobin: 11.6 g/dL — ABNORMAL LOW (ref 13.0–17.0)
MCH: 31.2 pg (ref 26.0–34.0)
MCHC: 33 g/dL (ref 30.0–36.0)
MCV: 94.6 fL (ref 78.0–100.0)
PLATELETS: 209 10*3/uL (ref 150–400)
RBC: 3.72 MIL/uL — AB (ref 4.22–5.81)
RDW: 15.2 % (ref 11.5–15.5)
WBC: 14.9 10*3/uL — ABNORMAL HIGH (ref 4.0–10.5)

## 2017-12-07 LAB — HIV ANTIBODY (ROUTINE TESTING W REFLEX): HIV SCREEN 4TH GENERATION: NONREACTIVE

## 2017-12-07 LAB — PREALBUMIN: PREALBUMIN: 10.8 mg/dL — AB (ref 18–38)

## 2017-12-07 LAB — SEDIMENTATION RATE: SED RATE: 78 mm/h — AB (ref 0–16)

## 2017-12-07 LAB — C-REACTIVE PROTEIN: CRP: 13.5 mg/dL — ABNORMAL HIGH (ref <1.0)

## 2017-12-07 LAB — SODIUM, URINE, RANDOM: Sodium, Ur: <10 mmol/L

## 2017-12-07 MED ORDER — JUVEN PO PACK
1.0000 | PACK | Freq: Two times a day (BID) | ORAL | Status: DC
  Administered 2017-12-10: 1 via ORAL
  Filled 2017-12-07 (×9): qty 1

## 2017-12-07 MED ORDER — ADULT MULTIVITAMIN W/MINERALS CH
1.0000 | ORAL_TABLET | Freq: Every day | ORAL | Status: DC
  Administered 2017-12-08 – 2017-12-15 (×7): 1 via ORAL
  Filled 2017-12-07 (×7): qty 1

## 2017-12-07 MED ORDER — SODIUM CHLORIDE 0.9 % IV SOLN
2.0000 g | Freq: Three times a day (TID) | INTRAVENOUS | Status: DC
  Administered 2017-12-07 – 2017-12-10 (×10): 2 g via INTRAVENOUS
  Filled 2017-12-07 (×12): qty 2

## 2017-12-07 MED ORDER — SODIUM CHLORIDE 0.9 % IV SOLN
1.0000 g | Freq: Three times a day (TID) | INTRAVENOUS | Status: DC
  Administered 2017-12-07 (×2): 1 g via INTRAVENOUS
  Filled 2017-12-07 (×3): qty 1

## 2017-12-07 NOTE — Progress Notes (Signed)
   Daily Progress Note   Case discussed with Dr. Maryfrances Bunnell.  Will see patient later.  Unfortunately, outside ABI without scanned waveforms.   Repeat BLE ABI  BLE venous reflux studies will need to be obtained, preferrably as an outpatient.   Leonides Sake, MD, FACS [facility] Office: [phone] Pager: [phone]  12/07/2017, 1:28 PM

## 2017-12-07 NOTE — Consult Note (Signed)
  Requested by:  Dr. Danford ([facility])  Reason for consultation: left foot gangrene   History of Present Illness   Peter Cochran is a 62 y.o. (01/01/1956) male who presents with chief complaint: "left toes black".  This patient has longstanding history medical non-compliance.  He discloses >2 years history of bilateral leg swelling with intermittent development of "blisters" which sound like venous stasis ulceration.  He reported has healed multiple episodes of such but over the last month, he develop blackening of his L 1st and 2nd toes.  Reported he get neuropathic burning in his heels which result in twitching.  He reported kicked a counter during one episode resulting in injury to the L 1st and 2nd toes.  This toes proceed to gangrenous transformation.  Pt currently denies any pain in his L foot.  He wants to go home.   Atherosclerotic risk factors include: active smoker.  Past Medical History:  Diagnosis Date  . Cellulitis    mild.  L leg  . CHF (congestive heart failure) (HCC)    Dr. Jaelyn Cloninger Munley  . 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  . Hand fracture, right    Dr. Jeffrey Yaste  . Hypothyroidism (acquired)   . Leg ulcer, left (HCC)   . PAD (peripheral artery disease) (HCC)   . Periorbital hematoma, right   . Peripheral neuropathy   . Toe ulcer (HCC)    Left  . Wound of left leg    Non-healing. Dr. Pedro Hernandez, Dr. Titorya Strover.    History reviewed. No pertinent surgical history.  Social History   Socioeconomic History  . Marital status: Married    Spouse name: Not on file  . Number of children: Not on file  . Years of education: Not on file  . Highest education level: Not on file  Occupational History  . Not on file  Social Needs  . Financial resource strain: Not on file  . Food insecurity:    Worry: Not on file    Inability: Not on file  . Transportation needs:    Medical: Not on file    Non-medical: Not on file  Tobacco Use  .  Smoking status: Current Every Day Smoker    Packs/day: 2.00    Types: Cigarettes  . Smokeless tobacco: Never Used  Substance and Sexual Activity  . Alcohol use: Not Currently  . Drug use: Not Currently  . Sexual activity: Not on file  Lifestyle  . Physical activity:    Days per week: Not on file    Minutes per session: Not on file  . Stress: Not on file  Relationships  . Social connections:    Talks on phone: Not on file    Gets together: Not on file    Attends religious service: Not on file    Active member of club or organization: Not on file    Attends meetings of clubs or organizations: Not on file    Relationship status: Not on file  . Intimate partner violence:    Fear of current or ex partner: Not on file    Emotionally abused: Not on file    Physically abused: Not on file    Forced sexual activity: Not on file  Other Topics Concern  . Not on file  Social History Narrative  . Not on file    Family History  Problem Relation Age of Onset  . Hypertension Mother   . Hyperlipidemia Mother   .   Heart attack Father   . Diabetes Father   . Hypertension Father   . Hyperlipidemia Father   . Stroke Maternal Grandmother     Current Facility-Administered Medications  Medication Dose Route Frequency Provider Last Rate Last Dose  . 0.9 %  sodium chloride infusion   Intravenous Continuous Danford, Christopher P, MD 75 mL/hr at 12/07/17 1535    . aspirin EC tablet 81 mg  81 mg Oral Daily Hugelmeyer, Alexis, DO   81 mg at 12/07/17 0954  . atorvastatin (LIPITOR) tablet 40 mg  40 mg Oral Daily Hugelmeyer, Alexis, DO   40 mg at 12/07/17 0954  . bisacodyl (DULCOLAX) EC tablet 5 mg  5 mg Oral Daily PRN Hugelmeyer, Alexis, DO      . carvedilol (COREG) tablet 3.125 mg  3.125 mg Oral BID WC Hugelmeyer, Alexis, DO   3.125 mg at 12/07/17 0803  . ceFEPIme (MAXIPIME) 2 g in sodium chloride 0.9 % 100 mL IVPB  2 g Intravenous Q8H Robertson, Crystal S, RPH   Stopped at 12/07/17 1608  .  DULoxetine (CYMBALTA) DR capsule 30 mg  30 mg Oral Daily Hugelmeyer, Alexis, DO   30 mg at 12/07/17 0954  . enoxaparin (LOVENOX) injection 40 mg  40 mg Subcutaneous Daily Hugelmeyer, Alexis, DO   40 mg at 12/07/17 0959  . feeding supplement (ENSURE ENLIVE) (ENSURE ENLIVE) liquid 237 mL  237 mL Oral BID BM Hugelmeyer, Alexis, DO      . HYDROcodone-acetaminophen (NORCO/VICODIN) 5-325 MG per tablet 1-2 tablet  1-2 tablet Oral Q4H PRN Hugelmeyer, Alexis, DO      . levothyroxine (SYNTHROID, LEVOTHROID) tablet 100 mcg  100 mcg Oral QAC breakfast Hugelmeyer, Alexis, DO   100 mcg at 12/07/17 0803  . magnesium citrate solution 1 Bottle  1 Bottle Oral Once PRN Hugelmeyer, Alexis, DO      . [START ON 12/08/2017] multivitamin with minerals tablet 1 tablet  1 tablet Oral Daily Danford, Christopher P, MD      . nutrition supplement (JUVEN) (JUVEN) powder packet 1 packet  1 packet Oral BID BM Danford, Christopher P, MD      . ondansetron (ZOFRAN) tablet 4 mg  4 mg Oral Q6H PRN Hugelmeyer, Alexis, DO       Or  . ondansetron (ZOFRAN) injection 4 mg  4 mg Intravenous Q6H PRN Hugelmeyer, Alexis, DO      . senna-docusate (Senokot-S) tablet 1 tablet  1 tablet Oral QHS PRN Hugelmeyer, Alexis, DO        Allergies  Allergen Reactions  . Oxytetracycline   . Sulfa Antibiotics Rash    REVIEW OF SYSTEMS (negative unless checked):   Cardiac:  [] Chest pain or chest pressure? [x] Shortness of breath upon activity? [] Shortness of breath when lying flat? [] Irregular heart rhythm?  Vascular:  [] Pain in calf, thigh, or hip brought on by walking? [] Pain in feet at night that wakes you up from your sleep? [] Blood clot in your veins? [] Leg swelling?  Pulmonary:  [] Oxygen at home? [] Productive cough? [] Wheezing?  Neurologic:  [x] Sudden weakness in arms or legs? [] Sudden numbness in arms or legs? [] Sudden onset of difficult speaking or slurred speech? [] Temporary loss of vision in one eye? []  Problems with dizziness?  Gastrointestinal:  [] Blood in stool? [] Vomited blood?  Genitourinary:  [] Burning when urinating? [] Blood in urine?  Psychiatric:  [] Major depression  Hematologic:  [] Bleeding   problems? [] Problems with blood clotting?  Dermatologic:  [x] Rashes or ulcers?  Constitutional:  [] Fever or chills?  Ear/Nose/Throat:  [] Change in hearing? [] Nose bleeds? [] Sore throat?  Musculoskeletal:  [] Back pain? [] Joint pain? [] Muscle pain?   For VQI Use Only   PRE-ADM LIVING Home  AMB STATUS Ambulatory  CAD Sx None  PRIOR CHF Moderate  STRESS TEST Normal    Physical Examination     Vitals:   12/06/17 2200 12/06/17 2230 12/07/17 0545 12/07/17 1330  BP: 109/67 107/71 110/69 96/72  Pulse: (!) 106 (!) 110 (!) 108 73  Resp:  17 17 18  Temp:  97.9 F (36.6 C) 97.8 F (36.6 C) 97.9 F (36.6 C)  TempSrc:  Oral Oral Oral  SpO2: 98% 100% 98% 100%  Weight:      Height:  5' 11" (1.803 m)     Body mass index is 30.27 kg/m.  General Alert, O x 3, WD, Ill appearing, somewhat unkempt  Head /AT,    Ear/Nose/ Throat Hearing grossly intact, nares without erythema or drainage, oropharynx without Erythema or Exudate, Mallampati score: 3,   Eyes PERRLA, EOMI,    Neck Supple, mid-line trachea,    Pulmonary Sym exp, good B air movt, CTA B  Cardiac RRR, Nl S1, S2, no Murmurs, No rubs, No S3,S4  Vascular Vessel Right Left  Radial Palpable Palpable  Brachial Palpable Palpable  Carotid Palpable, No Bruit Palpable, No Bruit  Aorta Not palpable N/A  Femoral Palpable Palpable  Popliteal Not palpable Not palpable  PT Not palpable Not palpable  DP Not palpable Not palpable    Gastro- intestinal soft, non-distended, non-tender to palpation, No guarding or rebound, no HSM, no masses, no CVAT B, No palpable prominent aortic pulse,    Musculo- skeletal M/S 5/5 throughout BUE, limited testing of legs due to weakness and wound, wounds in both legs as  documented below, edema 2-3+ B, extensive LDS and venous stasis dermatitis, dry gangrene in L 1st and 2nd toes  Neurologic Cranial nerves grossly intact, Pain and light touch intact in extremities except for decreased sensation in B feet, Motor exam as listed above  Psychiatric Judgement intact, Mood & affect appropriate for pt's clinical situation  Dermatologic See M/S exam for extremity exam, No rashes otherwise noted  Lymphatic  Palpable lymph nodes: None         Non-invasive Vascular Imaging     Outside ABI (10/19/17)  R:   ABI: 1.7,   PT: bi  DP: bi  L:   ABI: 0.74,   PT: mono  DP: mono   Radiology     Dg Foot Complete Left  Result Date: 12/06/2017 CLINICAL DATA:  Legs are full of fluid EXAM: LEFT FOOT - COMPLETE 3+ VIEW COMPARISON:  10/19/2017 FINDINGS: No fracture or malalignment. Mild degenerative change at the first MTP joint. No periostitis or bone destruction. No soft tissue gas IMPRESSION: No acute osseous abnormality Electronically Signed   By: Kim  Fujinaga M.D.   On: 12/06/2017 20:21   Dg Foot Complete Right  Result Date: 12/06/2017 CLINICAL DATA:  Legs are full of fluid EXAM: RIGHT FOOT COMPLETE - 3+ VIEW COMPARISON:  12/02/2017 FINDINGS: No fracture or malalignment. Small plantar calcaneal spur. Soft tissue edema. No soft tissue gas or foreign body IMPRESSION: No acute osseous abnormality Electronically Signed   By: Kim  Fujinaga M.D.   On: 12/06/2017 20:22    Laboratory   CBC CBC Latest   Ref Rng & Units 12/07/2017 12/06/2017  WBC 4.0 - 10.5 K/uL 14.9(H) 17.7(H)  Hemoglobin 13.0 - 17.0 g/dL 11.6(L) 13.2  Hematocrit 39.0 - 52.0 % 35.2(L) 39.1  Platelets 150 - 400 K/uL 209 225    BMP BMP Latest Ref Rng & Units 12/07/2017 12/06/2017  Glucose 65 - 99 mg/dL 96 102(H)  BUN 6 - 20 mg/dL 51(H) 60(H)  Creatinine 0.61 - 1.24 mg/dL 1.21 1.73(H)  Sodium 135 - 145 mmol/L 135 135  Potassium 3.5 - 5.1 mmol/L 4.2 4.3  Chloride 101 - 111 mmol/L 109 104  CO2 22  - 32 mmol/L 17(L) 18(L)  Calcium 8.9 - 10.3 mg/dL 8.7(L) 9.0    Coagulation Lab Results  Component Value Date   INR 1.11 12/07/2017   No results found for: PTT  Lipids No results found for: CHOL, TRIG, HDL, CHOLHDL, VLDL, LDLCALC, LDLDIRECT   Medical Decision Making   Peter Cochran is a 61 y.o. male who presents with: BLE CVI (C6) with venous stasis ulcers, likely CHF exacerbation of baseline CVI, critical limb ischemia in the form L foot gangrene, resolving acute renal failure, bilateral BLE neuropathy   L 1st and 2nd toes will need amputation especially with outside history of Pseudomonas infection.  The L lateral malleolus wound may be the more difficult to treat.  In this combination of arterial and venous ulcers, Dr. Duda's input may be helpful.  Right leg is probably a candidate for his silver impregnated compression stocking.  Outside ABI suggests a minimal of moderate disease in L leg.    I discussed with the patient the natural history of critical limb ischemia: 25% require amputation in one year, 50% are able to maintain their limbs in one year, and 25-30% die in one year due to comorbidities.  Given the limb threatening status of this patient, I recommend an aggressive work up including proceeding with an: Aortogram, Left leg runoff and possible intervention. . I discussed with the patient the nature of angiographic procedures, especially the limited patencies of any endovascular intervention. . The patient is aware of that the risks of an angiographic procedure include but are not limited to: bleeding, infection, access site complications, embolization, rupture of treated vessel, dissection, possible need for emergent surgical intervention, and possible need for surgical procedures to treat the patient's pathology. . This patient has significant neuropathic pain reportedly, so likely will need any angiography completed under General anesthesia in the [facility] OR.  Earliest  could get access to the [facility] OR is Thursday.  The patient is aware of the risks and agrees to proceed.    Given prior history of Pseudomonal infection, patient should strongly consider continuing IV abx while hospitalized.  He is high risk for limb loss.    I would, however, not be surprised if he checks himself out AMA  I discussed in depth with the patient the nature of atherosclerosis, and emphasized the importance of maximal medical management including strict control of blood pressure, blood glucose, and lipid levels, antiplatelet agents, obtaining regular exercise, and cessation of smoking.  The patient is aware that without maximal medical management the underlying atherosclerotic disease process will progress, limiting the benefit of any interventions.  The patient is currently on a statin: Lipitor.   The patient is currently on an anti-platelet: ASA.  Thank you for allowing us to participate in this patient's care.   Dominik Yordy, MD, FACS [facility] Office: [phone] Pager: [phone]  12/07/2017, 6:03 PM    

## 2017-12-07 NOTE — Progress Notes (Signed)
PROGRESS NOTE    Jerrin Recore  VZD:[id number] DOB: August 29, 1955 DOA: 12/06/2017 PCP: Floria Raveling, FNP      Brief Narrative:  Mr. Trueba is a 62 y.o. M with CHF EF 25%, PVD, chronic venous and arterial ulcers, hypothyroidism and hypertension who presented to OSH 1 weeks ago with syncope, found to have sepsis from left leg wound infection, treated and stabilized on vancomycin and aztreonam, developed AKI, was referred for vascular surgery evaluation and [facility], refused transport and drove himself to [facility].   Assessment & Plan:  Left lower extremity cellulitis Gangrene of the left first and second toes Has no allergy to penicillin, his brother has allergy.  Also, patient had culture from wound swab that grew Cipro resistant Pseudomonas recently (reportedly in CareEverywhere).  These are longstanding wounds, for which patient was being seen at [facility], wound clinic.  He has had 2 pseudomonas cultures from surface swabs.  There was discussion of PICC placement and IV antibiotics by [facility] ID when he saw them earlier this month, but this was delayed until after he saw vascular surgery, which was supposed to happen this past week (but he was admitted to the hospital). -Stop vancomycin -Start cefepime -Consult vascular surgery, appreciate cares -Consult nutrition -Consult WOC  Peripheral vascular disease ABIs reported 0.74 on the left.  Patient also endorses claudication symptoms and rest pain with elevation of the leg.  Acute kidney injury Baseline Cr 0.7.  Was more than doubled at 1.9 at OSH, 1.7 on admission yesterday, but has improved with IV fluids overnight.  -Check urine studies -Check renal US -Continue gentle IVF ovenright -Hold Entresto and diuretics for now -Repeat BMP tomorrow  Hypertension -Continue beta-blocker, Lipitor -Hold diuretics, Entresto  Chronic systolic CHF Appears somewhat volume up actually.  Not sure if the AKI is congestive or pre-renal.  Will give  fluids and hold diuretics and monitor AKI. -Hold Lasix, spironolactone, Entresto given AKI -Continue beta-blocker -Daily weights -Strict I.Os  Hypothyroidism -Continue levothyroxine  Depression -Continue Cymbalta       DVT prophylaxis: Lovenox Code Status: FULL Family Communication: Wife at bedside MDM and disposition Plan: The below labs and imaging reports were reviewed and summarized above, outside records were reviewed and summarized above.  The patient's status is clinically stable.  [place] records have been requested.  He was initially admitted with sepsis at the OSH.  His sepsis was stabilized and IV antibiotics were started empirically, and he improved.  He developed AKI, which is now resolving.  Workup of his AKI is outlined above.  He has a limb-threatnening infection, and Vascular surgery will be consutled regarding the possibility of revascularization.  Likely he cannot get angiography until Wednesday, if his renal function resolves and antibiotic choices can be determined before discharge, we may be able to discharge with PICC and outpatient Vascular surgery follow up.   Consultants:   Vascular surgery  Procedures:   ABI  Antimicrobials:   Aztreonam 5/24 >> 5/25  Vancomycin x1 on 5/24  Cefepime 5/25   Subjective: Patient has pain in the left leg, burning and severe, if he raises the leg or walks.  He has no fever, confusion.  He made a lot of urine yesterday, today, he has made less urine.  No confusion, weakness, fever.  No vomiting, abdominal pain.  No flank pain, suprapubic pain.  Objective: Vitals:   12/06/17 2200 12/06/17 2230 12/07/17 0545 12/07/17 1330  BP: 109/67 107/71 110/69 96/72  Pulse: (!) 106 (!) 110 (!) 108  73  Resp:  Temp:  97.9 F (36.6 C) 97.8 F (36.6 C) 97.9 F (36.6 C)  TempSrc:  Oral Oral Oral  SpO2: 98% 100% 98% 100%  Weight:      Height:   (1.803 m)      Intake/Output Summary (Last 24 hours) at 12/07/2017  1424 Last data filed at 12/07/2017 1331 Gross per 24 hour  Intake 1004.5 ml  Output 150 ml  Net 854.5 ml   Filed Weights   12/06/17 1922  Weight: 98.4 kg (217 lb)    Examination: General appearance: Older adult male, alert and in no acute distress.  Sitting in recliner, looking at cell phone.  Interactive. HEENT: Anicteric, conjunctiva pink, lids and lashes normal. No nasal deformity, discharge, epistaxis.  Lips moist, teeth with very poor dentition.  No oral lesions, OP moist, hearing normal.   Skin: Warm and dry.  No jaundice.  Chronic venous insuffieciency changes and brawny edema to both lower extremities.  There is also dry gangrene of the left 1st and 2nd digits.  There are also several lateral calf ulcers, punched out, shallow on either leg.  These have surrounding pain and edema. Cardiac: RRR, nl S1-S2, no murmurs appreciated.  Capillary refill is brisk.  JVP not visible.  2+ LE edema.  Radial pulses 2+ and symmetric. Respiratory: Normal respiratory rate and rhythm.  CTAB without rales or wheezes. Abdomen: Abdomen soft.  No TTP. No ascites, distension, hepatosplenomegaly.  Large groin hernia. MSK: No deformities or effusions of the large joints of the arms or legs bilaterally. Neuro: Awake and alert.  EOMI, moves all extremities. Speech fluent.    Psych: Sensorium intact and responding to questions, attention normal. Affect normal.  Judgment and insight appear normal.    Data Reviewed: I have personally reviewed following labs and imaging studies:  CBC: Recent Labs  Lab 12/06/17 1227 12/07/17 0550  WBC 17.7* 14.9*  NEUTROABS 15.7*  --   HGB 13.2 11.6*  HCT 39.1 35.2*  MCV 92.0 94.6  PLT 225 209   Basic Metabolic Panel: Recent Labs  Lab 12/06/17 1227 12/07/17 0550  NA 135 135  K 4.3 4.2  CL 104 109  CO2 18* 17*  GLUCOSE 102* 96  BUN 60* 51*  CREATININE 1.73* 1.21  CALCIUM 9.0 8.7*   GFR: Estimated Creatinine Clearance: 77.6 mL/min (by C-G formula based on  SCr of 1.21 mg/dL). Liver Function Tests: Recent Labs  Lab 12/06/17 1227 12/07/17 0550  AST 25 22  ALT 21 18  ALKPHOS 70 63  BILITOT 0.6 0.6  PROT 7.8 7.1  ALBUMIN 2.8* 2.4*   No results for input(s): LIPASE, AMYLASE in the last 168 hours. No results for input(s): AMMONIA in the last 168 hours. Coagulation Profile: Recent Labs  Lab 12/07/17 0550  INR 1.11   Cardiac Enzymes: No results for input(s): CKTOTAL, CKMB, CKMBINDEX, TROPONINI in the last 168 hours. BNP (last 3 results) No results for input(s): PROBNP in the last 8760 hours. HbA1C: Recent Labs    12/07/17 0808  HGBA1C 6.0*   CBG: No results for input(s): GLUCAP in the last 168 hours. Lipid Profile: No results for input(s): CHOL, HDL, LDLCALC, TRIG, CHOLHDL, LDLDIRECT in the last 72 hours. Thyroid Function Tests: No results for input(s): TSH, T4TOTAL, FREET4, T3FREE, THYROIDAB in the last 72 hours. Anemia Panel: No results for input(s): VITAMINB12, FOLATE, FERRITIN, TIBC, IRON, RETICCTPCT in the last 72 hours. Urine analysis:    Component Value Date/Time  COLORURINE YELLOW 12/06/2017 2100   APPEARANCEUR HAZY (A) 12/06/2017 2100   LABSPEC 1.017 12/06/2017 2100   PHURINE 5.0 12/06/2017 2100   GLUCOSEU NEGATIVE 12/06/2017 2100   HGBUR MODERATE (A) 12/06/2017 2100   BILIRUBINUR NEGATIVE 12/06/2017 2100   KETONESUR NEGATIVE 12/06/2017 2100   PROTEINUR NEGATIVE 12/06/2017 2100   NITRITE NEGATIVE 12/06/2017 2100   LEUKOCYTESUR NEGATIVE 12/06/2017 2100   Sepsis Labs: (procalcitonin:4,lacticacidven:4)  )No results found for this or any previous visit (from the past 240 hour(s)).       Radiology Studies: Dg Foot Complete Left  Result Date: 12/06/2017 CLINICAL DATA:  Legs are full of fluid EXAM: LEFT FOOT - COMPLETE 3+ VIEW COMPARISON:  10/19/2017 FINDINGS: No fracture or malalignment. Mild degenerative change at the first MTP joint. No periostitis or bone destruction. No soft tissue gas  IMPRESSION: No acute osseous abnormality Electronically Signed   By: Jasmine Pang M.D.   On: 12/06/2017 20:21   Dg Foot Complete Right  Result Date: 12/06/2017 CLINICAL DATA:  Legs are full of fluid EXAM: RIGHT FOOT COMPLETE - 3+ VIEW COMPARISON:  12/02/2017 FINDINGS: No fracture or malalignment. Small plantar calcaneal spur. Soft tissue edema. No soft tissue gas or foreign body IMPRESSION: No acute osseous abnormality Electronically Signed   By: Jasmine Pang M.D.   On: 12/06/2017 20:22        Scheduled Meds: . aspirin EC  81 mg Oral Daily  . atorvastatin  40 mg Oral Daily  . carvedilol  3.125 mg Oral BID WC  . DULoxetine  30 mg Oral Daily  . enoxaparin (LOVENOX) injection  40 mg Subcutaneous Daily  . feeding supplement (ENSURE ENLIVE)  237 mL Oral BID BM  . levothyroxine  100 mcg Oral QAC breakfast   Continuous Infusions: . sodium chloride 75 mL/hr at 12/07/17 0300     LOS: 1 day    Time spent: 35 minutes    Alberteen Sam, MD [facility] 12/07/2017, 12:24 PM     Pager [phone] --- please page though AMION:  [email or web address] If 7PM-7AM, please contact night-coverage

## 2017-12-07 NOTE — Progress Notes (Signed)
ANTIBIOTIC CONSULT NOTE - INITIAL  Pharmacy Consult for Cefepime Indication: wound infection, cover PSA  Allergies  Allergen Reactions  . Oxytetracycline   . Sulfa Antibiotics Rash    Patient Measurements: Height:  (180.3 cm) Weight: 217 lb (98.4 kg)(Per patient - weighed 5/23) IBW/kg (Calculated) : 75.3 Adjusted Body Weight:    Vital Signs: Temp: 97.9 F (36.6 C) (05/25 1330) Temp Source: Oral (05/25 1330) BP: 96/72 (05/25 1330) Pulse Rate: 73 (05/25 1330) Intake/Output from previous day: 05/24 0701 - 05/25 0700 In: 662.5 [I.V.:562.5; IV Piggyback:100] Out: -  Intake/Output from this shift: Total I/O In: 342 [P.O.:342] Out: 150 [Urine:150]  Labs: Recent Labs    12/06/17 1227 12/07/17 0550  WBC 17.7* 14.9*  HGB 13.2 11.6*  PLT 225 209  CREATININE 1.73* 1.21   Estimated Creatinine Clearance: 77.6 mL/min (by C-G formula based on SCr of 1.21 mg/dL). No results for input(s): VANCOTROUGH, VANCOPEAK, VANCORANDOM, GENTTROUGH, GENTPEAK, GENTRANDOM, TOBRATROUGH, TOBRAPEAK, TOBRARND, AMIKACINPEAK, AMIKACINTROU, AMIKACIN in the last 72 hours.   Microbiology: No results found for this or any previous visit (from the past 720 hour(s)).  Medical History: Past Medical History:  Diagnosis Date  . Cellulitis    mild.  L leg  . CHF (congestive heart failure) (HCC)    Dr. Norman Herrlich  . 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  . Hand fracture, right    Dr. Linda Hedges  . Hypothyroidism (acquired)   . Leg ulcer, left (HCC)   . PAD (peripheral artery disease) (HCC)   . Periorbital hematoma, right   . Peripheral neuropathy   . Toe ulcer (HCC)    Left  . Wound of left leg    Non-healing. Dr. Liston Alba, Dr. Bynum Bellows.   Assessment:  ID: Sepsis with Severe wound infection, B LE cellulits, gangrene of L foot toes, (Vanco/Clinda/Aztreonam at OSH), Afebrile. Vascular surgery consult - patient had culture from wound swab that grew Cipro resistant  Pseudomonas recently (reportedly in CareEverywhere).  These are longstanding wounds, for which patient was being seen at [facility], wound clinic.  He has had 2 pseudomonas cultures from surface swabs.  Vanco 5/24>5/25 Aztreonam 5/24>>5/25 Cefepime 5/25>>  Goal of Therapy:  Eradication of infection  Plan:  Cefepime 2g IV q 8hrs  Zakye Baby S. Merilynn Finland, PharmD, BCPS Clinical Staff Pharmacist Pager [phone]  Misty Stanley Stillinger 12/07/2017,2:49 PM

## 2017-12-07 NOTE — Clinical Social Work Note (Signed)
CSW acknowledges consult "Access meds for discharge." Please consult RNCM for this need.  CSW signing off. Consult again if any social work needs arise.  Charlynn Court, CSW [phone]

## 2017-12-07 NOTE — H&P (View-Only) (Signed)
Requested by:  Dr. Maryfrances Bunnell ([facility])  Reason for consultation: left foot gangrene   History of Present Illness   Peter Cochran is a 62 y.o. (04-01-1956) male who presents with chief complaint: "left toes black".  This patient has longstanding history medical non-compliance.  He discloses >2 years history of bilateral leg swelling with intermittent development of "blisters" which sound like venous stasis ulceration.  He reported has healed multiple episodes of such but over the last month, he develop blackening of his L 1st and 2nd toes.  Reported he get neuropathic burning in his heels which result in twitching.  He reported kicked a counter during one episode resulting in injury to the L 1st and 2nd toes.  This toes proceed to gangrenous transformation.  Pt currently denies any pain in his L foot.  He wants to go home.   Atherosclerotic risk factors include: active smoker.  Past Medical History:  Diagnosis Date  . Cellulitis    mild.  L leg  . CHF (congestive heart failure) (HCC)    Dr. Norman Herrlich  . 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  . Hand fracture, right    Dr. Linda Hedges  . Hypothyroidism (acquired)   . Leg ulcer, left (HCC)   . PAD (peripheral artery disease) (HCC)   . Periorbital hematoma, right   . Peripheral neuropathy   . Toe ulcer (HCC)    Left  . Wound of left leg    Non-healing. Dr. Liston Alba, Dr. Bynum Bellows.    History reviewed. No pertinent surgical history.  Social History   Socioeconomic History  . Marital status: Married    Spouse name: Not on file  . Number of children: Not on file  . Years of education: Not on file  . Highest education level: Not on file  Occupational History  . Not on file  Social Needs  . Financial resource strain: Not on file  . Food insecurity:    Worry: Not on file    Inability: Not on file  . Transportation needs:    Medical: Not on file    Non-medical: Not on file  Tobacco Use  .  Smoking status: Current Every Day Smoker    Packs/day: 2.00    Types: Cigarettes  . Smokeless tobacco: Never Used  Substance and Sexual Activity  . Alcohol use: Not Currently  . Drug use: Not Currently  . Sexual activity: Not on file  Lifestyle  . Physical activity:    Days per week: Not on file    Minutes per session: Not on file  . Stress: Not on file  Relationships  . Social connections:    Talks on phone: Not on file    Gets together: Not on file    Attends religious service: Not on file    Active member of club or organization: Not on file    Attends meetings of clubs or organizations: Not on file    Relationship status: Not on file  . Intimate partner violence:    Fear of current or ex partner: Not on file    Emotionally abused: Not on file    Physically abused: Not on file    Forced sexual activity: Not on file  Other Topics Concern  . Not on file  Social History Narrative  . Not on file    Family History  Problem Relation Age of Onset  . Hypertension Mother   . Hyperlipidemia Mother   .  Heart attack Father   . Diabetes Father   . Hypertension Father   . Hyperlipidemia Father   . Stroke Maternal Grandmother     Current Facility-Administered Medications  Medication Dose Route Frequency Provider Last Rate Last Dose  . 0.9 %  sodium chloride infusion   Intravenous Continuous Alberteen Sam, MD 75 mL/hr at 12/07/17 1535    . aspirin EC tablet 81 mg  81 mg Oral Daily Hugelmeyer, Alexis, DO   81 mg at 12/07/17 0954  . atorvastatin (LIPITOR) tablet 40 mg  40 mg Oral Daily Hugelmeyer, Alexis, DO   40 mg at 12/07/17 0954  . bisacodyl (DULCOLAX) EC tablet 5 mg  5 mg Oral Daily PRN Hugelmeyer, Alexis, DO      . carvedilol (COREG) tablet 3.125 mg  3.125 mg Oral BID WC Hugelmeyer, Alexis, DO   3.125 mg at 12/07/17 0803  . ceFEPIme (MAXIPIME) 2 g in sodium chloride 0.9 % 100 mL IVPB  2 g Intravenous Q8H Estefan, [place], [place]   Stopped at 12/07/17 1608  .  DULoxetine (CYMBALTA) DR capsule 30 mg  30 mg Oral Daily Hugelmeyer, Alexis, DO   30 mg at 12/07/17 0954  . enoxaparin (LOVENOX) injection 40 mg  40 mg Subcutaneous Daily Hugelmeyer, Alexis, DO   40 mg at 12/07/17 0959  . feeding supplement (ENSURE ENLIVE) (ENSURE ENLIVE) liquid 237 mL  237 mL Oral BID BM Hugelmeyer, Alexis, DO      . HYDROcodone-acetaminophen (NORCO/VICODIN) 5-325 MG per tablet 1-2 tablet  1-2 tablet Oral Q4H PRN Hugelmeyer, Alexis, DO      . levothyroxine (SYNTHROID, LEVOTHROID) tablet 100 mcg  100 mcg Oral QAC breakfast Hugelmeyer, Alexis, DO   100 mcg at 12/07/17 0803  . magnesium citrate solution 1 Bottle  1 Bottle Oral Once PRN Hugelmeyer, Alexis, DO      . [START ON 12/08/2017] multivitamin with minerals tablet 1 tablet  1 tablet Oral Daily Danford, Earl Lites, MD      . nutrition supplement (JUVEN) (JUVEN) powder packet 1 packet  1 packet Oral BID BM Danford, Earl Lites, MD      . ondansetron (ZOFRAN) tablet 4 mg  4 mg Oral Q6H PRN Hugelmeyer, Alexis, DO       Or  . ondansetron (ZOFRAN) injection 4 mg  4 mg Intravenous Q6H PRN Hugelmeyer, Alexis, DO      . senna-docusate (Senokot-S) tablet 1 tablet  1 tablet Oral QHS PRN Hugelmeyer, Alexis, DO        Allergies  Allergen Reactions  . Oxytetracycline   . Sulfa Antibiotics Rash    REVIEW OF SYSTEMS (negative unless checked):   Cardiac:   Chest pain or chest pressure?  Shortness of breath upon activity?  Shortness of breath when lying flat?  Irregular heart rhythm?  Vascular:   Pain in calf, thigh, or hip brought on by walking?  Pain in feet at night that wakes you up from your sleep?  Blood clot in your veins?  Leg swelling?  Pulmonary:   Oxygen at home?  Productive cough?  Wheezing?  Neurologic:   Sudden weakness in arms or legs?  Sudden numbness in arms or legs?  Sudden onset of difficult speaking or slurred speech?  Temporary loss of vision in one eye?   Problems with dizziness?  Gastrointestinal:   Blood in stool?  Vomited blood?  Genitourinary:   Burning when urinating?  Blood in urine?  Psychiatric:   Major depression  Hematologic:   Bleeding  problems?  Problems with blood clotting?  Dermatologic:   Rashes or ulcers?  Constitutional:   Fever or chills?  Ear/Nose/Throat:   Change in hearing?  Nose bleeds?  Sore throat?  Musculoskeletal:   Back pain?  Joint pain?  Muscle pain?   For VQI Use Only   PRE-ADM LIVING Home  AMB STATUS Ambulatory  CAD Sx None  PRIOR CHF Moderate  STRESS TEST Normal    Physical Examination     Vitals:   12/06/17 2200 12/06/17 2230 12/07/17 0545 12/07/17 1330  BP: 109/67 107/71 110/69 96/72  Pulse: (!) 106 (!) 110 (!) 108 73  Resp:  Temp:  97.9 F (36.6 C) 97.8 F (36.6 C) 97.9 F (36.6 C)  TempSrc:  Oral Oral Oral  SpO2: 98% 100% 98% 100%  Weight:      Height:   (1.803 m)     Body mass index is 30.27 kg/m.  General Alert, O x 3, WD, Ill appearing, somewhat unkempt  Head [place]/AT,    Ear/Nose/ Throat Hearing grossly intact, nares without erythema or drainage, oropharynx without Erythema or Exudate, Mallampati score: 3,   Eyes PERRLA, EOMI,    Neck Supple, mid-line trachea,    Pulmonary Sym exp, good B air movt, CTA B  Cardiac RRR, Nl S1, S2, no Murmurs, No rubs, No S3,S4  Vascular Vessel Right Left  Radial Palpable Palpable  Brachial Palpable Palpable  Carotid Palpable, No Bruit Palpable, No Bruit  Aorta Not palpable N/A  Femoral Palpable Palpable  Popliteal Not palpable Not palpable  PT Not palpable Not palpable  DP Not palpable Not palpable    Gastro- intestinal soft, non-distended, non-tender to palpation, No guarding or rebound, no HSM, no masses, no CVAT B, No palpable prominent aortic pulse,    Musculo- skeletal M/S 5/5 throughout BUE, limited testing of legs due to weakness and wound, wounds in both legs as  documented below, edema 2-3+ B, extensive LDS and venous stasis dermatitis, dry gangrene in L 1st and 2nd toes  Neurologic Cranial nerves grossly intact, Pain and light touch intact in extremities except for decreased sensation in B feet, Motor exam as listed above  Psychiatric Judgement intact, Mood & affect appropriate for pt's clinical situation  Dermatologic See M/S exam for extremity exam, No rashes otherwise noted  Lymphatic  Palpable lymph nodes: None         Non-invasive Vascular Imaging     Outside ABI (10/19/17)  R:   ABI: 1.7,   PT: bi  DP: bi  L:   ABI: 0.74,   PT: mono  DP: mono   Radiology     Dg Foot Complete Left  Result Date: 12/06/2017 CLINICAL DATA:  Legs are full of fluid EXAM: LEFT FOOT - COMPLETE 3+ VIEW COMPARISON:  10/19/2017 FINDINGS: No fracture or malalignment. Mild degenerative change at the first MTP joint. No periostitis or bone destruction. No soft tissue gas IMPRESSION: No acute osseous abnormality Electronically Signed   By: Jasmine Pang M.D.   On: 12/06/2017 20:21   Dg Foot Complete Right  Result Date: 12/06/2017 CLINICAL DATA:  Legs are full of fluid EXAM: RIGHT FOOT COMPLETE - 3+ VIEW COMPARISON:  12/02/2017 FINDINGS: No fracture or malalignment. Small plantar calcaneal spur. Soft tissue edema. No soft tissue gas or foreign body IMPRESSION: No acute osseous abnormality Electronically Signed   By: Jasmine Pang M.D.   On: 12/06/2017 20:22    Laboratory   CBC CBC Latest  Ref Rng & Units 12/07/2017 12/06/2017  WBC 4.0 - 10.5 K/uL 14.9(H) 17.7(H)  Hemoglobin 13.0 - 17.0 g/dL 11.6(L) 13.2  Hematocrit 39.0 - 52.0 % 35.2(L) 39.1  Platelets 150 - 400 K/uL 209 225    BMP BMP Latest Ref Rng & Units 12/07/2017 12/06/2017  Glucose 65 - 99 mg/dL 96 161(W)  BUN 6 - 20 mg/dL 96(E) 45(W)  Creatinine 0.61 - 1.24 mg/dL 0.98 1.19(J)  Sodium 478 - 145 mmol/L 135 135  Potassium 3.5 - 5.1 mmol/L 4.2 4.3  Chloride 101 - 111 mmol/L 109 104  CO2 22  - 32 mmol/L 17(L) 18(L)  Calcium 8.9 - 10.3 mg/dL 2.9(F) 9.0    Coagulation Lab Results  Component Value Date   INR 1.11 12/07/2017   No results found for: PTT  Lipids No results found for: CHOL, TRIG, HDL, CHOLHDL, VLDL, LDLCALC, LDLDIRECT   Medical Decision Making   Peter Cochran is a 62 y.o. male who presents with: BLE CVI (C6) with venous stasis ulcers, likely CHF exacerbation of baseline CVI, critical limb ischemia in the form L foot gangrene, resolving acute renal failure, bilateral BLE neuropathy   L 1st and 2nd toes will need amputation especially with outside history of Pseudomonas infection.  The L lateral malleolus wound may be the more difficult to treat.  In this combination of arterial and venous ulcers, Dr. Audrie Lia input may be helpful.  Right leg is probably a candidate for his silver impregnated compression stocking.  Outside ABI suggests a minimal of moderate disease in L leg.    I discussed with the patient the natural history of critical limb ischemia: 25% require amputation in one year, 50% are able to maintain their limbs in one year, and 25-30% die in one year due to comorbidities.  Given the limb threatening status of this patient, I recommend an aggressive work up including proceeding with an: Aortogram, Left leg runoff and possible intervention. . I discussed with the patient the nature of angiographic procedures, especially the limited patencies of any endovascular intervention. . The patient is aware of that the risks of an angiographic procedure include but are not limited to: bleeding, infection, access site complications, embolization, rupture of treated vessel, dissection, possible need for emergent surgical intervention, and possible need for surgical procedures to treat the patient's pathology. . This patient has significant neuropathic pain reportedly, so likely will need any angiography completed under General anesthesia in the [facility] OR.  Earliest  could get access to the [facility] OR is Thursday.  The patient is aware of the risks and agrees to proceed.    Given prior history of Pseudomonal infection, patient should strongly consider continuing IV abx while hospitalized.  He is high risk for limb loss.    I would, however, not be surprised if he checks himself out AMA  I discussed in depth with the patient the nature of atherosclerosis, and emphasized the importance of maximal medical management including strict control of blood pressure, blood glucose, and lipid levels, antiplatelet agents, obtaining regular exercise, and cessation of smoking.  The patient is aware that without maximal medical management the underlying atherosclerotic disease process will progress, limiting the benefit of any interventions.  The patient is currently on a statin: Lipitor.   The patient is currently on an anti-platelet: ASA.  Thank you for allowing [place] to participate in this patient's care.   Leonides Sake, MD, FACS [facility] Office: [phone] Pager: [phone]  12/07/2017, 6:03 PM

## 2017-12-07 NOTE — Progress Notes (Signed)
Initial Nutrition Assessment  DOCUMENTATION CODES:   (Will assess for malnutrition at follow-up)  INTERVENTION:  - Continue Ensure Enlive BID, each supplement provides 350 kcal and 20 grams of protein. - Will order Juven BID, each packet provides 80 kcal and 14 grams of amino acids; supplement contains CaHMB, glutamine, and arginine, to promote wound healing. - Will order Magic Cup with dinner meals, this supplement provides 290 kcal and 9 grams of protein. - Will order daily multivitamin with minerals.  - Continue to encourage PO intakes.  NUTRITION DIAGNOSIS:   Increased nutrient needs related to wound healing as evidenced by estimated needs.  GOAL:   Patient will meet greater than or equal to 90% of their needs  MONITOR:   PO intake, Supplement acceptance, Weight trends, Labs, Skin  REASON FOR ASSESSMENT:   Consult Wound healing  ASSESSMENT:   62 y.o. M with CHF EF 25%, PVD, chronic venous and arterial ulcers, hypothyroidism and hypertension who presented to OSH 1 weeks ago with syncope, found to have sepsis from left leg wound infection, treated and stabilized on vancomycin and aztreonam, developed AKI, was referred for vascular surgery evaluation and [facility], refused transport and drove himself to [facility].  BMI indicates obesity. Unable to see patient x3 attempts today. Per chart review, pt consumed 65% of breakfast (300 kcal and 13 grams of protein). Ensure Enlive was ordered BID per ONS protocol this AM and pt refused supplement this AM. Will continue order at this time and adjust if needed at follow-up. Per chart review, pt has lost 18 lbs (7.6% body weight) in the past 1.5 months. This is significant for time frame, but of note weights on both 4/15 and 5/24 appear to be stated weights.  Per Dr. Sheryn Bison note this afternoon: LLE cellulitis and gangrene of the L first and second toes, PVD, AKI and holding diuretics, plan for daily weights and strict I/Os. Vascular Surgery has  been consulted and is following patient.    Medications reviewed; 100 mcg oral Synthroid/day. Labs reviewed; BUN: 51 mg/dL, Ca: 8.7 mg/dL.  IVF: NS @ 75 mL/hr.       NUTRITION - FOCUSED PHYSICAL EXAM:  Unable to complete; will attempt at follow-up.   Diet Order:   Diet Order           Diet Heart Room service appropriate? Yes; Fluid consistency: Thin  Diet effective now          EDUCATION NEEDS:   No education needs have been identified at this time  Skin:  Skin Assessment: Skin Integrity Issues: Skin Integrity Issues:: Stage IV, Other (Comment) Stage IV: coccyx Other: bilateral legs venous stasis ulcers  Last BM:  5/25  Height:   Ht Readings from Last 1 Encounters:  12/06/17  (1.803 m)    Weight:   Wt Readings from Last 1 Encounters:  12/06/17 217 lb (98.4 kg)    Ideal Body Weight:  78.18 kg  BMI:  Body mass index is 30.27 kg/m.  Estimated Nutritional Needs:   Kcal:  1610-9604   Protein:  125-135 grams  Fluid:  >/= 2.3 L/day     Trenton Gammon, MS, RD, LDN, [facility] Inpatient Clinical Dietitian Pager # [phone] After hours/weekend pager # [phone]

## 2017-12-07 NOTE — Progress Notes (Signed)
[place] called to take patient for ABI- patient states he can not lie down for test. He states he needs to be numbed or knocked out. He can not lie flat for even 15 minutes.   MD notified.

## 2017-12-07 NOTE — Evaluation (Signed)
Physical Therapy Evaluation Patient Details Name: Peter Cochran MRN: [id number] DOB: 1956/02/21 Today's Date: 12/07/2017   History of Present Illness  Pt. is a 62 y.o. M with significant PMH of CHF and PAD who presents for evaluation of wound infection.    Clinical Impression  Patient presenting with extremely limited functional mobility secondary to bilateral lower extremity pain, impaired sensation, generalized weakness, decreased skin integrity, diminished balance, and activity tolerance. Currently, patient requiring two person maximal assistance to stand from chair with use of Corene Cornea. Session focused on standing with Stedy x 5 minutes for pressure relief and instruction on pressure relief schedule while seated in chair. Unfortunately, patient is refusing to be placed in the bed due to intolerance for legs being elevated (although explained that feet can be placed in dependent position in bed) and remains in the chair 24/7. Patient is refusing SNF unless he has an amputation, which at that point, he will reconsider. Based on evaluation and patient's adamant views, patient has very poor prognosis. Is at high risk for falls based on current deficits listed below and history of falling. Will continue to follow acutely for transfers for pressure relief.     Follow Up Recommendations SNF    Equipment Recommendations  None recommended by PT    Recommendations for Other Services       Precautions / Restrictions Precautions Precautions: Fall Restrictions Weight Bearing Restrictions: No      Mobility  Bed Mobility               General bed mobility comments: Refusing to transfer to bed.   Transfers Overall transfer level: Needs assistance Equipment used: Ambulation equipment used Transfers: Sit to/from Stand Sit to Stand: Max assist;+2 physical assistance         General transfer comment: Max assist + 2 to boost up to stand from [employer]. Towel placed underneath feet. Patient  with use of LUE (refuses to use RUE although has the strength to do so). Trunk flexed position once standing. Stood x 5 minutes.  Ambulation/Gait                Stairs            Wheelchair Mobility    Modified Rankin (Stroke Patients Only)       Balance Overall balance assessment: Needs assistance Sitting-balance support: Bilateral upper extremity supported;Feet supported Sitting balance-Leahy Scale: Fair       Standing balance-Leahy Scale: Zero                               Pertinent Vitals/Pain Pain Assessment: Faces Faces Pain Scale: Hurts even more Pain Location: BLE Pain Descriptors / Indicators: Burning Pain Intervention(s): Monitored during session    Home Living Family/patient expects to be discharged to:: Private residence Living Arrangements: Spouse/significant other Available Help at Discharge: Family Type of Home: House Home Access: Ramped entrance     Home Layout: One level Home Equipment: Environmental consultant - 2 wheels;Wheelchair - manual Additional Comments: Hires nephew to assist when wife is at work during the day. Has HHPT/HHOT.    Prior Function Level of Independence: Needs assistance   Gait / Transfers Assistance Needed: Patient transfers by reaching and pulling up on sink to stand. Sits in the recliner for the majority of the day but reports to transfer to the wheelchair his wife "holds his right arm and he takes a couple baby steps over to it." Reports  limited ambulation using RW with HHPT but unsure the last date that patient was ambulatory.  ADL's / Homemaking Assistance Needed: Patient requires maximal assistance for ADL's. Uses diaper and wife changes him throughout the day. Unable to use urinal due to hernia. Does state he occasionally transfers to [facility] for bowel movements but unsure of last date patient was able to attempt this.    Comments: has fallen x 2 in past month. Once out of bed and once when trying to stand at sink and  feet slipped from underneath him.     Hand Dominance        Extremity/Trunk Assessment   Upper Extremity Assessment Upper Extremity Assessment: RUE deficits/detail;LUE deficits/detail RUE Deficits / Details: R handgrip weaker than left. Patient states a month ago, he fell out of bed and "broke his hand." He states HHOT left a splint on his hand and caused a wound. He refuses to bear weight through his hand until the "bone doctor" can check him out. However, patient has adequate grip strength to use it functionally. MMT: elbow flexion/extension 4/5, shoulder flexion 3+/5 LUE Deficits / Details: MMT: shoulder flexion 3+/5, elbow extension/flexion 4/5    Lower Extremity Assessment Lower Extremity Assessment: RLE deficits/detail;LLE deficits/detail RLE Deficits / Details: MMT: hip flexion 4/5, knee extension 3/5, limited ankle dorsiflexion/plantarflexion due to edema. Distal limb displays erythema, edema, cool to touch, scaling, and discoloration with weeping wounds.  LLE Deficits / Details: MMT: hip flexion 4/5, knee extension/flexion 3/5, ankle dorsiflexion and plantarflexion limited due to edema. Erythema, edema, scaling, discoloration, and cool to touch from mid calf distal; weeping wounds. left 1st, 2nd, 3rd toes are gangrenous with ulceration of left lateral ankle.    Cervical / Trunk Assessment Cervical / Trunk Assessment: Kyphotic  Communication   Communication: No difficulties  Cognition Arousal/Alertness: Awake/alert Behavior During Therapy: WFL for tasks assessed/performed Overall Cognitive Status: No family/caregiver present to determine baseline cognitive functioning                                 General Comments: Patient seemingly able to answer questions appropriately and oriented x 4. Follows multi step commands consistently. however, stated he had a fall "underneath the sink," last night in the hospital, which did not occur.      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   Assessment/Plan    PT Assessment Patient needs continued PT services  PT Problem List Decreased strength;Decreased range of motion;Decreased activity tolerance;Decreased balance;Decreased mobility;Decreased safety awareness;Impaired sensation;Pain;Decreased skin integrity       PT Treatment Interventions Functional mobility training;DME instruction;Therapeutic activities;Therapeutic exercise;Balance training;Patient/family education;Wheelchair mobility training    PT Goals (Current goals can be found in the Care Plan section)  Acute Rehab PT Goals Patient Stated Goal: go home PT Goal Formulation: With patient Time For Goal Achievement: 12/21/17 Potential to Achieve Goals: Poor Additional Goals Additional Goal #1: Patient will correctly verbalize and perform seated pressure relief schedule.    Frequency Min 2X/week   Barriers to discharge        Co-evaluation               AM-PAC PT "6 Clicks" Daily Activity  Outcome Measure Difficulty turning over in bed (including adjusting bedclothes, sheets and blankets)?:  A Lot Difficulty moving from lying on back to sitting on the side of the bed? : Unable Difficulty sitting down on and standing up from a chair with arms (e.g., wheelchair, bedside commode, etc,.)?: Unable Help needed moving to and from a bed to chair (including a wheelchair)?: Total Help needed walking in hospital room?: Total Help needed climbing 3-5 steps with a railing? : Total 6 Click Score: 7    End of Session Equipment Utilized During Treatment: Gait belt Activity Tolerance: Patient limited by pain Patient left: in chair;Other (comment)((refused to be within reach of call bell)) Nurse Communication: Mobility status PT Visit  Diagnosis: Other abnormalities of gait and mobility (R26.89);Muscle weakness (generalized) (M62.81);History of falling (Z91.81);Adult, failure to thrive (R62.7);Pain Pain - part of body: Leg    Time: 1335-1405 PT Time Calculation (min) (ACUTE ONLY): 30 min   Charges:   PT Evaluation $PT Eval Moderate Complexity: 1 Mod PT Treatments $Therapeutic Activity: 8-22 mins   PT G Codes:        Laurina Bustle, PT, DPT Acute Rehabilitation Services  Pager: [phone]   Vanetta Mulders 12/07/2017, 2:39 PM

## 2017-12-08 ENCOUNTER — Inpatient Hospital Stay (HOSPITAL_COMMUNITY): Payer: PRIVATE HEALTH INSURANCE

## 2017-12-08 DIAGNOSIS — K220 Achalasia of cardia: Secondary | ICD-10-CM

## 2017-12-08 LAB — CBC
HCT: 36.3 % — ABNORMAL LOW (ref 39.0–52.0)
Hemoglobin: 12.1 g/dL — ABNORMAL LOW (ref 13.0–17.0)
MCH: 31.4 pg (ref 26.0–34.0)
MCHC: 33.3 g/dL (ref 30.0–36.0)
MCV: 94.3 fL (ref 78.0–100.0)
PLATELETS: 200 10*3/uL (ref 150–400)
RBC: 3.85 MIL/uL — AB (ref 4.22–5.81)
RDW: 15.3 % (ref 11.5–15.5)
WBC: 12.2 10*3/uL — ABNORMAL HIGH (ref 4.0–10.5)

## 2017-12-08 LAB — BASIC METABOLIC PANEL
Anion gap: 9 (ref 5–15)
BUN: 41 mg/dL — ABNORMAL HIGH (ref 6–20)
CHLORIDE: 110 mmol/L (ref 101–111)
CO2: 18 mmol/L — AB (ref 22–32)
CREATININE: 1.2 mg/dL (ref 0.61–1.24)
Calcium: 8.5 mg/dL — ABNORMAL LOW (ref 8.9–10.3)
GFR calc non Af Amer: >60 mL/min (ref >60)
GLUCOSE: 102 mg/dL — AB (ref 65–99)
Potassium: 4.6 mmol/L (ref 3.5–5.1)
Sodium: 137 mmol/L (ref 135–145)

## 2017-12-08 LAB — BRAIN NATRIURETIC PEPTIDE: B Natriuretic Peptide: 321.2 pg/mL — ABNORMAL HIGH (ref 0.0–100.0)

## 2017-12-08 LAB — UREA NITROGEN, URINE: Urea Nitrogen, Ur: 920 mg/dL

## 2017-12-08 MED ORDER — TAMSULOSIN HCL 0.4 MG PO CAPS
0.4000 mg | ORAL_CAPSULE | Freq: Every day | ORAL | Status: DC
  Administered 2017-12-09 – 2017-12-15 (×7): 0.4 mg via ORAL
  Filled 2017-12-08 (×7): qty 1

## 2017-12-08 MED ORDER — GERHARDT'S BUTT CREAM
TOPICAL_CREAM | Freq: Three times a day (TID) | CUTANEOUS | Status: DC
  Administered 2017-12-08 (×2): via TOPICAL
  Administered 2017-12-08: 1 via TOPICAL
  Administered 2017-12-09 – 2017-12-15 (×16): via TOPICAL
  Filled 2017-12-08 (×3): qty 1

## 2017-12-08 MED ORDER — COLLAGENASE 250 UNIT/GM EX OINT
TOPICAL_OINTMENT | Freq: Every day | CUTANEOUS | Status: DC
  Administered 2017-12-08 – 2017-12-14 (×5): via TOPICAL
  Filled 2017-12-08 (×2): qty 30

## 2017-12-08 MED ORDER — HYDROCERIN EX CREA
TOPICAL_CREAM | Freq: Two times a day (BID) | CUTANEOUS | Status: DC
  Administered 2017-12-08 – 2017-12-14 (×11): via TOPICAL
  Filled 2017-12-08 (×3): qty 113

## 2017-12-08 NOTE — Progress Notes (Signed)
ANTIBIOTIC CONSULT NOTE   Pharmacy Consult for Cefepime Indication: wound infection, cover PSA   Allergies  Allergen Reactions  . Oxytetracycline Itching and Swelling    Facial swelling  . Sulfa Antibiotics Rash    Patient Measurements: Height:  (180.3 cm) Weight: 200 lb 12.8 oz (91.1 kg) IBW/kg (Calculated) : 75.3 Adjusted Body Weight:   Vital Signs: Temp: 97.6 F (36.4 C) (05/26 0504) Temp Source: Oral (05/26 0504) BP: 105/70 (05/26 0504) Pulse Rate: 92 (05/26 0504) Intake/Output from previous day: 05/25 0701 - 05/26 0700 In: 1642.3 [P.O.:786; I.V.:756.3; IV Piggyback:100] Out: 150 [Urine:150] Intake/Output from this shift: Total I/O In: 850 [P.O.:850] Out: 1400 [Urine:1400]  Labs: Recent Labs    12/06/17 1227 12/07/17 0550 12/07/17 1435 12/08/17 0347  WBC 17.7* 14.9*  --  12.2*  HGB 13.2 11.6*  --  12.1*  PLT 225 209  --  200  LABCREA  --   --  109.21  --   CREATININE 1.73* 1.21  --  1.20   Estimated Creatinine Clearance: 75.6 mL/min (by C-G formula based on SCr of 1.2 mg/dL). No results for input(s): VANCOTROUGH, VANCOPEAK, VANCORANDOM, GENTTROUGH, GENTPEAK, GENTRANDOM, TOBRATROUGH, TOBRAPEAK, TOBRARND, AMIKACINPEAK, AMIKACINTROU, AMIKACIN in the last 72 hours.   Microbiology: No results found for this or any previous visit (from the past 720 hour(s)).  Medical History: Past Medical History:  Diagnosis Date  . Cellulitis    mild.  L leg  . CHF (congestive heart failure) (HCC)    Dr. Norman Herrlich  . 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  . Hand fracture, right    Dr. Linda Hedges  . Hypothyroidism (acquired)   . Leg ulcer, left (HCC)   . PAD (peripheral artery disease) (HCC)   . Periorbital hematoma, right   . Peripheral neuropathy   . Toe ulcer (HCC)    Left  . Wound of left leg    Non-healing. Dr. Liston Alba, Dr. Bynum Bellows.   Assessment:  ID: Sepsis with Severe wound infection, B LE cellulits, gangrene of L foot  toes, (Vanco/Clinda/Aztreonam at OSH), Afebrile. Vascular surgery consult - patient had culture from wound swab that grew Cipro resistant Pseudomonas recently (reportedly in CareEverywhere).  These are longstanding wounds, for which patient was being seen at [facility], wound clinic.  He has had 2 pseudomonas cultures from surface swabs. - Afebrile, Scr 1.2, CrCl 75 stable  Vanco 5/24>5/25 Aztreonam 5/24>>5/25 Cefepime 5/25>>  Goal of Therapy:  Eradication of infection  Plan:  Cefepime 2g IV q 8 hrs Pharmacy will sign off. Please reconsult for further dosing assitance.   Tye Vigo S. Merilynn Finland, PharmD, BCPS Clinical Staff Pharmacist Pager [phone]  Misty Stanley Stillinger 12/08/2017,2:04 PM

## 2017-12-08 NOTE — Progress Notes (Signed)
Bladder scan shows >981ml. MD notified.

## 2017-12-08 NOTE — Progress Notes (Signed)
PROGRESS NOTE    Peter Cochran  WUJ:[id number] DOB: 03-09-1956 DOA: 12/06/2017 PCP: Peter Raveling, FNP      Brief Narrative:  Mr. Peter Cochran is a 62 y.o. M with CHF EF 25%, PVD, chronic venous and arterial ulcers, hypothyroidism and hypertension who presented to OSH 1 weeks ago with syncope, found to have sepsis from left leg wound infection, treated and stabilized on vancomycin and aztreonam, developed AKI, was referred for vascular surgery evaluation and [facility], refused transport and drove himself to [facility].   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  Also, patient had culture from wound swab that grew Cipro resistant Pseudomonas recently (reportedly in CareEverywhere).  These are longstanding wounds, for which patient was being seen at [facility], wound clinic.  He has had 2 pseudomonas cultures from surface swabs.    -Continue cefepime -Consult vascular surgery, appreciate cares -Consult nutrition -Consult WOC      Acute kidney injury Baseline Cr 0.7.  Was more than doubled at 1.9 at OSH, 1.7 on admission yesterday, but has improved with IV fluids overnight.  Fena 0.1%, Feurea 22%, pre-renal.  Stop fluids but hold diuretics an additional day.  Renal US shows mild bladder distension.   -Check post-void residual -Hold Entresto -Hold diuretics one more day  -Repeat BMP tomorrow  Hypertension -Continue beta-blocker, Lipitor -Hold diuretics, Entresto  Chronic systolic CHF Appears somewhat volume up actually.  Not sure if the AKI is congestive or pre-renal.  Will give fluids and hold diuretics and monitor AKI.  Net +2 L on admission. -Hold Lasix, spironolactone, Entresto given resolving AKI -Continue beta-blocker -Daily weights -Strict I.Os  Hypothyroidism -Continue levothyroxine  Depression -Continue Cymbalta       DVT  prophylaxis: Lovenox Code Status: FULL Family Communication: Wife at bedside MDM and disposition Plan: The below labs and imaging reports were reviewed and summarized above, outside records were reviewed and summarized above.  He was initially admitted with sepsis at the OSH.  His sepsis was stabilized and IV antibiotics were started empirically, and he improved.  He developed AKI which is now resolving.  But this appears to be prerenal, and his diuretics are being held.  He has a limb-threatnening infection, and Vascular surgery has been consutled regarding the possibility of revascularization.  Angiography will be planned for Thursday at this point in time, and he will remain in the hospital for IV antibiotics due to the resistant nature of his infection.   Consultants:   Vascular surgery  Procedures:   None  Antimicrobials:   Aztreonam 5/24 >> 5/25  Vancomycin x1 on 5/24  Cefepime 5/25 >>   Subjective: Patient has pain in his legs, worse in the left especially when he raises them or walks.  No fever, confusion, dizziness, suprapubic pain, flank pain.  No vomiting, dizziness.   Swelling stable.  Objective: Vitals:   12/07/17 1330 12/07/17 2024 12/08/17 0504 12/08/17 0753  BP: 96/72 97/76 105/70   Pulse: 73 98 92   Resp: 18     Temp: 97.9 F (36.6 C) 97.7 F (36.5 C) 97.6 F (36.4 C)   TempSrc: Oral Oral Oral   SpO2: 100% 98% 90%   Weight:    91.1 kg (200 lb 12.8 oz)  Height:        Intake/Output Summary (Last 24 hours) at 12/08/2017 1106 Last data filed at 12/07/2017 1900 Gross per  24 hour  Intake 1300.25 ml  Output 150 ml  Net 1150.25 ml   Filed Weights   12/06/17 1922 12/08/17 0753  Weight: 98.4 kg (217 lb) 91.1 kg (200 lb 12.8 oz)    Examination: General appearance: Older adult male, sitting up in a chair, interactive, no acute distress. HEENT: Anicteric, conjunctive are pink, lids and lashes normal.  No nasal deformity, discharge, or epistaxis.  Lips  moist, teeth poor dentition.  No oral lesions, OP moist, hearing normal. Skin: Skin is warm and dry, without jaundice.  He has chronic venous insufficiency changes of brawny edema to both lower extremities.  There is also dry gangrene of the left first, second, and third digits.  As well as several ulcers, pictured and Dr. Nicky Pugh notes from yesterday, these are unchanged from yesterday. Cardiac: RRR, no murmurs appreciated.  No carotid bruits.  2+ lower extremity edema.  Unchanged from yesterday. Respiratory: No rales or wheezes.  Some coarse expiratory breath sounds.  Normal respiratory effort. Abdomen: Abdomen soft.  No TTP. No ascites, distension, hepatosplenomegaly.  Large groin hernia. MSK: No deformities or effusions of the large joints of the arms or legs bilaterally. Neuro: Awake and alert, extraocular movements intact, moves all extremities with normal strength, speech fluent.    Psych: Oriented to person, place, and time.  Attention normal.  Affect normal.  Judgment and insight appear normal.    Data Reviewed: I have personally reviewed following labs and imaging studies:  CBC: Recent Labs  Lab 12/06/17 1227 12/07/17 0550 12/08/17 0347  WBC 17.7* 14.9* 12.2*  NEUTROABS 15.7*  --   --   HGB 13.2 11.6* 12.1*  HCT 39.1 35.2* 36.3*  MCV 92.0 94.6 94.3  PLT 225 209 200   Basic Metabolic Panel: Recent Labs  Lab 12/06/17 1227 12/07/17 0550 12/08/17 0347  NA 135 135 137  K 4.3 4.2 4.6  CL 104 109 110  CO2 18* 17* 18*  GLUCOSE 102* 96 102*  BUN 60* 51* 41*  CREATININE 1.73* 1.21 1.20  CALCIUM 9.0 8.7* 8.5*   GFR: Estimated Creatinine Clearance: 75.6 mL/min (by C-G formula based on SCr of 1.2 mg/dL). Liver Function Tests: Recent Labs  Lab 12/06/17 1227 12/07/17 0550  AST 25 22  ALT 21 18  ALKPHOS 70 63  BILITOT 0.6 0.6  PROT 7.8 7.1  ALBUMIN 2.8* 2.4*   No results for input(s): LIPASE, AMYLASE in the last 168 hours. No results for input(s): AMMONIA in the last  168 hours. Coagulation Profile: Recent Labs  Lab 12/07/17 0550  INR 1.11   Cardiac Enzymes: No results for input(s): CKTOTAL, CKMB, CKMBINDEX, TROPONINI in the last 168 hours. BNP (last 3 results) No results for input(s): PROBNP in the last 8760 hours. HbA1C: Recent Labs    12/07/17 0808  HGBA1C 6.0*   CBG: No results for input(s): GLUCAP in the last 168 hours. Lipid Profile: No results for input(s): CHOL, HDL, LDLCALC, TRIG, CHOLHDL, LDLDIRECT in the last 72 hours. Thyroid Function Tests: No results for input(s): TSH, T4TOTAL, FREET4, T3FREE, THYROIDAB in the last 72 hours. Anemia Panel: No results for input(s): VITAMINB12, FOLATE, FERRITIN, TIBC, IRON, RETICCTPCT in the last 72 hours. Urine analysis:    Component Value Date/Time   COLORURINE YELLOW 12/06/2017 2100   APPEARANCEUR HAZY (A) 12/06/2017 2100   LABSPEC 1.017 12/06/2017 2100   PHURINE 5.0 12/06/2017 2100   GLUCOSEU NEGATIVE 12/06/2017 2100   HGBUR MODERATE (A) 12/06/2017 2100   BILIRUBINUR NEGATIVE 12/06/2017 2100  KETONESUR NEGATIVE 12/06/2017 2100   PROTEINUR NEGATIVE 12/06/2017 2100   NITRITE NEGATIVE 12/06/2017 2100   LEUKOCYTESUR NEGATIVE 12/06/2017 2100   Sepsis Labs: (procalcitonin:4,lacticacidven:4)  )No results found for this or any previous visit (from the past 240 hour(s)).       Radiology Studies: US Renal  Result Date: 12/08/2017 CLINICAL DATA:  Acute kidney injury. EXAM: RENAL / URINARY TRACT ULTRASOUND COMPLETE COMPARISON:  None. FINDINGS: Right Kidney: Length: 11.3 cm. Echogenicity within normal limits. Mild hydronephrosis. No mass. Left Kidney: Length: 11.8 cm. Echogenicity within normal limits. Mild hydronephrosis. No mass. Bladder: Appears normal for degree of bladder distention. IMPRESSION: 1. Mild bilateral hydronephrosis. Electronically Signed   By: Signa Kell M.D.   On: 12/08/2017 09:30   Dg Foot Complete Left  Result Date: 12/06/2017 CLINICAL DATA:  Legs are  full of fluid EXAM: LEFT FOOT - COMPLETE 3+ VIEW COMPARISON:  10/19/2017 FINDINGS: No fracture or malalignment. Mild degenerative change at the first MTP joint. No periostitis or bone destruction. No soft tissue gas IMPRESSION: No acute osseous abnormality Electronically Signed   By: Jasmine Pang M.D.   On: 12/06/2017 20:21   Dg Foot Complete Right  Result Date: 12/06/2017 CLINICAL DATA:  Legs are full of fluid EXAM: RIGHT FOOT COMPLETE - 3+ VIEW COMPARISON:  12/02/2017 FINDINGS: No fracture or malalignment. Small plantar calcaneal spur. Soft tissue edema. No soft tissue gas or foreign body IMPRESSION: No acute osseous abnormality Electronically Signed   By: Jasmine Pang M.D.   On: 12/06/2017 20:22        Scheduled Meds: . aspirin EC  81 mg Oral Daily  . atorvastatin  40 mg Oral Daily  . carvedilol  3.125 mg Oral BID WC  . collagenase   Topical Daily  . DULoxetine  30 mg Oral Daily  . enoxaparin (LOVENOX) injection  40 mg Subcutaneous Daily  . feeding supplement (ENSURE ENLIVE)  237 mL Oral BID BM  . Gerhardt's butt cream   Topical TID  . hydrocerin   Topical BID  . levothyroxine  100 mcg Oral QAC breakfast  . multivitamin with minerals  1 tablet Oral Daily  . nutrition supplement (JUVEN)  1 packet Oral BID BM   Continuous Infusions: . ceFEPime (MAXIPIME) IV Stopped (12/08/17 0845)     LOS: 2 days    Time spent: 25 minutes    Alberteen Sam, MD [facility] 12/08/2017, 8:45 AM     Pager [phone] --- please page though AMION:  [email or web address] Password [email or web address] If 7PM-7AM, please contact night-coverage

## 2017-12-08 NOTE — Consult Note (Signed)
WOC Nurse wound consult note Reason for Consult: WOC Nursing simultaneously consulted with [facility].  Dr. Imogene Burn saw patient last evening and is planning for a interventional procedure later this week that may or may not result in an amputation of the LLE. I will provide orders for conservative topical care to the Left LE, the right LE and the coccygeal Unstageable ulcer that patient believes is 20 years in duration and began with a poliocidal cyst removal. There is also significant incontinence associated dermatitis of the bilateral buttocks and a partial thickness ulceration of the left IT.  Note:  Patient does not tolerate LE elevation due to perfusion status nor has he slept in a bed in "years". Patient has been followed at an outpatient [facility]. Wound type: Mixed etiology vs venous insufficiency (Clinical CEAP 6), infection, moisture and (for coccyx), Pressure  Pressure Injury POA: Yes Measurement:  Left LE with circumferential full and partial thickness tissue loss. Two toes on left foot (1-2 digits) with black discoloration. Dependent rubor vs hemosiderin staining. Dystrophic toenails and cracks on heels. Left lateral LE with ulceration. Coccyx:  4cm x 1.5cm wound with white nonviable tissue obscuring wound depth. Surrounding tissue with partial thickness IAD and very slow to blanch erythematous area measuring 3cm round at left IT. Left LE: Pink, weeping large amounts of serous fluid  Coccyx and buttocks with small amount of serous exudate Dressing procedure/placement/frequency: I have provided a pressure redistribution chair cushion since he does not return to bed. Wound care for the coccyx will be with an enzymatic debriding agent [facility]) and the surrounding skin will be treated with Gerhart's Butt cream.  The left LE will be treated conservatively twice daily with xeroform gauze and this will be secured with Kerlix roll gauze. The right LE will be treated with an emollient, Eucerin.  I agree with Dr. Imogene Burn  that referral to an outpatient [facility] or to Dr. Lajoyce Corners for continued management of the LLE lateral ulceration would be beneficial. IF you agree, please consult/ref following procedure on Thursday.  WOC nursing team will not follow, but will remain available to this patient, the nursing and medical teams.  Please re-consult if needed. Thanks, Ladona Mow, MSN, RN, GNP, [place]  Pager# [phone]

## 2017-12-09 ENCOUNTER — Inpatient Hospital Stay (HOSPITAL_COMMUNITY): Payer: PRIVATE HEALTH INSURANCE

## 2017-12-09 LAB — CBC
HEMATOCRIT: 34.2 % — AB (ref 39.0–52.0)
Hemoglobin: 11.2 g/dL — ABNORMAL LOW (ref 13.0–17.0)
MCH: 31.5 pg (ref 26.0–34.0)
MCHC: 32.7 g/dL (ref 30.0–36.0)
MCV: 96.1 fL (ref 78.0–100.0)
PLATELETS: 180 10*3/uL (ref 150–400)
RBC: 3.56 MIL/uL — ABNORMAL LOW (ref 4.22–5.81)
RDW: 15.5 % (ref 11.5–15.5)
WBC: 10.7 10*3/uL — AB (ref 4.0–10.5)

## 2017-12-09 LAB — BASIC METABOLIC PANEL
Anion gap: 7 (ref 5–15)
BUN: 28 mg/dL — AB (ref 6–20)
CHLORIDE: 110 mmol/L (ref 101–111)
CO2: 20 mmol/L — ABNORMAL LOW (ref 22–32)
CREATININE: 0.94 mg/dL (ref 0.61–1.24)
Calcium: 8.5 mg/dL — ABNORMAL LOW (ref 8.9–10.3)
GFR calc Af Amer: >60 mL/min (ref >60)
GLUCOSE: 111 mg/dL — AB (ref 65–99)
Potassium: 4.6 mmol/L (ref 3.5–5.1)
SODIUM: 137 mmol/L (ref 135–145)

## 2017-12-09 MED ORDER — SPIRONOLACTONE 25 MG PO TABS
25.0000 mg | ORAL_TABLET | Freq: Every day | ORAL | Status: DC
  Administered 2017-12-09 – 2017-12-15 (×7): 25 mg via ORAL
  Filled 2017-12-09 (×7): qty 1

## 2017-12-09 MED ORDER — FUROSEMIDE 10 MG/ML IJ SOLN
40.0000 mg | Freq: Every day | INTRAMUSCULAR | Status: DC
  Administered 2017-12-09 – 2017-12-10 (×2): 40 mg via INTRAVENOUS
  Filled 2017-12-09 (×2): qty 4

## 2017-12-09 NOTE — Progress Notes (Addendum)
PROGRESS NOTE    Peter Cochran  UJW:[id number] DOB: 1956/03/03 DOA: 12/06/2017 PCP: Floria Raveling, FNP      Brief Narrative:  Peter Cochran is a 62 y.o. M with CHF EF 25%, PVD, chronic venous and arterial ulcers, hypothyroidism and hypertension who presented to OSH 1 weeks ago with syncope, found to have sepsis from left leg wound infection, treated and stabilized on vancomycin and aztreonam, developed AKI, was referred for vascular surgery evaluation and [facility], refused transport and drove himself to [facility].   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  Also, patient had culture from wound swab that grew Cipro resistant Pseudomonas recently (reportedly in CareEverywhere).  These are longstanding wounds, for which patient was being seen at [facility], wound clinic.    Patient was referred from the wound clinic at [facility] to infectious disease at [facility] recently.  He had a surface swab of his leg in early April that grew heavy Pseudomonas, pan susceptible, but a repeat swab (See scanned below) that was growing Cipro resistant Pseudomonas.    At [place] earlier during this hospitalization, he was being treated wthi vancomycin, Aztreonam and Flagyl and was improving.  He has not been getting anaerobic coverage here.  -Continue cefepime -Consult vascular surgery, appreciate cares; they plan to do angiography Thursday and amputation of digits after that -I will consult ID tomorrow re: antibiotic therapy after amputation -Consult nutrition -Consult WOC      Acute kidney injury Baseline Cr 0.7.  Was more than doubled at 1.9 at OSH, 1.7 on admission yesterday, but has improved with IV fluids overnight.  Fena 0.1%, Feurea 22%, pre-renal.  US showed hydronephrosis.   Restart diruetics today.  Foley in place.   -Hold Entresto one more day -Lasix  40 IV -Daily BMP  Bladder outlet obstruction Foley in place. -Start Flomax -Voiding trial in 3 days  Hypertension -Continue BB, Lipitor -Hold Entresto -Restart diuretics    Chronic systolic CHF Appears somewhat volume up actually.  Not sure if the AKI is congestive or pre-renal.  Will give fluids and hold diuretics and monitor AKI.  Net +2 L on admission. -Furosemide 40 mg IV adily -K supplement -Strict I/Os, daily weights, telemetry  -Daily monitoring renal function -Continue beta blocker, spiro -Hold Entresto one more day  Hypothyroidism -Continue levothyroxine  Depression -Continue Cymbalta       DVT prophylaxis: Lovenox Code Status: FULL Family Communication: Wife at bedside MDM and disposition Plan: The below labs and imaging reports were reviewed and summarize dabove.  His outside records were reviewed and symmarized above.    He was initially admitted with sepsis at the OSH.  His sepsis was stabilized and IV antibiotics were started empirically, and he improved.  He developed AKI which is now resolving.  But this appears to be pre-renal and from obstruction, resolved, diuretics to be restarted.  He has a limb-threatnening infection, and Vascular surgery has been consutled regarding the possibility of revascularization.  Angiography will be planned for Thursday at this point in time, and he will remain in the hospital for IV antibiotics due to the resistant nature of his infection.  Vascular surgery recommend amputation after that.    Consultants:   Vascular surgery  Procedures:   None  Antimicrobials:   Aztreonam 5/24 >> 5/25  Vancomycin x1 on 5/24  Cefepime 5/25 >>   Subjective: Patient  noticing some more swelling in his legs.  Pain in legs unchanged.  Worse with walking or elevating them.  No weeping of legs today.  No confusion, fever, vomitnig.  No flank pain.  No dyspnea.  Feels better with foley in place.  Objective: Vitals:   12/08/17  2006 12/08/17 2051 12/09/17 0425 12/09/17 0756  BP: (!) 89/60 118/64 91/71 111/66  Pulse: (!) 101 97 94 98  Resp: 17     Temp: 98.2 F (36.8 C)  97.8 F (36.6 C)   TempSrc: Oral  Oral   SpO2: 95%  100%   Weight:      Height:        Intake/Output Summary (Last 24 hours) at 12/09/2017 0954 Last data filed at 12/09/2017 0546 Gross per 24 hour  Intake 1300 ml  Output 2500 ml  Net -1200 ml   Filed Weights   12/06/17 1922 12/08/17 0753  Weight: 98.4 kg (217 lb) 91.1 kg (200 lb 12.8 oz)    Examination: General appearance: Adult male, sitting up in chair, interactive, no acute distress. HEENT: Anicteric, conjunctival pink, lids and lashes normal.  No nasal deformity, discharge, or epistaxis.  Lips moist, poor dentition.  No oral lesions, OP dry, hearing normal. Skin: Warm and dry without jaundice.  She has chronic venous insufficiency changes and brawny edema as described yesterday.  There is also dry gangrene of the left first second and third digits. Cardiac: Regular rate and rhythm, no murmurs, no carotid bruits, 2+ lower extremity edema Respiratory: No rales or wheezes, normal respiratory effort. Abdomen: Abdomen soft.  No TTP. No ascites, distension, hepatosplenomegaly.  Large groin hernia. MSK: No deformities or effusions of the large joints of the arms or legs bilaterally.  Gangrene of the left toes as described above. Neuro: Oriented to person place and time, extraocular movements intact, moves all extremities with symmetric strength, speech fluent.    Psych: Attention and affect normal.  Judgment and insight normal.    Data Reviewed: I have personally reviewed following labs and imaging studies:  CBC: Recent Labs  Lab 12/06/17 1227 12/07/17 0550 12/08/17 0347 12/09/17 0507  WBC 17.7* 14.9* 12.2* 10.7*  NEUTROABS 15.7*  --   --   --   HGB 13.2 11.6* 12.1* 11.2*  HCT 39.1 35.2* 36.3* 34.2*  MCV 92.0 94.6 94.3 96.1  PLT 225 209 200 180   Basic Metabolic  Panel: Recent Labs  Lab 12/06/17 1227 12/07/17 0550 12/08/17 0347 12/09/17 0507  NA 135 135 137 137  K 4.3 4.2 4.6 4.6  CL 104 109 110 110  CO2 18* 17* 18* 20*  GLUCOSE 102* 96 102* 111*  BUN 60* 51* 41* 28*  CREATININE 1.73* 1.21 1.20 0.94  CALCIUM 9.0 8.7* 8.5* 8.5*   GFR: Estimated Creatinine Clearance: 96.5 mL/min (by C-G formula based on SCr of 0.94 mg/dL). Liver Function Tests: Recent Labs  Lab 12/06/17 1227 12/07/17 0550  AST 25 22  ALT 21 18  ALKPHOS 70 63  BILITOT 0.6 0.6  PROT 7.8 7.1  ALBUMIN 2.8* 2.4*   No results for input(s): LIPASE, AMYLASE in the last 168 hours. No results for input(s): AMMONIA in the last 168 hours. Coagulation Profile: Recent Labs  Lab 12/07/17 0550  INR 1.11   Cardiac Enzymes: No results for input(s): CKTOTAL, CKMB, CKMBINDEX, TROPONINI in the last 168 hours. BNP (last 3 results) No results for input(s): PROBNP in the last 8760 hours. HbA1C: Recent Labs    12/07/17 0808  HGBA1C  6.0*   CBG: No results for input(s): GLUCAP in the last 168 hours. Lipid Profile: No results for input(s): CHOL, HDL, LDLCALC, TRIG, CHOLHDL, LDLDIRECT in the last 72 hours. Thyroid Function Tests: No results for input(s): TSH, T4TOTAL, FREET4, T3FREE, THYROIDAB in the last 72 hours. Anemia Panel: No results for input(s): VITAMINB12, FOLATE, FERRITIN, TIBC, IRON, RETICCTPCT in the last 72 hours. Urine analysis:    Component Value Date/Time   COLORURINE YELLOW 12/06/2017 2100   APPEARANCEUR HAZY (A) 12/06/2017 2100   LABSPEC 1.017 12/06/2017 2100   PHURINE 5.0 12/06/2017 2100   GLUCOSEU NEGATIVE 12/06/2017 2100   HGBUR MODERATE (A) 12/06/2017 2100   BILIRUBINUR NEGATIVE 12/06/2017 2100   KETONESUR NEGATIVE 12/06/2017 2100   PROTEINUR NEGATIVE 12/06/2017 2100   NITRITE NEGATIVE 12/06/2017 2100   LEUKOCYTESUR NEGATIVE 12/06/2017 2100   Sepsis Labs: (procalcitonin:4,lacticacidven:4)  )No results found for this or any previous  visit (from the past 240 hour(s)).                      Radiology Studies: US Renal  Result Date: 12/08/2017 CLINICAL DATA:  Acute kidney injury. EXAM: RENAL / URINARY TRACT ULTRASOUND COMPLETE COMPARISON:  None. FINDINGS: Right Kidney: Length: 11.3 cm. Echogenicity within normal limits. Mild hydronephrosis. No mass. Left Kidney: Length: 11.8 cm. Echogenicity within normal limits. Mild hydronephrosis. No mass. Bladder: Appears normal for degree of bladder distention. IMPRESSION: 1. Mild bilateral hydronephrosis. Electronically Signed   By: Signa Kell M.D.   On: 12/08/2017 09:30        Scheduled Meds: . aspirin EC  81 mg Oral Daily  . atorvastatin  40 mg Oral Daily  . carvedilol  3.125 mg Oral BID WC  . collagenase   Topical Daily  . DULoxetine  30 mg Oral Daily  . enoxaparin (LOVENOX) injection  40 mg Subcutaneous Daily  . feeding supplement (ENSURE ENLIVE)  237 mL Oral BID BM  . Gerhardt's butt cream   Topical TID  . hydrocerin   Topical BID  . levothyroxine  100 mcg Oral QAC breakfast  . multivitamin with minerals  1 tablet Oral Daily  . nutrition supplement (JUVEN)  1 packet Oral BID BM  . tamsulosin  0.4 mg Oral QPC breakfast   Continuous Infusions: . ceFEPime (MAXIPIME) IV Stopped (12/09/17 0903)     LOS: 3 days    Time spent: 25 minutes    Alberteen Sam, MD [facility] 12/09/2017, 8:45 AM     Pager [phone] --- please page though AMION:  [email or web address] Password [email or web address] If 7PM-7AM, please contact night-coverage

## 2017-12-09 NOTE — Progress Notes (Addendum)
  Progress Note    12/09/2017 12:35 PM  Subjective:  No new complaints this morning   Vitals:   12/09/17 0425 12/09/17 0756  BP: 91/71 111/66  Pulse: 94 98  Resp:    Temp: 97.8 F (36.6 C)   SpO2: 100%    Physical Exam: Lungs:  Non labored on RA Extremities:  Dressing left in place LLE; palpable and symmetrical femoral pulses Abdomen:  Soft Neurologic: A&O  CBC    Component Value Date/Time   WBC 10.7 (H) 12/09/2017 0507   RBC 3.56 (L) 12/09/2017 0507   HGB 11.2 (L) 12/09/2017 0507   HCT 34.2 (L) 12/09/2017 0507   PLT 180 12/09/2017 0507   MCV 96.1 12/09/2017 0507   MCH 31.5 12/09/2017 0507   MCHC 32.7 12/09/2017 0507   RDW 15.5 12/09/2017 0507   LYMPHSABS 0.6 (L) 12/06/2017 1227   MONOABS 1.2 (H) 12/06/2017 1227   EOSABS 0.0 12/06/2017 1227   BASOSABS 0.0 12/06/2017 1227    BMET    Component Value Date/Time   NA 137 12/09/2017 0507   K 4.6 12/09/2017 0507   CL 110 12/09/2017 0507   CO2 20 (L) 12/09/2017 0507   GLUCOSE 111 (H) 12/09/2017 0507   BUN 28 (H) 12/09/2017 0507   CREATININE 0.94 12/09/2017 0507   CALCIUM 8.5 (L) 12/09/2017 0507   GFRNONAA >60 12/09/2017 0507   GFRAA >60 12/09/2017 0507    INR    Component Value Date/Time   INR 1.11 12/07/2017 0550     Intake/Output Summary (Last 24 hours) at 12/09/2017 1235 Last data filed at 12/09/2017 1000 Gross per 24 hour  Intake 1540 ml  Output 2500 ml  Net -960 ml     Assessment/Plan:  62 y.o. male with gangrenous toes L foot  Pt refusing ABI Plan is for LLE angiogram with possible revascularization under general anesthesia in hybrid OR on Thursday 12/12/17 Continue current IV antibiotic regimen Patient will also need at least amputation of L 1st and 2nd toes pending results of angiography   Emilie Rutter, PA-C [facility] [phone] 12/09/2017 12:35 PM   Addendum  I have independently interviewed and examined the patient, and I agree with the physician  assistant's findings.  Pt will be scheduled for General Anesthesia and Aortogram, bilateral leg runoff in the hybrid OR on Thursday  Leonides Sake, MD, [facility] [place] Office: [phone] Pager: [phone]  12/09/2017, 2:19 PM

## 2017-12-09 NOTE — Progress Notes (Signed)
VASCULAR LAB    Patient is unable to lay in bed or to elevate legs.  Patient has weeping, painful ulcers and bandages on legs.  Will not be able to obtain ABIs secondary to wounds and to patient's inability to raise legs.  ABIs will not be hemodynamically accurate.      Berlie Persky, RVT 12/09/2017, 12:54 PM

## 2017-12-09 NOTE — Social Work (Signed)
CSW acknowledging SNF consult, per PT note on 5/25- pt is refusing SNF (unless he is deemed to need an amputation.) CSW will continue to follow to support disposition.   Doy Hutching, LCSWA [facility] Clinical Social Work [phone]

## 2017-12-10 DIAGNOSIS — I739 Peripheral vascular disease, unspecified: Secondary | ICD-10-CM

## 2017-12-10 DIAGNOSIS — N179 Acute kidney failure, unspecified: Secondary | ICD-10-CM

## 2017-12-10 LAB — CBC
HCT: 35 % — ABNORMAL LOW (ref 39.0–52.0)
Hemoglobin: 11.4 g/dL — ABNORMAL LOW (ref 13.0–17.0)
MCH: 31.3 pg (ref 26.0–34.0)
MCHC: 32.6 g/dL (ref 30.0–36.0)
MCV: 96.2 fL (ref 78.0–100.0)
PLATELETS: 203 10*3/uL (ref 150–400)
RBC: 3.64 MIL/uL — AB (ref 4.22–5.81)
RDW: 15.7 % — ABNORMAL HIGH (ref 11.5–15.5)
WBC: 10.9 10*3/uL — AB (ref 4.0–10.5)

## 2017-12-10 LAB — BASIC METABOLIC PANEL
Anion gap: 7 (ref 5–15)
BUN: 24 mg/dL — ABNORMAL HIGH (ref 6–20)
CHLORIDE: 107 mmol/L (ref 101–111)
CO2: 25 mmol/L (ref 22–32)
Calcium: 8.7 mg/dL — ABNORMAL LOW (ref 8.9–10.3)
Creatinine, Ser: 1.05 mg/dL (ref 0.61–1.24)
GFR calc non Af Amer: >60 mL/min (ref >60)
Glucose, Bld: 110 mg/dL — ABNORMAL HIGH (ref 65–99)
POTASSIUM: 4.4 mmol/L (ref 3.5–5.1)
Sodium: 139 mmol/L (ref 135–145)

## 2017-12-10 MED ORDER — FUROSEMIDE 10 MG/ML IJ SOLN
40.0000 mg | Freq: Two times a day (BID) | INTRAMUSCULAR | Status: DC
  Administered 2017-12-10 – 2017-12-11 (×2): 40 mg via INTRAVENOUS
  Filled 2017-12-10 (×2): qty 4

## 2017-12-10 MED ORDER — PIPERACILLIN-TAZOBACTAM 3.375 G IVPB
3.3750 g | Freq: Three times a day (TID) | INTRAVENOUS | Status: DC
  Administered 2017-12-10 – 2017-12-15 (×14): 3.375 g via INTRAVENOUS
  Filled 2017-12-10 (×17): qty 50

## 2017-12-10 NOTE — Consult Note (Signed)
[facility]    Date of Admission:  12/06/2017   Total days of antibiotics 5        Day 4 cefepime         Vanc/Aztreonam 5/24       Reason for Consult: Chronic LE ulcer infection     Referring Provider: Dr. Maryfrances Bunnell  Primary Care Provider: Dr. Merita Norton   Assessment: Peter Cochran is a 62 y.o. male with history of CHF, PAD, chronic venous ulcers x 4 years, HTN, and hypothyroidism who presented on 5/24 with sepsis secondary to bilateral lower extremity cellulitis and gangrene of L 1st and 2nd toes. Wound cx from OSH with ciprofloxacin-resistant Pseudomonas. Initially treated with vancomycin, clindamycin, and aztreonam. He is now on cefepime and doing well. Has been afebrile and leukocytosis improving.   Plan: 1. Bilateral lower extremities cellulitis: Continue cefepime. Will need a total of 2 weeks of therapy. Has 9 days left.  2. PAD/chronic venous stasis ulcers/dry gangrene:  Vascular and ortho following. Plan for angiography with possible revascularization on 5/29 and possible L toe amputation on 5/30. Wound care following as well.   Active Problems:   Sepsis due to cellulitis (HCC)   Pressure injury of skin   Scheduled Meds: . aspirin EC  81 mg Oral Daily  . atorvastatin  40 mg Oral Daily  . carvedilol  3.125 mg Oral BID WC  . collagenase   Topical Daily  . DULoxetine  30 mg Oral Daily  . enoxaparin (LOVENOX) injection  40 mg Subcutaneous Daily  . feeding supplement (ENSURE ENLIVE)  237 mL Oral BID BM  . furosemide  40 mg Intravenous Daily  . Gerhardt's butt cream   Topical TID  . hydrocerin   Topical BID  . levothyroxine  100 mcg Oral QAC breakfast  . multivitamin with minerals  1 tablet Oral Daily  . nutrition supplement (JUVEN)  1 packet Oral BID BM  . spironolactone  25 mg Oral Daily  . tamsulosin  0.4 mg Oral QPC breakfast   Continuous Infusions: . ceFEPime (MAXIPIME) IV Stopped (12/10/17 0907)   PRN Meds:.bisacodyl,  HYDROcodone-acetaminophen, magnesium citrate, ondansetron **OR** ondansetron (ZOFRAN) IV, senna-docusate  HPI: Peter Cochran is a 62 y.o. male with history of CHF, PAD, chronic venous ulcers x 4 years, HTN, and hypothyroidism who presented on 5/24 with sepsis secondary to bilateral lower extremity cellulitis and gangrene of L 1st and 2nd toes. Patient follows up at wound care center in Home who referred him to ID for further management of chronic LE wound infection. Per note, he completed a course of Bactrim on 10/2017 and was switched to ciprofloxacin. Plan for IV antibiotic therapy at the time but concern of poor penetration in the setting of poor blood flow to lower extremities and he was referred to vascular surgery for evaluation for revascularization. His most recent wound culture grew ciprofloxacin resistant Pseudomonas. Bcx 5/14 negative. Patient presented to [facility] on 5/20 after noticing 2 black toes on his L foot. He was also noted to have bilateral LE cellulitis. Prior to and during his admission, he denies experiencing fever, chills, night sweats, chest pain, SOB, abdominal pain, N/V, and diarrhea. He was treated with vancomycin, clindamycin, and aztreonam. He was transferred to [facility] for vascular evaluation. He is now on cefepime day 5. Vascular surgery has been consulted with plan for angiography and possible revascularization on 5/30 followed by amputation of L 1st and 2nd toes possibly  on 5/31.   Review of Systems: Review of Systems  Constitutional: Negative for chills, fever and malaise/fatigue.  Respiratory: Negative for cough and shortness of breath.   Cardiovascular: Positive for leg swelling. Negative for chest pain and palpitations.  Gastrointestinal: Negative for abdominal pain, diarrhea, nausea and vomiting.  Musculoskeletal: Positive for myalgias.  Neurological: Negative for dizziness and headaches.    Past Medical History:  Diagnosis Date  . Cellulitis    mild.  L  leg  . CHF (congestive heart failure) (HCC)    Dr. Norman Herrlich  . 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  . Hand fracture, right    Dr. Linda Hedges  . Hypothyroidism (acquired)   . Leg ulcer, left (HCC)   . PAD (peripheral artery disease) (HCC)   . Periorbital hematoma, right   . Peripheral neuropathy   . Toe ulcer (HCC)    Left  . Wound of left leg    Non-healing. Dr. Liston Alba, Dr. Bynum Bellows.    Social History   Tobacco Use  . Smoking status: Current Every Day Smoker    Packs/day: 2.00    Types: Cigarettes  . Smokeless tobacco: Never Used  Substance Use Topics  . Alcohol use: Not Currently  . Drug use: Not Currently    Family History  Problem Relation Age of Onset  . Hypertension Mother   . Hyperlipidemia Mother   . Heart attack Father   . Diabetes Father   . Hypertension Father   . Hyperlipidemia Father   . Stroke Maternal Grandmother    Allergies  Allergen Reactions  . Oxytetracycline Itching and Swelling    Facial swelling  . Sulfa Antibiotics Rash    OBJECTIVE: Blood pressure (!) 98/45, pulse (!) 101, temperature 98.5 F (36.9 C), temperature source Oral, resp. rate 18, height  (1.803 m), weight 208 lb 8 oz (94.6 kg), SpO2 100 %.  Physical Exam  Constitutional: He is oriented to person, place, and time.  Elderly male, well-nourished, well-developed, in no acute distress   HENT:  Head: Normocephalic and atraumatic.  Mouth/Throat: Oropharynx is clear and moist.  Eyes: Pupils are equal, round, and reactive to light. Conjunctivae are normal.  Neck: Normal range of motion. Neck supple.  Cardiovascular: Normal rate, regular rhythm and normal heart sounds. Exam reveals no gallop and no friction rub.  No murmur heard. Pulmonary/Chest: Breath sounds normal. No respiratory distress.  Abdominal: Soft. Bowel sounds are normal. There is no tenderness.  Musculoskeletal: He exhibits edema.  Neurological: He is alert and oriented to person,  place, and time.  Skin:  Bilateral lower extremities are cool to the touch and erythematous and edematous below the knee. Gangrenous L 1st and 2nd toes noted. No discharge appreciated but LLE bandaged.     Lab Results Lab Results  Component Value Date   WBC 10.9 (H) 12/10/2017   HGB 11.4 (L) 12/10/2017   HCT 35.0 (L) 12/10/2017   MCV 96.2 12/10/2017   PLT 203 12/10/2017    Lab Results  Component Value Date   CREATININE 1.05 12/10/2017   BUN 24 (H) 12/10/2017   NA 139 12/10/2017   K 4.4 12/10/2017   CL 107 12/10/2017   CO2 25 12/10/2017    Lab Results  Component Value Date   ALT 18 12/07/2017   AST 22 12/07/2017   ALKPHOS 63 12/07/2017   BILITOT 0.6 12/07/2017     Microbiology: No results found for this or any previous visit (from  the past 240 hour(s)).  Burna Cash, MD [facility] [phone] pager   [phone] cell 12/10/2017, 9:33 AM

## 2017-12-10 NOTE — Progress Notes (Signed)
Physical Therapy Treatment Patient Details Name: Peter Cochran MRN: [id number] DOB: 08/05/1955 Today's Date: 12/10/2017    History of Present Illness Pt. is a 62 y.o. M with significant PMH of CHF and PAD who presents for evaluation of wound infection.      PT Comments    Pt continuing to work on pressure relief and strengthening. Stood to stedy and maintained combination of standing and sitting on it for >20 mins. Pt's standing tolerance limited by burning in feet as well as generalized weakness. Worked on standing with fully erect posture but pt able to tolerate for bouts of only 20 secs at a time. PT will continue to follow.    Follow Up Recommendations  SNF     Equipment Recommendations  None recommended by PT    Recommendations for Other Services       Precautions / Restrictions Precautions Precautions: Fall Restrictions Weight Bearing Restrictions: No    Mobility  Bed Mobility               General bed mobility comments: pt remains in chair  Transfers Overall transfer level: Needs assistance Equipment used: Ambulation equipment used Transfers: Sit to/from Stand Sit to Stand: +2 physical assistance;Min assist         General transfer comment: min A +2 for power up, performed 1x from recliner and then 5x from stedy  Ambulation/Gait             General Gait Details: unable   Stairs             Wheelchair Mobility    Modified Rankin (Stroke Patients Only)       Balance Overall balance assessment: Needs assistance Sitting-balance support: Bilateral upper extremity supported;Feet supported Sitting balance-Leahy Scale: Fair Sitting balance - Comments: maintained sitting on stedy for >15 mins for change in pressure points. Wheeled down to window and let him sit in the sun and practice his standing there to help bolster his mood. He was very Adult nurse.    Standing balance support: Bilateral upper extremity supported Standing  balance-Leahy Scale: Poor Standing balance comment: heavily reliant on UE support, vc's for erect posture in standing as pt tends to lean on forearms. Maintained standing with fwd lean 5x for bouts ranging from 1 min to 5 mins. Maintained standing for RN to change dressing on buttocks. Could maintain fully erect posture only 20 secs at a time before returning to elbows.                             Cognition Arousal/Alertness: Awake/alert Behavior During Therapy: WFL for tasks assessed/performed Overall Cognitive Status: No family/caregiver present to determine baseline cognitive functioning                                 General Comments: pt AxOx4 today      Exercises      General Comments General comments (skin integrity, edema, etc.): reviewed LE ther ex in chair as well as UE ROM. Pt hoping to be able to tolerate LE elevation after procedure later this week.       Pertinent Vitals/Pain Pain Assessment: Faces Faces Pain Scale: Hurts even more Pain Location: BLE Pain Descriptors / Indicators: Burning Pain Intervention(s): Limited activity within patient's tolerance;Monitored during session    Home Living  Prior Function            PT Goals (current goals can now be found in the care plan section) Acute Rehab PT Goals Patient Stated Goal: go home PT Goal Formulation: With patient Time For Goal Achievement: 12/21/17 Potential to Achieve Goals: Poor Progress towards PT goals: Progressing toward goals    Frequency    Min 2X/week      PT Plan Current plan remains appropriate    Cochran-evaluation              AM-PAC PT "6 Clicks" Daily Activity  Outcome Measure  Difficulty turning over in bed (including adjusting bedclothes, sheets and blankets)?: Unable Difficulty moving from lying on back to sitting on the side of the bed? : Unable Difficulty sitting down on and standing up from a chair with arms (e.g.,  wheelchair, bedside commode, etc,.)?: Unable Help needed moving to and from a bed to chair (including a wheelchair)?: Total Help needed walking in hospital room?: Total Help needed climbing 3-5 steps with a railing? : Total 6 Click Score: 6    End of Session Equipment Utilized During Treatment: Gait belt Activity Tolerance: Patient tolerated treatment well Patient left: in chair;with call bell/phone within reach Nurse Communication: Mobility status PT Visit Diagnosis: Other abnormalities of gait and mobility (R26.89);Muscle weakness (generalized) (M62.81);History of falling (Z91.81);Adult, failure to thrive (R62.7);Pain Pain - part of body: Leg     Time: 1131-1203 PT Time Calculation (min) (ACUTE ONLY): 32 min  Charges:  $Gait Training: 8-22 mins $Therapeutic Activity: 8-22 mins                    G Codes:       Peter Cochran, PT  Acute Rehab Services  [phone]    Peter Cochran 12/10/2017, 1:58 PM

## 2017-12-10 NOTE — Progress Notes (Signed)
PROGRESS NOTE    Shlomo Seres  ZOX:[id number] DOB: Jul 08, 1956 DOA: 12/06/2017 PCP: Floria Raveling, FNP      Brief Narrative:  Mr. Mccanless is a 62 y.o. M with CHF EF 25%, PVD, chronic venous and arterial ulcers, hypothyroidism and hypertension who presented to OSH 1 weeks ago with syncope, found to have sepsis from left leg wound infection, treated and stabilized on vancomycin and aztreonam, developed AKI, was referred for vascular surgery evaluation and [facility], refused transport and drove himself to [facility]   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   -Patient follows with podiatry in [place] -Surface of swab 4/16 grew Pseudomonas, pan susceptible - Surface swab repeated 4/29 grew Pseudomonas, Cipro resistant (see scanned below) -These are long-standing wounds, treated in the past with Unna boot, etc.     After his Pseudmonas cultures, patient was referred from the wound clinic at [place] to infectious disease at [facility] recently.  They recommended revascularization, then placement of PIC for IV antibiotics.  During the present hospitalization, the patient has not yet admitted to vascular surgery, when he developed dizziness and passed out.  His home health nurse found him groggy and with hypotension and present to the ER.  In the ER, he was still hypotensive, but responded to fluids.  He started on vancomycin, aztreonam, and clindamycin, and improved.    -Continue cefepime -Consult infectious disease regarding antibiotic choice, and duration of therapy for this patient will undergo amputation and revascularization soon -Consult vascular surgery, appreciate cares; they plan to do angiography Thursday   -Consult orthopedic surgery regarding amputation of the first 3 toes -Consult nutrition -Consult WOC      Acute kidney injury Baseline Cr  0.7.  Was more than doubled at 1.9 at OSH, 1.7 on admission yesterday, but has improved with IV fluids overnight.  Fena 0.1%, Feurea 22%, pre-renal.  US showed hydronephrosis.   Foley in place.  Net negative only 200 yesterday on IV Lasix. -Hold Entresto -Increase Lasix -Daily BMP  Chronic systolic CHF Still with weeping in his legs more swelling than usual. -Increase Lasix to twice daily -Continue potassium supplement  -Strict I/Os, daily weights, telemetry  -Daily monitoring renal function -Continue beta blocker, spiro -Hold Entresto for now  Bladder outlet obstruction Foley in place.  This is new bladder outlet obstruction, he was started on Flomax on Monday. -Continue Flomax  -Attempt voiding trial Thursday or Friday  Hypertension -Continue beta-blocker, Lipitor -Hold Entresto  Hypothyroidism -Continue levothyroxine  Depression -Continue Cymbalta       DVT prophylaxis: Lovenox Code Status: FULL Family Communication: Wife at bedside MDM and disposition Plan: The below labs and imaging reports were reviewed and summarized above.  His outside records were reviewed and summarized above.  He was initially admitted with sepsis at the OSH.  His sepsis was stabilized and IV antibiotics were started empirically, and he improved.  He developed AKI which is now resolving.     He has a limb-threatnening infection, and Vascular surgery has been consutled regarding the possibility of revascularization.  Angiography will be planned for Thursday at this point in time, and he will remain in the hospital for IV antibiotics due to the resistant nature of his infection.  Orthopedic surgery has been consulted for amputation, and infectious diseases been consulted regarding antibiotic choice and duration.   Consultants:   Vascular surgery  Infectious disease  Orthopedic surgery  Procedures:   None  Antimicrobials:   Aztreonam 5/24 >> 5/25  Vancomycin x1 on  5/24  Cefepime 5/25 >>   Subjective: Swelling in his legs is still severe, there is some weeping.  He has no pain in his legs with at rest, but he hurts when he lifts them up.  No confusion, fever, vomiting, flank pain, dyspnea.     Objective: Vitals:   12/09/17 0756 12/09/17 1259 12/09/17 2125 12/10/17 0538  BP: 111/66 102/68 100/74 (!) 98/45  Pulse: 98 95 (!) 105 (!) 101  Resp:  18  18  Temp:  98.2 F (36.8 C) 98.7 F (37.1 C) 98.5 F (36.9 C)  TempSrc:  Oral Oral Oral  SpO2:  100% 100% 100%  Weight:  94.6 kg (208 lb 8 oz)    Height:        Intake/Output Summary (Last 24 hours) at 12/10/2017 1156 Last data filed at 12/10/2017 0900 Gross per 24 hour  Intake 582 ml  Output 700 ml  Net -118 ml   Filed Weights   12/06/17 1922 12/08/17 0753 12/09/17 1259  Weight: 98.4 kg (217 lb) 91.1 kg (200 lb 12.8 oz) 94.6 kg (208 lb 8 oz)    Examination: General appearance: Adult male, sitting in chair, interactive, no acute distress HEENT: Anicteric, conjunctive are pink, lids and lashes normal.  No nasal deformity, discharge, or epistaxis.  Lips moist, poor dentition.  No oral lesions, oropharynx moist, hearing normal. Skin: Warm and dry without jaundice.  Chronic venous insufficiency changes of bilateral legs and brawny edema.  Dry gangrene of the left first second and third toes.  Mild weeping of bilateral legs. Cardiac: RRR, no murmurs, no carotid bruits, 2-3+ lower extremity edema. Respiratory: Normal respiratory effort, no rales wheezes. Abdomen: Abdomen soft, no tenderness to palpation, no hepatosplenomegaly, large groin hernia.  Scrotal edema noted, which is chronic. MSK: No deformities or effusions of the large joints of the arms or legs bilaterally.  Gangrene of the left toes as described above. Neuro: Sensorium intact responding to questions.  Moves all extremities with normal strength, speech fluent.    Psych: Attention and affect normal, judgment insight normal.    Data  Reviewed: I have personally reviewed following labs and imaging studies:  CBC: Recent Labs  Lab 12/06/17 1227 12/07/17 0550 12/08/17 0347 12/09/17 0507 12/10/17 0610  WBC 17.7* 14.9* 12.2* 10.7* 10.9*  NEUTROABS 15.7*  --   --   --   --   HGB 13.2 11.6* 12.1* 11.2* 11.4*  HCT 39.1 35.2* 36.3* 34.2* 35.0*  MCV 92.0 94.6 94.3 96.1 96.2  PLT 225 209 200 180 203   Basic Metabolic Panel: Recent Labs  Lab 12/06/17 1227 12/07/17 0550 12/08/17 0347 12/09/17 0507 12/10/17 0610  NA 135 135 137 137 139  K 4.3 4.2 4.6 4.6 4.4  CL 104 109 110 110 107  CO2 18* 17* 18* 20* 25  GLUCOSE 102* 96 102* 111* 110*  BUN 60* 51* 41* 28* 24*  CREATININE 1.73* 1.21 1.20 0.94 1.05  CALCIUM 9.0 8.7* 8.5* 8.5* 8.7*   GFR: Estimated Creatinine Clearance: 87.8 mL/min (by C-G formula based on SCr of 1.05 mg/dL). Liver Function Tests: Recent Labs  Lab 12/06/17 1227 12/07/17 0550  AST 25 22  ALT 21 18  ALKPHOS 70 63  BILITOT 0.6 0.6  PROT 7.8 7.1  ALBUMIN 2.8* 2.4*   No results for input(s): LIPASE, AMYLASE in the last 168 hours. No  results for input(s): AMMONIA in the last 168 hours. Coagulation Profile: Recent Labs  Lab 12/07/17 0550  INR 1.11   Cardiac Enzymes: No results for input(s): CKTOTAL, CKMB, CKMBINDEX, TROPONINI in the last 168 hours. BNP (last 3 results) No results for input(s): PROBNP in the last 8760 hours. HbA1C: No results for input(s): HGBA1C in the last 72 hours. CBG: No results for input(s): GLUCAP in the last 168 hours. Lipid Profile: No results for input(s): CHOL, HDL, LDLCALC, TRIG, CHOLHDL, LDLDIRECT in the last 72 hours. Thyroid Function Tests: No results for input(s): TSH, T4TOTAL, FREET4, T3FREE, THYROIDAB in the last 72 hours. Anemia Panel: No results for input(s): VITAMINB12, FOLATE, FERRITIN, TIBC, IRON, RETICCTPCT in the last 72 hours. Urine analysis:    Component Value Date/Time   COLORURINE YELLOW 12/06/2017 2100   APPEARANCEUR HAZY (A)  12/06/2017 2100   LABSPEC 1.017 12/06/2017 2100   PHURINE 5.0 12/06/2017 2100   GLUCOSEU NEGATIVE 12/06/2017 2100   HGBUR MODERATE (A) 12/06/2017 2100   BILIRUBINUR NEGATIVE 12/06/2017 2100   KETONESUR NEGATIVE 12/06/2017 2100   PROTEINUR NEGATIVE 12/06/2017 2100   NITRITE NEGATIVE 12/06/2017 2100   LEUKOCYTESUR NEGATIVE 12/06/2017 2100   Sepsis Labs: (procalcitonin:4,lacticacidven:4)  )No results found for this or any previous visit (from the past 240 hour(s)).                      Radiology Studies: No results found.      Scheduled Meds: . aspirin EC  81 mg Oral Daily  . atorvastatin  40 mg Oral Daily  . carvedilol  3.125 mg Oral BID WC  . collagenase   Topical Daily  . DULoxetine  30 mg Oral Daily  . enoxaparin (LOVENOX) injection  40 mg Subcutaneous Daily  . feeding supplement (ENSURE ENLIVE)  237 mL Oral BID BM  . furosemide  40 mg Intravenous Daily  . Gerhardt's butt cream   Topical TID  . hydrocerin   Topical BID  . levothyroxine  100 mcg Oral QAC breakfast  . multivitamin with minerals  1 tablet Oral Daily  . nutrition supplement (JUVEN)  1 packet Oral BID BM  . spironolactone  25 mg Oral Daily  . tamsulosin  0.4 mg Oral QPC breakfast   Continuous Infusions: . ceFEPime (MAXIPIME) IV Stopped (12/10/17 0907)     LOS: 4 days    Time spent: 25 minutes    Alberteen Sam, MD [facility] 12/10/2017, 9:00 AM     Pager [phone] --- please page though AMION:  [email or web address] If 7PM-7AM, please contact night-coverage

## 2017-12-10 NOTE — Progress Notes (Signed)
Pharmacy Antibiotic Note  Peter Cochran is a 62 y.o. male admitted on 12/06/2017 with wound infection.  Pharmacy has been consulted for zosyn dosing.  Has hx of PVD/PAD w/ chronic leg wounds that have developed gangrenous features as per ID notes. WBC 10.9. Scr 1.05 (CrCl 87 mL/min). Afebrile currently. Sed rate 78, CRP 13.5 on 5/25. Was receiving cefepime, now changing to zosyn for additional anaerobic coverage.  Plan: Zosyn 3.375g IV q8h (4 hour infusion).  Monitor renal function, clinical pic, cx results, and renal fx  Height:  (180.3 cm) Weight: 208 lb 8 oz (94.6 kg) IBW/kg (Calculated) : 75.3  Temp (24hrs), Avg:98.3 F (36.8 C), Min:98 F (36.7 C), Max:98.5 F (36.9 C)  Recent Labs  Lab 12/06/17 1227 12/06/17 1245 12/07/17 0550 12/08/17 0347 12/09/17 0507 12/10/17 0610  WBC 17.7*  --  14.9* 12.2* 10.7* 10.9*  CREATININE 1.73*  --  1.21 1.20 0.94 1.05  LATICACIDVEN  --  1.74  --   --   --   --     Estimated Creatinine Clearance: 87.8 mL/min (by C-G formula based on SCr of 1.05 mg/dL).    Allergies  Allergen Reactions  . Oxytetracycline Itching and Swelling    Facial swelling  . Sulfa Antibiotics Rash    Antimicrobials this admission: Zosyn 5/28 >>  Cefepime 5/25 >> 5/28 Clindamycin/vancomycin x1  Dose adjustments this admission: N/A  Microbiology results: None  Thank you for allowing pharmacy to be a part of this patient's care.  Girard Cooter, PharmD Clinical Pharmacist  Pager: [phone] Phone: [phone] 12/10/2017 9:44 PM

## 2017-12-10 NOTE — Consult Note (Signed)
Reason for Consult:Gangrene of toes Referring Physician: C Danford  Marquies Peter Cochran is an 62 y.o. male.  HPI: Peter Cochran has been struggling with foot wounds for months. He's been under the care of [facility] wound center. He's had the usual course of waxing and waning ulcerations. He denies pain with these unless his feet are not in a dependent position in which case it's severe. Recently the first two toes on his left foot turned black and he developed sepsis that required hospitalization. He came to [facility] on his own for further care.   Past Medical History:  Diagnosis Date  . Cellulitis    mild.  L leg  . CHF (congestive heart failure) (HCC)    Dr. Shirlee More  . 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  . Hand fracture, right    Dr. Joya Salm  . Hypothyroidism (acquired)   . Leg ulcer, left ([name])   . PAD (peripheral artery disease) ([place])   . Periorbital hematoma, right   . Peripheral neuropathy   . Toe ulcer ([place])    Left  . Wound of left leg    Non-healing. Dr. Meda Coffee, Dr. Cherlyn Labella.    History reviewed. No pertinent surgical history.  Family History  Problem Relation Age of Onset  . Hypertension Mother   . Hyperlipidemia Mother   . Heart attack Father   . Diabetes Father   . Hypertension Father   . Hyperlipidemia Father   . Stroke Maternal Grandmother     Social History:  reports that he has been smoking cigarettes.  He has been smoking about 2.00 packs per day. He has never used smokeless tobacco. He reports that he drank alcohol. He reports that he has current or past drug history.  Allergies:  Allergies  Allergen Reactions  . Oxytetracycline Itching and Swelling    Facial swelling  . Sulfa Antibiotics Rash    Medications: I have reviewed the patient's current medications.  Results for orders placed or performed during the hospital encounter of 12/06/17 (from the past 48 hour(s))  Basic metabolic panel     Status: Abnormal   Collection  Time: 12/09/17  5:07 AM  Result Value Ref Range   Sodium 137 135 - 145 mmol/L   Potassium 4.6 3.5 - 5.1 mmol/L   Chloride 110 101 - 111 mmol/L   CO2 20 (L) 22 - 32 mmol/L   Glucose, Bld 111 (H) 65 - 99 mg/dL   BUN 28 (H) 6 - 20 mg/dL   Creatinine, Ser 0.94 0.61 - 1.24 mg/dL   Calcium 8.5 (L) 8.9 - 10.3 mg/dL   GFR calc non Af Amer >60 >60 mL/min   GFR calc Af Amer >60 >60 mL/min    Comment: (NOTE) The eGFR has been calculated using the CKD EPI equation. This calculation has not been validated in all clinical situations. eGFR's persistently <60 mL/min signify possible Chronic Kidney Disease.    Anion gap 7 5 - 15    Comment: Performed at [facility] [address] [place] [address]  CBC     Status: Abnormal   Collection Time: 12/09/17  5:07 AM  Result Value Ref Range   WBC 10.7 (H) 4.0 - 10.5 K/uL   RBC 3.56 (L) 4.22 - 5.81 MIL/uL   Hemoglobin 11.2 (L) 13.0 - 17.0 g/dL   HCT 34.2 (L) 39.0 - 52.0 %   MCV 96.1 78.0 - 100.0 fL   MCH 31.5 26.0 -  34.0 pg   MCHC 32.7 30.0 - 36.0 g/dL   RDW 15.5 11.5 - 15.5 %   Platelets 180 150 - 400 K/uL    Comment: Performed at [facility], [place] [address], [place], [place] [address]  Basic metabolic panel     Status: Abnormal   Collection Time: 12/10/17  6:10 AM  Result Value Ref Range   Sodium 139 135 - 145 mmol/L   Potassium 4.4 3.5 - 5.1 mmol/L   Chloride 107 101 - 111 mmol/L   CO2 25 22 - 32 mmol/L   Glucose, Bld 110 (H) 65 - 99 mg/dL   BUN 24 (H) 6 - 20 mg/dL   Creatinine, Ser 1.05 0.61 - 1.24 mg/dL   Calcium 8.7 (L) 8.9 - 10.3 mg/dL   GFR calc non Af Amer >60 >60 mL/min   GFR calc Af Amer >60 >60 mL/min    Comment: (NOTE) The eGFR has been calculated using the CKD EPI equation. This calculation has not been validated in all clinical situations. eGFR's persistently <60 mL/min signify possible Chronic Kidney Disease.    Anion gap 7 5 - 15    Comment: Performed at [facility] [address]  [place], [place] [address]  CBC     Status: Abnormal   Collection Time: 12/10/17  6:10 AM  Result Value Ref Range   WBC 10.9 (H) 4.0 - 10.5 K/uL   RBC 3.64 (L) 4.22 - 5.81 MIL/uL   Hemoglobin 11.4 (L) 13.0 - 17.0 g/dL   HCT 35.0 (L) 39.0 - 52.0 %   MCV 96.2 78.0 - 100.0 fL   MCH 31.3 26.0 - 34.0 pg   MCHC 32.6 30.0 - 36.0 g/dL   RDW 15.7 (H) 11.5 - 15.5 %   Platelets 203 150 - 400 K/uL    Comment: Performed at [facility], [place] [address] [place], [place] [address]    No results found.  Review of Systems  Constitutional: Negative for weight loss.  HENT: Negative for ear discharge, ear pain, hearing loss and tinnitus.   Eyes: Negative for blurred vision, double vision, photophobia and pain.  Respiratory: Negative for cough, sputum production and shortness of breath.   Cardiovascular: Negative for chest pain.  Gastrointestinal: Negative for abdominal pain, nausea and vomiting.  Genitourinary: Negative for dysuria, flank pain, frequency and urgency.  Musculoskeletal: Positive for joint pain (BLE with elevation). Negative for back pain, falls, myalgias and neck pain.  Neurological: Negative for dizziness, tingling, sensory change, focal weakness, loss of consciousness and headaches.  Endo/Heme/Allergies: Does not bruise/bleed easily.  Psychiatric/Behavioral: Negative for depression, memory loss and substance abuse. The patient is not nervous/anxious.    Blood pressure (!) 98/45, pulse (!) 101, temperature 98.5 F (36.9 C), temperature source Oral, resp. rate 18, height '5\' 11"'  (1.803 m), weight 94.6 kg (208 lb 8 oz), SpO2 100 %. Physical Exam  Constitutional: He appears well-developed and well-nourished. No distress.  HENT:  Head: Normocephalic and atraumatic.  Eyes: Conjunctivae are normal. Right eye exhibits no discharge. Left eye exhibits no discharge. No scleral icterus.  Neck: Normal range of motion.  Cardiovascular: Normal rate and regular rhythm.  Respiratory: Effort  normal. No respiratory distress.  Musculoskeletal:  RLE No traumatic wounds, ecchymosis, or rash  Nontender  No knee effusion  Knee stable to varus/ valgus and anterior/posterior stress  Sens DPN, SPN, TN paresthetic  Motor EHL, ext, flex, evers 5/5  DP 0, PT 0, 4+ pitting edema  LLE No traumatic wounds, ecchymosis, or rash  Dressing in place  No knee or ankle effusion  Knee stable to varus/ valgus and anterior/posterior stress  Sens DPN, SPN, TN paresthetic  Motor EHL 5/5  4+ pitting edema, gangrene of 1st, 2nd toes  Neurological: He is alert.  Skin: Skin is warm and dry. He is not diaphoretic.  Psychiatric: He has a normal mood and affect. His behavior is normal.    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 Sharol Given to evaluate. Suspect amputation Friday once revascularization performed Thursday. MRI would be useful for planning purposes but patient cannot tolerate. No change to care regimen from orthopedic standpoint at this time.    Lisette Abu, PA-C Orthopedic Surgery [phone] 12/10/2017, 10:01 AM

## 2017-12-11 ENCOUNTER — Encounter (HOSPITAL_COMMUNITY): Payer: Self-pay | Admitting: General Practice

## 2017-12-11 ENCOUNTER — Other Ambulatory Visit: Payer: Self-pay

## 2017-12-11 DIAGNOSIS — L03116 Cellulitis of left lower limb: Secondary | ICD-10-CM

## 2017-12-11 DIAGNOSIS — I5021 Acute systolic (congestive) heart failure: Secondary | ICD-10-CM

## 2017-12-11 DIAGNOSIS — I96 Gangrene, not elsewhere classified: Secondary | ICD-10-CM

## 2017-12-11 DIAGNOSIS — L03115 Cellulitis of right lower limb: Secondary | ICD-10-CM

## 2017-12-11 DIAGNOSIS — L97922 Non-pressure chronic ulcer of unspecified part of left lower leg with fat layer exposed: Secondary | ICD-10-CM

## 2017-12-11 LAB — BASIC METABOLIC PANEL
ANION GAP: 10 (ref 5–15)
BUN: 27 mg/dL — ABNORMAL HIGH (ref 6–20)
CHLORIDE: 98 mmol/L — AB (ref 101–111)
CO2: 28 mmol/L (ref 22–32)
Calcium: 8.6 mg/dL — ABNORMAL LOW (ref 8.9–10.3)
Creatinine, Ser: 1.02 mg/dL (ref 0.61–1.24)
GFR calc Af Amer: >60 mL/min (ref >60)
GFR calc non Af Amer: >60 mL/min (ref >60)
GLUCOSE: 102 mg/dL — AB (ref 65–99)
POTASSIUM: 4.2 mmol/L (ref 3.5–5.1)
Sodium: 136 mmol/L (ref 135–145)

## 2017-12-11 LAB — CBC
HEMATOCRIT: 34.8 % — AB (ref 39.0–52.0)
HEMOGLOBIN: 11.4 g/dL — AB (ref 13.0–17.0)
MCH: 31.1 pg (ref 26.0–34.0)
MCHC: 32.8 g/dL (ref 30.0–36.0)
MCV: 95.1 fL (ref 78.0–100.0)
Platelets: 202 10*3/uL (ref 150–400)
RBC: 3.66 MIL/uL — ABNORMAL LOW (ref 4.22–5.81)
RDW: 15.3 % (ref 11.5–15.5)
WBC: 11.5 10*3/uL — ABNORMAL HIGH (ref 4.0–10.5)

## 2017-12-11 MED ORDER — FUROSEMIDE 10 MG/ML IJ SOLN
40.0000 mg | Freq: Every day | INTRAMUSCULAR | Status: DC
  Administered 2017-12-13 – 2017-12-15 (×3): 40 mg via INTRAVENOUS
  Filled 2017-12-11 (×3): qty 4

## 2017-12-11 NOTE — Progress Notes (Signed)
PROGRESS NOTE    Peter Cochran  ZOX:[id number] DOB: 21-Dec-1955 DOA: 12/06/2017 PCP: Floria Raveling, FNP   Brief Narrative:  62 year old with a history of CHF with ejection fraction 25%, peripheral vascular disease, hypothyroidism, hypertension, chronic venous and arterial ulcer comes to the hospital for evaluation of worsening of the left foot infection.  He was found to be septic secondary to left lower extremity infection.  He was transported here to [facility] for further evaluation by vascular surgery.  Patient was initially treated with vancomycin and aztreonam which was later switched.   Assessment & Plan:   Active Problems:   Sepsis due to cellulitis (HCC)   Pressure injury of skin   Left lower extremity cellulitis with gangrene of left 1,2, 3rd digit Peripheral vascular disease - Continue patient on vancomycin and Zosyn with plans to give total of 4 weeks of treatment.  Previously his lower extremity wound has grown Pseudomonas - Will likely need PICC line - Plans for angiography tomorrow by vascular surgery - Orthopedic recommends amputation of his left lower extremity toes - Appreciate infectious disease input. Cont Zosyn. May need PICC line if decide to treat him for 4 weeks.   Acute kidney injury -Creatinine appears to be stable around 1.0 at this time.  Will closely monitor monitor urine output  Chronic systolic congestive heart failure, ejection fraction 25% -Patient is euvolemic at this time.  Denies any signs of fluid overload or chest pain.  We will reduce his Lasix dose twice daily IV to daily. Resume Entresto when appropriate.   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  Foley is in place.  If does not do well with voiding trial here, he will need temporary  Foley with outpatient follow-up with urology.   DVT prophylaxis: Lovenox  Code Status: Full Code  Family Communication:  Wife at Bedside  Disposition Plan: Maintain Inpatient stay   Consultants:   Vascular   Ortho  ID  Procedures:   Plans for Angio tomorrow    Subjective: No complaints, he is in good spirits.   Review of Systems Otherwise negative except as per HPI, including: General: Denies fever, chills, night sweats or unintended weight loss. Resp: Denies cough, wheezing, shortness of breath. Cardiac: Denies chest pain, palpitations, orthopnea, paroxysmal nocturnal dyspnea. GI: Denies abdominal pain, nausea, vomiting, diarrhea or constipation GU: Denies dysuria, frequency, hesitancy or incontinence MS: Denies muscle aches, joint pain or swelling Neuro: Denies headache, neurologic deficits (focal weakness, numbness, tingling), abnormal gait Psych: Denies anxiety, depression, SI/HI/AVH Skin: Denies new rashes or lesions ID: Denies sick contacts, exotic exposures, travel  Objective: Vitals:   12/10/17 1426 12/10/17 1803 12/10/17 2203 12/11/17 0517  BP: (!) 92/58 91/64 105/71 98/67  Pulse: (!) 110 (!) 112 97 (!) 101  Resp: Temp: 98 F (36.7 C)  98 F (36.7 C) 98.4 F (36.9 C)  TempSrc: Oral  Oral Oral  SpO2: 100%  99% 100%  Weight:      Height:        Intake/Output Summary (Last 24 hours) at 12/11/2017 1132 Last data filed at 12/11/2017 0518 Gross per 24 hour  Intake 840 ml  Output 1675 ml  Net -835 ml   Filed Weights   12/06/17 1922 12/08/17 0753 12/09/17 1259  Weight: 98.4 kg (217 lb) 91.1 kg (200 lb 12.8 oz)  94.6 kg (208 lb 8 oz)    Examination:  General exam: Appears calm and comfortable  Respiratory system: Clear to auscultation. Respiratory effort normal. Cardiovascular system: S1 & S2 heard, RRR. No JVD, murmurs, rubs, gallops or clicks.  Bilateral lower extremity 2+ pitting edema Gastrointestinal system: Abdomen is nondistended,  soft and nontender. No organomegaly or masses felt. Normal bowel sounds heard. Central nervous system: Alert and oriented. No focal neurological deficits. Extremities: Symmetric 5 x 5 power. Skin: Bilateral lower extremity dressing is in place, gangrene of the left first and second toe noted.  There is also area of erythema bilateral lower extremity. Psychiatry: Judgement and insight appear normal. Mood & affect appropriate.     Data Reviewed:   CBC: Recent Labs  Lab 12/06/17 1227 12/07/17 0550 12/08/17 0347 12/09/17 0507 12/10/17 0610 12/11/17 0554  WBC 17.7* 14.9* 12.2* 10.7* 10.9* 11.5*  NEUTROABS 15.7*  --   --   --   --   --   HGB 13.2 11.6* 12.1* 11.2* 11.4* 11.4*  HCT 39.1 35.2* 36.3* 34.2* 35.0* 34.8*  MCV 92.0 94.6 94.3 96.1 96.2 95.1  PLT 225 209 200 180 203 202   Basic Metabolic Panel: Recent Labs  Lab 12/07/17 0550 12/08/17 0347 12/09/17 0507 12/10/17 0610 12/11/17 0554  NA 135 137 137 139 136  K 4.2 4.6 4.6 4.4 4.2  CL 109 110 110 107 98*  CO2 17* 18* 20* 25 28  GLUCOSE 96 102* 111* 110* 102*  BUN 51* 41* 28* 24* 27*  CREATININE 1.21 1.20 0.94 1.05 1.02  CALCIUM 8.7* 8.5* 8.5* 8.7* 8.6*   GFR: Estimated Creatinine Clearance: 90.4 mL/min (by C-G formula based on SCr of 1.02 mg/dL). Liver Function Tests: Recent Labs  Lab 12/06/17 1227 12/07/17 0550  AST 25 22  ALT 21 18  ALKPHOS 70 63  BILITOT 0.6 0.6  PROT 7.8 7.1  ALBUMIN 2.8* 2.4*   No results for input(s): LIPASE, AMYLASE in the last 168 hours. No results for input(s): AMMONIA in the last 168 hours. Coagulation Profile: Recent Labs  Lab 12/07/17 0550  INR 1.11   Cardiac Enzymes: No results for input(s): CKTOTAL, CKMB, CKMBINDEX, TROPONINI in the last 168 hours. BNP (last 3 results) No results for input(s): PROBNP in the last 8760 hours. HbA1C: No results for input(s): HGBA1C in the last 72 hours. CBG: No results for input(s): GLUCAP in the last 168 hours. Lipid Profile: No  results for input(s): CHOL, HDL, LDLCALC, TRIG, CHOLHDL, LDLDIRECT in the last 72 hours. Thyroid Function Tests: No results for input(s): TSH, T4TOTAL, FREET4, T3FREE, THYROIDAB in the last 72 hours. Anemia Panel: No results for input(s): VITAMINB12, FOLATE, FERRITIN, TIBC, IRON, RETICCTPCT in the last 72 hours. Sepsis Labs: Recent Labs  Lab 12/06/17 1245  LATICACIDVEN 1.74    No results found for this or any previous visit (from the past 240 hour(s)).       Radiology Studies: No results found.      Scheduled Meds: . aspirin EC  81 mg Oral Daily  . atorvastatin  40 mg Oral Daily  . carvedilol  3.125 mg Oral BID WC  . collagenase   Topical Daily  . DULoxetine  30 mg Oral Daily  . enoxaparin (LOVENOX) injection  40 mg Subcutaneous Daily  . feeding supplement (ENSURE ENLIVE)  237 mL Oral BID BM  . furosemide  40 mg Intravenous BID  . Gerhardt's butt cream   Topical TID  . hydrocerin   Topical BID  .  levothyroxine  100 mcg Oral QAC breakfast  . multivitamin with minerals  1 tablet Oral Daily  . nutrition supplement (JUVEN)  1 packet Oral BID BM  . spironolactone  25 mg Oral Daily  . tamsulosin  0.4 mg Oral QPC breakfast   Continuous Infusions: . piperacillin-tazobactam (ZOSYN)  IV 3.375 g (12/11/17 0913)     LOS: 5 days    I have spent 25 minutes face to face with the patient and on the ward discussing the patients care, assessment, plan and disposition with other care givers. >50% of the time was devoted counseling the patient about the risks and benefits of treatment and coordinating care.     Itzell Bendavid Joline Maxcy, MD [facility] Pager [phone]   If 7PM-7AM, please contact night-coverage [email or web address] Password [facility] 12/11/2017, 11:32 AM

## 2017-12-11 NOTE — Progress Notes (Signed)
Nutrition Follow-up  DOCUMENTATION CODES:   Non-severe (moderate) malnutrition in context of chronic illness  INTERVENTION:   -D/c Juven due to poor acceptance -D/c Magic Cup -Continue MVI daily -Continue Ensure Enlive po BID, each supplement provides 350 kcal and 20 grams of protein  NUTRITION DIAGNOSIS:   Moderate Malnutrition related to chronic illness(PVD) as evidenced by energy intake < 75% for > or equal to 1 month, mild fat depletion, moderate fat depletion, mild muscle depletion, moderate muscle depletion.  Ongoing  GOAL:   Patient will meet greater than or equal to 90% of their needs  Progressing  MONITOR:   PO intake, Supplement acceptance, Labs, Weight trends, Skin, I & O's  REASON FOR ASSESSMENT:   Consult Wound healing  ASSESSMENT:   62 y.o. M with CHF EF 25%, PVD, chronic venous and arterial ulcers, hypothyroidism and hypertension who presented to OSH 1 weeks ago with syncope, found to have sepsis from left leg wound infection, treated and stabilized on vancomycin and aztreonam, developed AKI, was referred for vascular surgery evaluation and [facility], refused transport and drove himself to [facility].  Spoke with pt, who was sitting in recliner chair at time of visit. He is in good spirits; he shares that he is scheduled for revascularization procedure tomorrow with potential amputation on Friday (05/15/18); orthopedics to evaluate.   Pt reports he has had leg and toe wounds "for years" which were managed by his PCP. He was not on any vitamins PTA. Of note, he shares he was consuming 1-2 Ensure Max Protein supplements daily, however "they were nasty".   Pt reports fair appetite; he is consuming most of his meals now (consumed all of his grilled chicken sandwich and most of baked potato for lunch). Noted meal completion 25-100%. He shares that he was having decreased appetite related to early satiety for 3 weeks PTA- he generally consumes 3 meals per day, however, was  consuming about 50% less of what he normally eats.   Pt reports UBW of around 208#. He denies any weight loss, "other than all the fluid". Noted 11.4% wt loss over the past 6 weeks, however, wt hx difficult to assess related to diuresis.   Pt really enjoys the Ensure Enlive supplements and has been consuming them. He tried the [place], but has been refusing ("that stuff made me sick"). Discussed importance of good nutritional intake (meals and supplements) to support healing.   Labs reviewed.   NUTRITION - FOCUSED PHYSICAL EXAM:    Most Recent Value  Orbital Region  Mild depletion  Upper Arm Region  Moderate depletion  Thoracic and Lumbar Region  No depletion  Buccal Region  No depletion  Temple Region  Moderate depletion  Clavicle Bone Region  Moderate depletion  Clavicle and Acromion Bone Region  Moderate depletion  Scapular Bone Region  Moderate depletion  Dorsal Hand  Mild depletion  Patellar Region  No depletion  Anterior Thigh Region  No depletion  Posterior Calf Region  No depletion  Edema (RD Assessment)  Moderate  Hair  Reviewed  Eyes  Reviewed  Mouth  Reviewed  Skin  Reviewed  Nails  Reviewed       Diet Order:   Diet Order           Diet NPO time specified Except for: Sips with Meds  Diet effective midnight        Diet Heart Room service appropriate? Yes; Fluid consistency: Thin  Diet effective now  EDUCATION NEEDS:   Education needs have been addressed  Skin:  Skin Assessment: Skin Integrity Issues: Skin Integrity Issues:: Stage IV, Other (Comment) Stage IV: coccyx Other: bilateral legs venous stasis ulcers  Last BM:  12/08/17  Height:   Ht Readings from Last 1 Encounters:  12/06/17  (1.803 m)    Weight:   Wt Readings from Last 1 Encounters:  12/09/17 208 lb 8 oz (94.6 kg)    Ideal Body Weight:  78.18 kg  BMI:  Body mass index is 29.08 kg/m.  Estimated Nutritional Needs:   Kcal:  9147-8295   Protein:  125-135  grams  Fluid:  >/= 2.3 L/day    Asaiah Hunnicutt A. Mayford Knife, RD, LDN, CDE Pager: [phone] After hours Pager: [phone]

## 2017-12-11 NOTE — Consult Note (Signed)
ORTHOPAEDIC CONSULTATION  REQUESTING PHYSICIAN: Peter Nanas, MD  Chief Complaint: Painful gangrenous ischemia bilateral lower extremities worse on the left than the right.  HPI: Peter Cochran is a 62 y.o. 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  Patient states that he was been going to wound care in [place] with dressing changes including alginate and other wound care products without resolution of his ischemic gangrenous changes to the left foot.  Patient states that elevation is painful and causes burning he states he has to sit and sleep with his legs dependent.  Past Medical History:  Diagnosis Date  . Cellulitis    mild.  L leg  . CHF (congestive heart failure) (HCC)    Dr. Norman Herrlich  . 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  . Hand fracture, right    Dr. Linda Hedges  . Hypothyroidism (acquired)   . Leg ulcer, left (HCC)   . PAD (peripheral artery disease) (HCC)   . Periorbital hematoma, right   . Peripheral neuropathy   . Toe ulcer (HCC)    Left  . Wound of left leg    Non-healing. Dr. Liston Alba, Dr. Bynum Bellows.   Past Surgical History:  Procedure Laterality Date  . CYST EXCISION     Social History   Socioeconomic History  . Marital status: Married    Spouse name: Not on file  . Number of children: Not on file  . Years of education: Not on file  . Highest education level: Not on file  Occupational History  . Not on file  Social Needs  . Financial resource strain: Not on file  . Food insecurity:    Worry: Not on file    Inability: Not on file  . Transportation needs:    Medical: Not on file    Non-medical: Not on file  Tobacco Use  . Smoking status: Current Every Day Smoker    Packs/day: 2.00    Years: 50.00    Pack years: 100.00    Types: Cigarettes  . Smokeless tobacco: Never Used  Substance and Sexual Activity  . Alcohol use: Not Currently   . Drug use: Not Currently  . Sexual activity: Not on file  Lifestyle  . Physical activity:    Days per week: Not on file    Minutes per session: Not on file  . Stress: Not on file  Relationships  . Social connections:    Talks on phone: Not on file    Gets together: Not on file    Attends religious service: Not on file    Active member of club or organization: Not on file    Attends meetings of clubs or organizations: Not on file    Relationship status: Not on file  Other Topics Concern  . Not on file  Social History Narrative  . Not on file   Family History  Problem Relation Age of Onset  . Hypertension Mother   . Hyperlipidemia Mother   . Heart attack Father   . Diabetes Father   . Hypertension Father   . Hyperlipidemia Father   . Stroke Maternal Grandmother    - negative except otherwise stated in the family history section Allergies  Allergen Reactions  . Oxytetracycline Itching and Swelling    Facial swelling  . Sulfa Antibiotics Rash   Prior to Admission medications   Medication Sig Start Date End  Date Taking? Authorizing Provider  aspirin EC 81 MG tablet Take 81 mg by mouth daily.   Yes [provider]  atorvastatin (LIPITOR) 40 MG tablet Take 1 tablet (40 mg total) by mouth daily. 11/20/17  Yes Baldo Daub, MD  carvedilol (COREG) 3.125 MG tablet Take 1 tablet (3.125 mg total) by mouth 2 (two) times daily with a meal. 11/20/17  Yes Baldo Daub, MD  DULoxetine (CYMBALTA) 30 MG capsule Take 30 mg by mouth daily. 10/23/17  Yes [provider]  furosemide (LASIX) 20 MG tablet Take 1 tablet (20 mg total) by mouth daily. 10/28/17 01/26/18 Yes Baldo Daub, MD  HYDROcodone-acetaminophen (NORCO/VICODIN) 5-325 MG tablet Take 1 tablet by mouth every 4 (four) hours as needed for pain. 10/30/17  Yes [provider]  levothyroxine (SYNTHROID, LEVOTHROID) 100 MCG tablet Take 100 mcg by mouth daily before breakfast.    Yes [provider]    sacubitril-valsartan (ENTRESTO) 24-26 MG Take 1 tablet by mouth 3 (three) times a week. 11/13/17  Yes Baldo Daub, MD  spironolactone (ALDACTONE) 25 MG tablet Take 1 tablet (25 mg total) by mouth daily. 10/28/17  Yes Baldo Daub, MD  Vitamin D, Ergocalciferol, (DRISDOL) 50000 units CAPS capsule Take 50,000 Units by mouth every 7 (seven) days.   Yes [provider]   No results found. - pertinent xrays, CT, MRI studies were reviewed and independently interpreted  Positive ROS: All other systems have been reviewed and were otherwise negative with the exception of those mentioned in the HPI and as above.  Physical Exam: General: Alert, no acute distress Psychiatric: Patient is competent for consent with normal mood and affect Lymphatic: No axillary or cervical lymphadenopathy Cardiovascular: No pedal edema Respiratory: No cyanosis, no use of accessory musculature GI: No organomegaly, abdomen is soft and non-tender    Images:  @  Labs:  Lab Results  Component Value Date   HGBA1C 6.0 (H) 12/07/2017   ESRSEDRATE 78 (H) 12/07/2017   CRP 13.5 (H) 12/07/2017    Lab Results  Component Value Date   ALBUMIN 2.4 (L) 12/07/2017   ALBUMIN 2.8 (L) 12/06/2017   PREALBUMIN 10.8 (L) 12/07/2017    Neurologic: Patient does not have protective sensation bilateral lower extremities.   MUSCULOSKELETAL:   Skin: Examination patient has significant venous stasis swelling of both lower extremities with brawny skin color changes.  There is black gangrenous changes to the toes on the left foot with venous ulcers on the left lower extremity.  Right foot has no gangrenous ulcers.  Patient does not have palpable pulses.  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  Plan: Patient is scheduled for endovascular evaluation of both lower extremities on Thursday.  I will follow-up if  needed pending the results of the revascularization work.  If patient has good revascularization, a transmetatarsal amputation on the left is an option.  Thank you for the consult and the opportunity to see Mr. Peter Dicker, MD [facility] [phone] 4:56 PM

## 2017-12-11 NOTE — Progress Notes (Deleted)
Cardiology Office Note:    Date:  12/11/2017   ID:  Peter Cochran, DOB 1956-03-13, MRN [id number]  PCP:  Floria Raveling, FNP  Cardiologist:  Norman Herrlich, MD    Referring MD: Floria Raveling, FNP    ASSESSMENT:    No diagnosis found. PLAN:    In order of problems listed above:  1. ***   Next appointment: ***   Medication Adjustments/Labs and Tests Ordered: Current medicines are reviewed at length with the patient today.  Concerns regarding medicines are outlined above.  No orders of the defined types were placed in this encounter.  No orders of the defined types were placed in this encounter.   No chief complaint on file.   History of Present Illness:    Peter Cochran is a 62 y.o. male with a hx of *** last seen ***. Compliance with diet, lifestyle and medications: *** Past Medical History:  Diagnosis Date  . Cellulitis    mild.  L leg  . CHF (congestive heart failure) (HCC)    Dr. Norman Herrlich  . 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  . Hand fracture, right    Dr. Linda Hedges  . Hypothyroidism (acquired)   . Leg ulcer, left (HCC)   . PAD (peripheral artery disease) (HCC)   . Periorbital hematoma, right   . Peripheral neuropathy   . Toe ulcer (HCC)    Left  . Wound of left leg    Non-healing. Dr. Liston Alba, Dr. Bynum Bellows.    No past surgical history on file.  Current Medications: No outpatient medications have been marked as taking for the 12/13/17 encounter (Appointment) with Baldo Daub, MD.     Allergies:   Oxytetracycline and Sulfa antibiotics   Social History   Socioeconomic History  . Marital status: Married    Spouse name: Not on file  . Number of children: Not on file  . Years of education: Not on file  . Highest education level: Not on file  Occupational History  . Not on file  Social Needs  . Financial resource strain: Not on file  . Food insecurity:    Worry: Not on file    Inability: Not on file  .  Transportation needs:    Medical: Not on file    Non-medical: Not on file  Tobacco Use  . Smoking status: Current Every Day Smoker    Packs/day: 2.00    Types: Cigarettes  . Smokeless tobacco: Never Used  Substance and Sexual Activity  . Alcohol use: Not Currently  . Drug use: Not Currently  . Sexual activity: Not on file  Lifestyle  . Physical activity:    Days per week: Not on file    Minutes per session: Not on file  . Stress: Not on file  Relationships  . Social connections:    Talks on phone: Not on file    Gets together: Not on file    Attends religious service: Not on file    Active member of club or organization: Not on file    Attends meetings of clubs or organizations: Not on file    Relationship status: Not on file  Other Topics Concern  . Not on file  Social History Narrative  . Not on file     Family History: The patient's ***family history includes Diabetes in his father; Heart attack in his father; Hyperlipidemia in his father and mother; Hypertension in his father and mother; Stroke  in his maternal grandmother. ROS:   Please see the history of present illness.    All other systems reviewed and are negative.  EKGs/Labs/Other Studies Reviewed:    The following studies were reviewed today:  EKG:  EKG ordered today.  The ekg ordered today demonstrates ***  Recent Labs: 12/07/2017: ALT 18 12/08/2017: B Natriuretic Peptide 321.2 12/11/2017: BUN 27; Creatinine, Ser 1.02; Hemoglobin 11.4; Platelets 202; Potassium 4.2; Sodium 136  Recent Lipid Panel No results found for: CHOL, TRIG, HDL, CHOLHDL, VLDL, LDLCALC, LDLDIRECT  Physical Exam:    VS:  There were no vitals taken for this visit.    Wt Readings from Last 3 Encounters:  12/09/17 208 lb 8 oz (94.6 kg)  10/28/17 235 lb (106.6 kg)     GEN: *** Well nourished, well developed in no acute distress HEENT: Normal NECK: No JVD; No carotid bruits LYMPHATICS: No lymphadenopathy CARDIAC: ***RRR, no  murmurs, rubs, gallops RESPIRATORY:  Clear to auscultation without rales, wheezing or rhonchi  ABDOMEN: Soft, non-tender, non-distended MUSCULOSKELETAL:  No edema; No deformity  SKIN: Warm and dry NEUROLOGIC:  Alert and oriented x 3 PSYCHIATRIC:  Normal affect    Signed, Norman Herrlich, MD  12/11/2017 1:24 PM    [facility]

## 2017-12-12 ENCOUNTER — Encounter (HOSPITAL_COMMUNITY)
Admission: EM | Disposition: A | Discharge status: Home Health | Payer: Self-pay | Source: Home / Self Care | Attending: Family Medicine

## 2017-12-12 ENCOUNTER — Inpatient Hospital Stay (HOSPITAL_COMMUNITY): Payer: PRIVATE HEALTH INSURANCE | Admitting: Anesthesiology

## 2017-12-12 ENCOUNTER — Encounter (HOSPITAL_COMMUNITY): Payer: Self-pay | Admitting: Certified Registered Nurse Anesthetist

## 2017-12-12 DIAGNOSIS — E43 Unspecified severe protein-calorie malnutrition: Secondary | ICD-10-CM

## 2017-12-12 DIAGNOSIS — I70262 Atherosclerosis of native arteries of extremities with gangrene, left leg: Secondary | ICD-10-CM

## 2017-12-12 HISTORY — PX: PERIPHERAL VASCULAR BALLOON ANGIOPLASTY: CATH118281

## 2017-12-12 HISTORY — PX: AORTOGRAM: SHX6300

## 2017-12-12 HISTORY — DX: Unspecified severe protein-calorie malnutrition: E43

## 2017-12-12 LAB — BASIC METABOLIC PANEL
ANION GAP: 6 (ref 5–15)
BUN: 28 mg/dL — ABNORMAL HIGH (ref 6–20)
CALCIUM: 8.6 mg/dL — AB (ref 8.9–10.3)
CO2: 28 mmol/L (ref 22–32)
CREATININE: 0.98 mg/dL (ref 0.61–1.24)
Chloride: 100 mmol/L — ABNORMAL LOW (ref 101–111)
GFR calc Af Amer: >60 mL/min (ref >60)
GFR calc non Af Amer: >60 mL/min (ref >60)
Glucose, Bld: 99 mg/dL (ref 65–99)
Potassium: 3.9 mmol/L (ref 3.5–5.1)
Sodium: 134 mmol/L — ABNORMAL LOW (ref 135–145)

## 2017-12-12 LAB — MAGNESIUM: Magnesium: 1.6 mg/dL — ABNORMAL LOW (ref 1.7–2.4)

## 2017-12-12 LAB — SURGICAL PCR SCREEN
MRSA, PCR: NEGATIVE
STAPHYLOCOCCUS AUREUS: NEGATIVE

## 2017-12-12 LAB — CBC
HCT: 36.4 % — ABNORMAL LOW (ref 39.0–52.0)
HEMOGLOBIN: 12.1 g/dL — AB (ref 13.0–17.0)
MCH: 31.5 pg (ref 26.0–34.0)
MCHC: 33.2 g/dL (ref 30.0–36.0)
MCV: 94.8 fL (ref 78.0–100.0)
Platelets: 211 10*3/uL (ref 150–400)
RBC: 3.84 MIL/uL — ABNORMAL LOW (ref 4.22–5.81)
RDW: 15.3 % (ref 11.5–15.5)
WBC: 12.1 10*3/uL — ABNORMAL HIGH (ref 4.0–10.5)

## 2017-12-12 LAB — PROTIME-INR
INR: 1.01
PROTHROMBIN TIME: 13.2 s (ref 11.4–15.2)

## 2017-12-12 LAB — POCT ACTIVATED CLOTTING TIME: ACTIVATED CLOTTING TIME: 136 s

## 2017-12-12 SURGERY — AORTOGRAM
Anesthesia: General | Laterality: Left

## 2017-12-12 MED ORDER — MIDAZOLAM HCL 2 MG/2ML IJ SOLN
INTRAMUSCULAR | Status: AC
  Filled 2017-12-12: qty 2

## 2017-12-12 MED ORDER — HYDRALAZINE HCL 20 MG/ML IJ SOLN: 5.0000 mg | INTRAMUSCULAR | Status: DC | PRN

## 2017-12-12 MED ORDER — OXYCODONE HCL 5 MG PO TABS: 5.0000 mg | ORAL_TABLET | Freq: Once | ORAL | Status: DC | PRN

## 2017-12-12 MED ORDER — HEPARIN SODIUM (PORCINE) 1000 UNIT/ML IJ SOLN
INTRAMUSCULAR | Status: DC | PRN
  Administered 2017-12-12: 10000 [IU] via INTRAVENOUS

## 2017-12-12 MED ORDER — LIDOCAINE HCL (CARDIAC) PF 100 MG/5ML IV SOSY
PREFILLED_SYRINGE | INTRAVENOUS | Status: DC | PRN
  Administered 2017-12-12: 80 mg via INTRAVENOUS

## 2017-12-12 MED ORDER — LACTATED RINGERS IV SOLN
INTRAVENOUS | Status: DC
  Administered 2017-12-12: 15:00:00 via INTRAVENOUS

## 2017-12-12 MED ORDER — 0.9 % SODIUM CHLORIDE (POUR BTL) OPTIME
TOPICAL | Status: DC | PRN
  Administered 2017-12-12: 1000 mL

## 2017-12-12 MED ORDER — DEXAMETHASONE SODIUM PHOSPHATE 10 MG/ML IJ SOLN
INTRAMUSCULAR | Status: AC
  Filled 2017-12-12: qty 1

## 2017-12-12 MED ORDER — LIDOCAINE HCL (PF) 1 % IJ SOLN
INTRAMUSCULAR | Status: AC
  Filled 2017-12-12: qty 30

## 2017-12-12 MED ORDER — PROPOFOL 10 MG/ML IV BOLUS
INTRAVENOUS | Status: AC
  Filled 2017-12-12: qty 20

## 2017-12-12 MED ORDER — OXYCODONE HCL 5 MG/5ML PO SOLN: 5.0000 mg | Freq: Once | ORAL | Status: DC | PRN

## 2017-12-12 MED ORDER — DEXTROSE 5 % IV SOLN
INTRAVENOUS | Status: DC | PRN
  Administered 2017-12-12: 50 ug/min via INTRAVENOUS

## 2017-12-12 MED ORDER — EPHEDRINE SULFATE 50 MG/ML IJ SOLN
INTRAMUSCULAR | Status: AC
  Filled 2017-12-12: qty 1

## 2017-12-12 MED ORDER — ONDANSETRON HCL 4 MG/2ML IJ SOLN
INTRAMUSCULAR | Status: AC
  Filled 2017-12-12: qty 2

## 2017-12-12 MED ORDER — PROTAMINE SULFATE 10 MG/ML IV SOLN
INTRAVENOUS | Status: DC | PRN
  Administered 2017-12-12: 60 mg via INTRAVENOUS

## 2017-12-12 MED ORDER — PROMETHAZINE HCL 25 MG/ML IJ SOLN: 6.2500 mg | INTRAMUSCULAR | Status: DC | PRN

## 2017-12-12 MED ORDER — SODIUM CHLORIDE 0.9 % IV SOLN
INTRAVENOUS | Status: DC | PRN
  Administered 2017-12-12: 16:00:00

## 2017-12-12 MED ORDER — SUGAMMADEX SODIUM 200 MG/2ML IV SOLN
INTRAVENOUS | Status: AC
  Filled 2017-12-12: qty 2

## 2017-12-12 MED ORDER — EPHEDRINE SULFATE 50 MG/ML IJ SOLN
INTRAMUSCULAR | Status: DC | PRN
  Administered 2017-12-12 (×2): 10 mg via INTRAVENOUS

## 2017-12-12 MED ORDER — FENTANYL CITRATE (PF) 100 MCG/2ML IJ SOLN
INTRAMUSCULAR | Status: DC | PRN
  Administered 2017-12-12 (×3): 50 ug via INTRAVENOUS

## 2017-12-12 MED ORDER — SODIUM CHLORIDE 0.9 % IJ SOLN
INTRAMUSCULAR | Status: AC
  Filled 2017-12-12: qty 10

## 2017-12-12 MED ORDER — OXYCODONE HCL 5 MG PO TABS
5.0000 mg | ORAL_TABLET | ORAL | Status: DC | PRN
  Administered 2017-12-13 – 2017-12-15 (×8): 10 mg via ORAL
  Filled 2017-12-12 (×8): qty 2

## 2017-12-12 MED ORDER — CLOPIDOGREL BISULFATE 75 MG PO TABS
75.0000 mg | ORAL_TABLET | Freq: Every day | ORAL | Status: DC
  Administered 2017-12-13 – 2017-12-15 (×3): 75 mg via ORAL
  Filled 2017-12-12 (×3): qty 1

## 2017-12-12 MED ORDER — SODIUM CHLORIDE 0.9% FLUSH: 3.0000 mL | INTRAVENOUS | Status: DC | PRN

## 2017-12-12 MED ORDER — SODIUM CHLORIDE 0.9 % IV SOLN
INTRAVENOUS | Status: AC
  Filled 2017-12-12: qty 1.2

## 2017-12-12 MED ORDER — FENTANYL CITRATE (PF) 100 MCG/2ML IJ SOLN: 25.0000 ug | INTRAMUSCULAR | Status: DC | PRN

## 2017-12-12 MED ORDER — ONDANSETRON HCL 4 MG/2ML IJ SOLN
INTRAMUSCULAR | Status: DC | PRN
  Administered 2017-12-12: 4 mg via INTRAVENOUS

## 2017-12-12 MED ORDER — FENTANYL CITRATE (PF) 250 MCG/5ML IJ SOLN
INTRAMUSCULAR | Status: AC
  Filled 2017-12-12: qty 5

## 2017-12-12 MED ORDER — SODIUM CHLORIDE 0.9 % WEIGHT BASED INFUSION: 1.0000 mL/kg/h | INTRAVENOUS | Status: AC

## 2017-12-12 MED ORDER — PROPOFOL 10 MG/ML IV BOLUS
INTRAVENOUS | Status: DC | PRN
  Administered 2017-12-12: 70 mg via INTRAVENOUS

## 2017-12-12 MED ORDER — PHENYLEPHRINE HCL 10 MG/ML IJ SOLN
INTRAMUSCULAR | Status: DC | PRN
  Administered 2017-12-12: 200 ug via INTRAVENOUS
  Administered 2017-12-12: 120 ug via INTRAVENOUS
  Administered 2017-12-12: 160 ug via INTRAVENOUS

## 2017-12-12 MED ORDER — CLOPIDOGREL BISULFATE 75 MG PO TABS
300.0000 mg | ORAL_TABLET | Freq: Once | ORAL | Status: AC
  Administered 2017-12-12: 300 mg via ORAL
  Filled 2017-12-12: qty 4

## 2017-12-12 MED ORDER — SODIUM CHLORIDE 0.9% FLUSH
3.0000 mL | Freq: Two times a day (BID) | INTRAVENOUS | Status: DC
  Administered 2017-12-13 – 2017-12-14 (×3): 3 mL via INTRAVENOUS

## 2017-12-12 MED ORDER — SODIUM CHLORIDE 0.9 % IJ SOLN
INTRAVENOUS | Status: DC | PRN
  Administered 2017-12-12 (×2): via INTRAMUSCULAR

## 2017-12-12 MED ORDER — PROTAMINE SULFATE 10 MG/ML IV SOLN
INTRAVENOUS | Status: AC
  Filled 2017-12-12: qty 10

## 2017-12-12 MED ORDER — SODIUM CHLORIDE 0.9 % IV SOLN: 250.0000 mL | INTRAVENOUS | Status: DC | PRN

## 2017-12-12 MED ORDER — MIDAZOLAM HCL 5 MG/5ML IJ SOLN
INTRAMUSCULAR | Status: DC | PRN
  Administered 2017-12-12: 2 mg via INTRAVENOUS

## 2017-12-12 MED ORDER — LABETALOL HCL 5 MG/ML IV SOLN: 10.0000 mg | INTRAVENOUS | Status: DC | PRN

## 2017-12-12 SURGICAL SUPPLY — 57 items
BAG BANDED W/RUBBER/TAPE 36X54 (MISCELLANEOUS) ×2 IMPLANT
BAG SNAP BAND KOVER 36X36 (MISCELLANEOUS) ×1 IMPLANT
BALLN MUSTANG 5X100X135 (BALLOONS) ×2
BALLOON MUSTANG 5X100X135 (BALLOONS) IMPLANT
BLADE SURG 11 STRL SS (BLADE) ×2 IMPLANT
BLADE SURG 15 STRL LF DISP TIS (BLADE) ×1 IMPLANT
BLADE SURG 15 STRL SS (BLADE) ×1
CANISTER SUCT 3000ML PPV (MISCELLANEOUS) ×2 IMPLANT
CATH ANGIO 5F BER 65CM (CATHETERS) IMPLANT
CATH ANGIO 5F BER2 65CM (CATHETERS) ×1 IMPLANT
CATH OMNI FLUSH .035X70CM (CATHETERS) ×3 IMPLANT
CATH QUICKCROSS .035X135CM (MICROCATHETER) ×1 IMPLANT
CATH TEMPO 5F RIM 65CM (CATHETERS) ×1 IMPLANT
COVER BACK TABLE 80X110 HD (DRAPES) ×5 IMPLANT
COVER DOME SNAP 22 D (MISCELLANEOUS) ×3 IMPLANT
COVER PROBE W GEL 5X96 (DRAPES) ×2 IMPLANT
COVER SURGICAL LIGHT HANDLE (MISCELLANEOUS) ×2 IMPLANT
DEVICE TORQUE KENDALL .025-038 (MISCELLANEOUS) ×1 IMPLANT
DRAPE FEMORAL ANGIO 80X135IN (DRAPES) ×4 IMPLANT
DRSG TEGADERM 2-3/8X2-3/4 SM (GAUZE/BANDAGES/DRESSINGS) ×1 IMPLANT
ELECT REM PT RETURN 9FT ADLT (ELECTROSURGICAL)
ELECTRODE REM PT RTRN 9FT ADLT (ELECTROSURGICAL) IMPLANT
GAUZE SPONGE 2X2 8PLY STRL LF (GAUZE/BANDAGES/DRESSINGS) IMPLANT
GAUZE SPONGE 4X4 16PLY XRAY LF (GAUZE/BANDAGES/DRESSINGS) ×3 IMPLANT
GLOVE BIO SURGEON STRL SZ7 (GLOVE) ×2 IMPLANT
GUIDEWIRE ANGLED .035X150CM (WIRE) ×1 IMPLANT
GUIDEWIRE ANGLED .035X260CM (WIRE) ×1 IMPLANT
KIT BASIN OR (CUSTOM PROCEDURE TRAY) ×2 IMPLANT
KIT ENCORE 26 ADVANTAGE (KITS) ×1 IMPLANT
KIT TURNOVER KIT B (KITS) ×2 IMPLANT
NDL PERC 18GX7CM (NEEDLE) ×1 IMPLANT
NEEDLE PERC 18GX7CM (NEEDLE) ×2 IMPLANT
NS IRRIG 1000ML POUR BTL (IV SOLUTION) ×2 IMPLANT
PACK PERIPHERAL VASCULAR (CUSTOM PROCEDURE TRAY) IMPLANT
PACK SURGICAL SETUP 50X90 (CUSTOM PROCEDURE TRAY) ×1 IMPLANT
PAD ARMBOARD 7.5X6 YLW CONV (MISCELLANEOUS) ×4 IMPLANT
PROTECTION STATION PRESSURIZED (MISCELLANEOUS) ×4
SET MICROPUNCTURE 5F STIFF (MISCELLANEOUS) ×1 IMPLANT
SHEATH AVANTI 11CM 5FR (SHEATH) ×1 IMPLANT
SHEATH PINNACLE ST 6F 45CM (SHEATH) ×1 IMPLANT
SPONGE GAUZE 2X2 STER 10/PKG (GAUZE/BANDAGES/DRESSINGS) ×1
STATION PROTECTION PRESSURIZED (MISCELLANEOUS) ×1 IMPLANT
STENT INNOVA 5X100X130 (Permanent Stent) ×1 IMPLANT
STOPCOCK MORSE 400PSI 3WAY (MISCELLANEOUS) ×1 IMPLANT
SYR 10ML LL (SYRINGE) ×8 IMPLANT
SYR 30ML LL (SYRINGE) ×1 IMPLANT
SYR MEDRAD MARK V 150ML (SYRINGE) IMPLANT
SYRINGE 20CC LL (MISCELLANEOUS) ×4 IMPLANT
TAPE VIPERTRACK RADIOPAQ 30X (MISCELLANEOUS) IMPLANT
TAPE VIPERTRACK RADIOPAQUE (MISCELLANEOUS) ×1
TOWEL GREEN STERILE (TOWEL DISPOSABLE) ×4 IMPLANT
TUBING HIGH PRESSURE 120CM (CONNECTOR) ×1 IMPLANT
WATER STERILE IRR 1000ML POUR (IV SOLUTION) IMPLANT
WIRE AMPLATZ SS-J .035X180CM (WIRE) ×1 IMPLANT
WIRE BENTSON .035X145CM (WIRE) ×3 IMPLANT
WIRE HI TORQ VERSACORE J 260CM (WIRE) ×1 IMPLANT
WIRE ROSEN-J .035X260CM (WIRE) ×1 IMPLANT

## 2017-12-12 NOTE — Progress Notes (Signed)
Patient admitted fromE PACU. Alert and oriented. Oriented to room and surroundings. Spouse by the bedside. Telemetry applied and CCMD called.

## 2017-12-12 NOTE — OR Nursing (Signed)
40fr sheath pulled from the left femoral artery. ACT 136. Patient's vital signs were stable prior to, during, and after sheath pull. Signs and symptoms of bleeding and hematoma not present.

## 2017-12-12 NOTE — Progress Notes (Signed)
PROGRESS NOTE    Peter Cochran  ZOX:[id number] DOB: Sep 09, 1955 DOA: 12/06/2017 PCP: Floria Raveling, FNP   Brief Narrative:  62 year old with a history of CHF with ejection fraction 25%, peripheral vascular disease, hypothyroidism, hypertension, chronic venous and arterial ulcer comes to the hospital for evaluation of worsening of the left foot infection.  He was found to be septic secondary to left lower extremity infection.  He was transported here to [facility] for further evaluation by vascular surgery.  Patient was initially treated with vancomycin and aztreonam which was later switched.   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  Patient has previous history of pseudomonal infection.  Will have PICC line placed after his vascular angios today. - Will likely need PICC line - Plans for angiogram by vascular surgery today - Appreciate input from Dr. Ali Lowe depending on his revascularization procedure today.   Acute kidney injury, resolved -Creatinine appears to be at his baseline.  Closely monitor this  Chronic systolic congestive heart failure, ejection fraction 25% -Patient appears to be more or less euvolemic in nature.  No signs of fluid overload/shortness of breath or chest pain.  Continue Lasix IV.  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  Foley is in place.  If does not do well with voiding trial here, he will need temporary Foley with outpatient follow-up with urology.   DVT prophylaxis: Lovenox Code Status: Full Code  Family Communication: None at bedside Disposition Plan: Maintain inpatient stay.  Hopefully we can discharge him in next 2-3 days  Consultants:   Vascular   Ortho  ID  Procedures:   Plans for angios today   Subjective: No complaints.  He is feeling better.  Review of Systems Otherwise negative except as per HPI, including: General = no fevers, chills, dizziness, malaise, fatigue HEENT/EYES = negative for pain, redness, loss of vision, double vision, blurred vision, loss of hearing, sore throat, hoarseness, dysphagia Cardiovascular= negative for chest pain, palpitation, murmurs, lower extremity swelling Respiratory/lungs= negative for shortness of breath, cough, hemoptysis, wheezing, mucus production Gastrointestinal= negative for nausea, vomiting,, abdominal pain, melena, hematemesis Genitourinary= negative for Dysuria, Hematuria, Change in Urinary Frequency MSK = Negative for arthralgia, myalgias, Back Pain, Joint swelling  Neurology= Negative for headache, seizures, numbness, tingling  Psychiatry= Negative for anxiety, depression, suicidal and homocidal ideation Allergy/Immunology= Medication/Food allergy as listed  Skin= Negative  itching   Objective: Vitals:   12/11/17 1219 12/11/17 2113 12/12/17 0530 12/12/17 1247  BP: (!) 84/59 108/62 95/66 96/64   Pulse: (!) 114 92 (!) 102 (!) 101  Resp: Temp: 98.7 F (37.1 C) 97.7 F (36.5 C) 98 F (36.7 C) 98.3 F (36.8 C)  TempSrc: Oral Oral Oral Oral  SpO2: 100% 99% 100% 100%  Weight:      Height:        Intake/Output Summary (Last 24 hours)  at 12/12/2017 1315 Last data filed at 12/12/2017 0600 Gross per 24 hour  Intake 290 ml  Output 875 ml  Net -585 ml   Filed Weights   12/06/17 1922 12/08/17 0753 12/09/17  1259  Weight: 98.4 kg (217 lb) 91.1 kg (200 lb 12.8 oz) 94.6 kg (208 lb 8 oz)    Examination: Constitutional: NAD, calm, comfortable Eyes: PERRL, lids and conjunctivae normal ENMT: Mucous membranes are moist. Posterior pharynx clear of any exudate or lesions.Normal dentition.  Neck: normal, supple, no masses, no thyromegaly Respiratory: clear to auscultation bilaterally, no wheezing, no crackles. Normal respiratory effort. No accessory muscle use.  Cardiovascular: Regular rate and rhythm, no murmurs / rubs / gallops. No extremity edema. 2+ pedal pulses. No carotid bruits.  Abdomen: no tenderness, no masses palpated. No hepatosplenomegaly. Bowel sounds positive.  Musculoskeletal: no clubbing / cyanosis. No joint deformity upper and lower extremities. Good ROM, no contractures. Normal muscle tone.  Skin: Bilateral lower extremity swelling with erythema noted, gangrenous first and second toe on the left side lower extremity.  Dressing is in place which is clean and intact. Neurologic: CN 2-12 grossly intact. Sensation intact, DTR normal. Strength 5/5 in all 4.  Psychiatric: Normal judgment and insight. Alert and oriented x 3. Normal mood.   Data Reviewed:   CBC: Recent Labs  Lab 12/06/17 1227  12/08/17 0347 12/09/17 0507 12/10/17 0610 12/11/17 0554 12/12/17 0505  WBC 17.7*   < > 12.2* 10.7* 10.9* 11.5* 12.1*  NEUTROABS 15.7*  --   --   --   --   --   --   HGB 13.2   < > 12.1* 11.2* 11.4* 11.4* 12.1*  HCT 39.1   < > 36.3* 34.2* 35.0* 34.8* 36.4*  MCV 92.0   < > 94.3 96.1 96.2 95.1 94.8  PLT 225   < > 200 180 203 202 211   < > = values in this interval not displayed.   Basic Metabolic Panel: Recent Labs  Lab 12/08/17 0347 12/09/17 0507 12/10/17 0610 12/11/17 0554 12/12/17 0505  NA 137 137 139 136 134*  K 4.6 4.6 4.4 4.2 3.9  CL 110 110 107 98* 100*  CO2 18* 20* GLUCOSE 102* 111* 110* 102* 99  BUN 41* 28* 24* 27* 28*  CREATININE 1.20 0.94 1.05 1.02 0.98  CALCIUM  8.5* 8.5* 8.7* 8.6* 8.6*  MG  --   --   --   --  1.6*   GFR: Estimated Creatinine Clearance: 94.1 mL/min (by C-G formula based on SCr of 0.98 mg/dL). Liver Function Tests: Recent Labs  Lab 12/06/17 1227 12/07/17 0550  AST 25 22  ALT 21 18  ALKPHOS 70 63  BILITOT 0.6 0.6  PROT 7.8 7.1  ALBUMIN 2.8* 2.4*   No results for input(s): LIPASE, AMYLASE in the last 168 hours. No results for input(s): AMMONIA in the last 168 hours. Coagulation Profile: Recent Labs  Lab 12/07/17 0550 12/12/17 0505  INR 1.11 1.01   Cardiac Enzymes: No results for input(s): CKTOTAL, CKMB, CKMBINDEX, TROPONINI in the last 168 hours. BNP (last 3 results) No results for input(s): PROBNP in the last 8760 hours. HbA1C: No results for input(s): HGBA1C in the last 72 hours. CBG: No results for input(s): GLUCAP in the last 168 hours. Lipid Profile: No results for input(s): CHOL, HDL, LDLCALC, TRIG, CHOLHDL, LDLDIRECT in the last 72 hours. Thyroid Function Tests: No results for input(s): TSH, T4TOTAL, FREET4, T3FREE, THYROIDAB in the last 72  hours. Anemia Panel: No results for input(s): VITAMINB12, FOLATE, FERRITIN, TIBC, IRON, RETICCTPCT in the last 72 hours. Sepsis Labs: Recent Labs  Lab 12/06/17 1245  LATICACIDVEN 1.74    Recent Results (from the past 240 hour(s))  Surgical pcr screen     Status: None   Collection Time: 12/12/17  5:42 AM  Result Value Ref Range Status   MRSA, PCR NEGATIVE NEGATIVE Final   Staphylococcus aureus NEGATIVE NEGATIVE Final    Comment: (NOTE) The Xpert SA Assay (FDA approved for NASAL specimens in patients 49 years of age and older), is one component of a comprehensive surveillance program. It is not intended to diagnose infection nor to guide or monitor treatment. Performed at [facility], [address], [place], [place] [address]          Radiology Studies: No results found.      Scheduled Meds: . aspirin EC  81 mg Oral Daily  .  atorvastatin  40 mg Oral Daily  . carvedilol  3.125 mg Oral BID WC  . collagenase   Topical Daily  . DULoxetine  30 mg Oral Daily  . enoxaparin (LOVENOX) injection  40 mg Subcutaneous Daily  . feeding supplement (ENSURE ENLIVE)  237 mL Oral BID BM  . furosemide  40 mg Intravenous Daily  . Gerhardt's butt cream   Topical TID  . hydrocerin   Topical BID  . levothyroxine  100 mcg Oral QAC breakfast  . multivitamin with minerals  1 tablet Oral Daily  . spironolactone  25 mg Oral Daily  . tamsulosin  0.4 mg Oral QPC breakfast   Continuous Infusions: . piperacillin-tazobactam (ZOSYN)  IV 3.375 g (12/12/17 0935)     LOS: 6 days    I have spent 30 minutes face to face with the patient and on the ward discussing the patients care, assessment, plan and disposition with other care givers. >50% of the time was devoted counseling the patient about the risks and benefits of treatment and coordinating care.     Pier Bosher Joline Maxcy, MD [facility] Pager [phone]   If 7PM-7AM, please contact night-coverage [email or web address] Password [email or web address] 12/12/2017, 1:15 PM

## 2017-12-12 NOTE — Plan of Care (Signed)
  Problem: Education: Goal: Knowledge of General Education information will improve Outcome: Progressing   Problem: Coping: Goal: Level of anxiety will decrease Outcome: Progressing   

## 2017-12-12 NOTE — Anesthesia Preprocedure Evaluation (Addendum)
Anesthesia Evaluation  Patient identified by MRN, date of birth, ID band Patient awake    Reviewed: Allergy & Precautions, NPO status , Patient's Chart, lab work & pertinent test results, reviewed documented beta blocker date and time   Airway Mallampati: II  TM Distance: >3 FB Neck ROM: Full    Dental  (+) Dental Advisory Given   Pulmonary Current Smoker,    breath sounds clear to auscultation       Cardiovascular + Peripheral Vascular Disease and +CHF   Rhythm:Regular Rate:Normal  '19 TTE - EF 25-30%, inferolateral and apical akinesis, diastolic dysfunction, mildly dilated LA   Neuro/Psych  Neuromuscular disease negative psych ROS   GI/Hepatic negative GI ROS, Neg liver ROS,   Endo/Other  Hypothyroidism   Renal/GU negative Renal ROS  negative genitourinary   Musculoskeletal negative musculoskeletal ROS (+)   Abdominal   Peds  Hematology  (+) anemia ,   Anesthesia Other Findings   Reproductive/Obstetrics                             Anesthesia Physical Anesthesia Plan  ASA: IV  Anesthesia Plan: General   Post-op Pain Management:    Induction: Intravenous  PONV Risk Score and Plan: 2 and Treatment may vary due to age or medical condition, Ondansetron and Midazolam  Airway Management Planned: LMA  Additional Equipment: None  Intra-op Plan:   Post-operative Plan: Extubation in OR  Informed Consent: I have reviewed the patients History and Physical, chart, labs and discussed the procedure including the risks, benefits and alternatives for the proposed anesthesia with the patient or authorized representative who has indicated his/her understanding and acceptance.   Dental advisory given  Plan Discussed with: CRNA and Anesthesiologist  Anesthesia Plan Comments:         Anesthesia Quick Evaluation

## 2017-12-12 NOTE — OR Nursing (Signed)
Patient's dressings on left lower leg were saturated when patient arrived in the OR. New dressings were applied to both the left and right lower legs.

## 2017-12-12 NOTE — Consult Note (Signed)
[facility]    Date of Admission:  12/06/2017   Total days of antibiotics 7        Zosyn 5/28-        Cefepime 5/25-5/28        Vanc/Aztreonam 5/24       Reason for Consult: Chronic LE ulcer infection     Referring Provider: Dr. Maryfrances Bunnell  Primary Care Provider: Dr. Merita Norton   Assessment: Peter Cochran is a 62 y.o. male with history of CHF, PAD, chronic venous ulcers x 4 years, HTN, and hypothyroidism who presented on 5/24 with sepsis secondary to bilateral lower extremity cellulitis and gangrene of L 1st and 2nd toes. Wound cx from OSH with ciprofloxacin-resistant Pseudomonas. Initially treated with vancomycin, clindamycin, and aztreonam. Leukocytosis slowly trending up. Currently on Zosyn   Plan: 1. Bilateral lower extremities cellulitis: Continue Zosyn for pseudomonal and anaerobic coverage. Recommend probably 4 weeks after intervention.  2. PAD/chronic venous stasis ulcers/dry gangrene:  Vascular and ortho following. Plan for angiography with possible revascularization today and possible L transmetatarsal amputation pending angiography results. Wound care following as well.   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 aspirin EC  81 mg Oral Daily  . atorvastatin  40 mg Oral Daily  . carvedilol  3.125 mg Oral BID WC  . collagenase   Topical Daily  . DULoxetine  30 mg Oral Daily  . enoxaparin (LOVENOX) injection  40 mg Subcutaneous Daily  . feeding supplement (ENSURE ENLIVE)  237 mL Oral BID BM  . furosemide  40 mg Intravenous Daily  . Gerhardt's butt cream   Topical TID  . hydrocerin   Topical BID  . levothyroxine  100 mcg Oral QAC breakfast  . multivitamin with minerals  1 tablet Oral Daily  . spironolactone   25 mg Oral Daily  . tamsulosin  0.4 mg Oral QPC breakfast   Continuous Infusions: . piperacillin-tazobactam (ZOSYN)  IV 3.375 g (12/12/17 0935)   PRN Meds:.bisacodyl, HYDROcodone-acetaminophen, magnesium citrate, ondansetron **OR** ondansetron (ZOFRAN) IV, senna-docusate  Subjective:  No acute events overnight. Patient is doing well this morning. Denies fever, chills, abdominal pain, and diarrhea. Aware of procedure today. Eager to go home.   Review of Systems: Review of Systems  Constitutional: Negative for chills, fever and malaise/fatigue.  Respiratory: Negative for cough and shortness of breath.   Cardiovascular: Positive for leg swelling. Negative for chest pain and palpitations.  Gastrointestinal: Negative for abdominal pain, diarrhea, nausea and vomiting.  Musculoskeletal: Negative for myalgias.    Past Medical History:  Diagnosis Date  . Cellulitis    mild.  L leg  . CHF (congestive heart failure) (HCC)    Dr. Norman Herrlich  . 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  . Hand fracture, right    Dr. Linda Hedges  . Hypothyroidism (acquired)   . Leg ulcer, left (HCC)   . PAD (peripheral artery disease) (HCC)   . Periorbital hematoma, right   . Peripheral neuropathy   . Toe ulcer (HCC)    Left  . Wound of left leg    Non-healing. Dr. Liston Alba, Dr. Bynum Bellows.    Social History  Tobacco Use  . Smoking status: Current Every Day Smoker    Packs/day: 2.00    Years: 50.00    Pack years: 100.00    Types: Cigarettes  . Smokeless tobacco: Never Used  Substance Use Topics  . Alcohol use: Not Currently  . Drug use: Not Currently    Family History  Problem Relation Age of Onset  . Hypertension Mother   . Hyperlipidemia Mother   . Heart attack Father   . Diabetes Father   . Hypertension Father   . Hyperlipidemia Father   . Stroke Maternal Grandmother    Allergies  Allergen Reactions  . Oxytetracycline Itching and Swelling    Facial swelling  .  Sulfa Antibiotics Rash    OBJECTIVE: Blood pressure 95/66, pulse (!) 102, temperature 98 F (36.7 C), temperature source Oral, resp. rate 16, height  (1.803 m), weight 208 lb 8 oz (94.6 kg), SpO2 100 %.  Physical Exam  Constitutional: He is oriented to person, place, and time.  Elderly male, well-nourished, well-developed, sitting up in chair in no acute distress  Cardiovascular: Normal rate, regular rhythm and normal heart sounds. Exam reveals no gallop and no friction rub.  No murmur heard. Pulmonary/Chest: Effort normal and breath sounds normal. No respiratory distress.  Abdominal: Soft. Bowel sounds are normal. There is no tenderness.  Musculoskeletal: He exhibits edema (bilateral LE edema  ).  Neurological: He is alert and oriented to person, place, and time.  Skin:  Bilateral lower extremities are cool to the touch, erythematous and edematous below the knee. Non-healing wound on lateral L ankle without changes. Gangrenous L 1st and 2nd toes similar to prior examination.     Lab Results Lab Results  Component Value Date   WBC 12.1 (H) 12/12/2017   HGB 12.1 (L) 12/12/2017   HCT 36.4 (L) 12/12/2017   MCV 94.8 12/12/2017   PLT 211 12/12/2017    Lab Results  Component Value Date   CREATININE 0.98 12/12/2017   BUN 28 (H) 12/12/2017   NA 134 (L) 12/12/2017   K 3.9 12/12/2017   CL 100 (L) 12/12/2017   CO2 28 12/12/2017    Lab Results  Component Value Date   ALT 18 12/07/2017   AST 22 12/07/2017   ALKPHOS 63 12/07/2017   BILITOT 0.6 12/07/2017     Microbiology: Recent Results (from the past 240 hour(s))  Surgical pcr screen     Status: None   Collection Time: 12/12/17  5:42 AM  Result Value Ref Range Status   MRSA, PCR NEGATIVE NEGATIVE Final   Staphylococcus aureus NEGATIVE NEGATIVE Final    Comment: (NOTE) The Xpert SA Assay (FDA approved for NASAL specimens in patients 37 years of age and older), is one component of a comprehensive surveillance program.  It is not intended to diagnose infection nor to guide or monitor treatment. Performed at [facility], [address], [place], [place] [address]     Burna Cash, MD [facility] [phone] pager   [phone] cell 12/12/2017, 10:09 AM

## 2017-12-12 NOTE — Interval H&P Note (Signed)
   History and Physical Update  The patient was interviewed and re-examined.  The patient's previous History and Physical has been reviewed and is unchanged from my consult.  There is no change in the plan of care: aortogram, left leg runoff, and possible intervention.   I discussed with the patient the nature of angiographic procedures, especially the limited patencies of any endovascular intervention.    The patient is aware of that the risks of an angiographic procedure include but are not limited to: bleeding, infection, access site complications, renal failure, embolization, rupture of vessel, dissection, arteriovenous fistula, possible need for emergent surgical intervention, possible need for surgical procedures to treat the patient's pathology, anaphylactic reaction to contrast, and stroke and death.    The patient is aware of the risks and agrees to proceed.  Lab Results  Component Value Date   CREATININE 0.98 12/12/2017   CREATININE 1.02 12/11/2017   CREATININE 1.05 12/10/2017    Leonides Sake, MD, FACS [facility] Office: [phone] Pager: [phone]  12/12/2017, 3:40 PM

## 2017-12-12 NOTE — Op Note (Signed)
OPERATIVE NOTE   PROCEDURE: 1.  Right common femoral artery cannulation under ultrasound guidance 2.  Placement of catheter in aorta 3.  Aortogram 4.  Left second order arterial selection 5.  Left leg runoff 6.  Subintimal angioplasty and stenting left superficial femoral artery (Innova 5 mm x 100 mm)  PRE-OPERATIVE DIAGNOSIS: left foot gangrene  POST-OPERATIVE DIAGNOSIS: same as above   SURGEON: Adele Barthel, MD  ANESTHESIA: conscious sedation  ESTIMATED BLOOD LOSS: 50 cc  CONTRAST: 105 cc  FINDING(S):  Aorta: patent   Right Left  RA patent patent  CIA patent patent  EIA patent patent  IIA patent patent  CFA patent patent  SFA  Patent, 10 cm occlusion: resolved with stenting and angioplasty  PFA  patent  Pop  patent  Trif  patent  AT  Patent, co-dominant runoff  Pero  Patent  PT  Patent, co-dominant runoff   SPECIMEN(S):  none  INDICATIONS:   Peter Cochran is a 62 y.o. male who presents with bilateral venous stasis ulcers and left foot gangrene.  The patient presents for: aortogram, left leg runoff, and possible intervention.  I discussed with the patient the nature of angiographic procedures, especially the limited patencies of any endovascular intervention.  The patient is aware of that the risks of an angiographic procedure include but are not limited to: bleeding, infection, access site complications, renal failure, embolization, rupture of vessel, dissection, possible need for emergent surgical intervention, possible need for surgical procedures to treat the patient's pathology, and stroke and death.  The patient is aware of the risks and agrees to proceed.  DESCRIPTION: After full informed consent was obtained from the patient, the patient was brought back to the angiography suite.  The patient was placed supine upon the angiography table and connected to cardiopulmonary monitoring equipment.  The patient was then given conscious sedation, the amounts of which  are documented in the patient's chart.  A circulating radiologic technician maintained continuous monitoring of the patient's cardiopulmonary status.  Additionally, the control room radiologic technician provided backup monitoring throughout the procedure.  The patient was prepped and drape in the standard fashion for an angiographic procedure.  At this point, attention was turned to the right groin.  Under ultrasound guidance, the subcutaneous tissue surrounding the right common femoral artery was anesthesized with 1% lidocaine with epinephrine.  Under Sonosite guidance, the patency of the artery was noted to be: deep and patent.  Ultrasound image was permanently recorded.  The artery was then cannulated with a micropuncture needle.  The microwire was advanced into the iliac arterial system.  The needle was exchanged for a microsheath, which was loaded into the common femoral artery over the wire.  The microwire was exchanged for an Amplatz wire which was advanced into the aorta.  The microsheath was then exchanged for a 5-Fr sheath which was loaded into the common femoral artery.    The Omniflush catheter was then loaded over the wire up to the level of L1.  The catheter was connected to the power injector circuit.  After de-airring and de-clotting the circuit, a power injector aortogram was completed.  The findings are listed above.    Using a Bentson wire and RIM catheter, the left common iliac artery was selected.  The catheter and wire were advanced into the external iliac artery.  The left leg runoff was completed in stations with hand injections.  The findings are tabulated above.  Based on the images, I felt an attempt  at recannulation of the right superficial femoral artery was indicated.  The wire was exchanged for a Rosen wire.  The patient's right femoral sheath was exchanged for a 6-Fr Destination sheath, which was lodged in the left common femoral artery.  The dilator was removed.    The  patient was given 10000 units of Heparin intravenously, which was a therapeutic bolus.  The left superficial femoral artery and profunda femoral artery superimposed each other so I had to try multiple oblique projections to see the separation of the two arteries.  Eventually, I was able to select the right superficial femoral artery with a BER-2 catheter and glidewire.  I then was able to navigate into the patent segment of superficial femoral artery.  The wire was exchanged for a long Glidewire and then the catheter exchanged for a Quickcross catheter.  I then performed a subintimal dissection of the occluded superficial femoral artery segment.  I re-entered just distal to the occluded segment.  I pushed the catheter back into the distal superficial femoral artery and did a hand injection to verify re-entry.  I exchanged the wire for a Versacore wire.  A 5 mm x 100 mm angioplasty balloon was centered on the occluded segment and inflated to 10 atm for 2 minutes.  The balloon was deflated and removed.  I did a hand injection which demonstrated diffuse dissection in this recannulated superficial femoral artery segment, consistent with a subintimal angioplasty.  I selected a 5 mm x 100 mm Innova stent and centered it on the occluded segment.  I had to deploy the stent 5 mm more distal than I wanted due to the re-entry point being slightly more distal.  The stent was post-dilated at 10 atm for 1 minute with the 5 mm x 100 mm balloon.  I deflated the balloon and removed it.  Completion injection demonstrated complete resolution of the superficial femoral artery occlusion.  There was a 5 mm segment proximally that was not stented, but this segment appeared to be widely patent.  I did a tibial injection and the flow was rapid with greatly augmented anterior tibial artery and posterior tibial artery flow.  There was no evidence of distal embolization.   COMPLICATIONS: none  CONDITION: stable   Adele Barthel, MD,  [facility] [place] Office: [phone] Pager: [phone]  12/12/2017, 5:51 PM

## 2017-12-12 NOTE — Anesthesia Postprocedure Evaluation (Signed)
Anesthesia Post Note  Patient: Antwine Agosto  Procedure(s) Performed: AORTOGRAM LEFT LOWER EXTREMITY RUNOFF (Left ) PERIPHERAL VASCULAR BALLOON ANGIOPLASTY LEFT SFA (Left )     Patient location during evaluation: PACU Anesthesia Type: General Level of consciousness: awake and alert Pain management: pain level controlled Vital Signs Assessment: post-procedure vital signs reviewed and stable Respiratory status: spontaneous breathing, nonlabored ventilation, respiratory function stable and patient connected to nasal cannula oxygen Cardiovascular status: stable Postop Assessment: no apparent nausea or vomiting Anesthetic complications: no    Last Vitals:  Vitals:   12/12/17 2000 12/12/17 2015  BP: (!) 87/58 (!) 85/54  Pulse: (!) 102 (!) 107  Resp: (!) 27 (!) 26  Temp:    SpO2: 97% 98%    Last Pain:  Vitals:   12/12/17 1945  TempSrc:   PainSc: 0-No pain                 Beryle Lathe

## 2017-12-12 NOTE — Transfer of Care (Signed)
Immediate Anesthesia Transfer of Care Note  Patient: Peter Cochran  Procedure(s) Performed: AORTOGRAM LEFT LOWER EXTREMITY RUNOFF (Left ) PERIPHERAL VASCULAR BALLOON ANGIOPLASTY LEFT SFA (Left )  Patient Location: PACU  Anesthesia Type:General  Level of Consciousness: awake and patient cooperative  Airway & Oxygen Therapy: Patient Spontanous Breathing  Post-op Assessment: Report given to RN and Post -op Vital signs reviewed and stable  Post vital signs: Reviewed and stable  Last Vitals:  Vitals Value Taken Time  BP 91/63 12/12/2017  6:30 PM  Temp    Pulse 95 12/12/2017  6:31 PM  Resp 30 12/12/2017  6:31 PM  SpO2 94 % 12/12/2017  6:31 PM  Vitals shown include unvalidated device data.  Last Pain:  Vitals:   12/12/17 1247  TempSrc: Oral  PainSc:       Patients Stated Pain Goal: 4 (12/10/17 1114)  Complications: No apparent anesthesia complications

## 2017-12-12 NOTE — Anesthesia Procedure Notes (Signed)
Procedure Name: LMA Insertion Date/Time: 12/12/2017 3:59 PM Performed by: Rosiland Oz, CRNA Pre-anesthesia Checklist: Patient identified, Emergency Drugs available, Suction available, Patient being monitored and Timeout performed Patient Re-evaluated:Patient Re-evaluated prior to induction Oxygen Delivery Method: Circle system utilized Preoxygenation: Pre-oxygenation with 100% oxygen Induction Type: IV induction LMA: LMA inserted LMA Size: 5.0 Number of attempts: 1 Placement Confirmation: ETT inserted through vocal cords under direct vision,  breath sounds checked- equal and bilateral and positive ETCO2 Tube secured with: Tape Dental Injury: Teeth and Oropharynx as per pre-operative assessment

## 2017-12-13 ENCOUNTER — Encounter (HOSPITAL_COMMUNITY): Payer: Self-pay | Admitting: Vascular Surgery

## 2017-12-13 ENCOUNTER — Ambulatory Visit: Payer: PRIVATE HEALTH INSURANCE | Admitting: Cardiology

## 2017-12-13 ENCOUNTER — Telehealth: Payer: Self-pay | Admitting: Vascular Surgery

## 2017-12-13 DIAGNOSIS — E440 Moderate protein-calorie malnutrition: Secondary | ICD-10-CM

## 2017-12-13 DIAGNOSIS — Z792 Long term (current) use of antibiotics: Secondary | ICD-10-CM

## 2017-12-13 LAB — BASIC METABOLIC PANEL
Anion gap: 11 (ref 5–15)
BUN: 23 mg/dL — ABNORMAL HIGH (ref 6–20)
CHLORIDE: 100 mmol/L — AB (ref 101–111)
CO2: 23 mmol/L (ref 22–32)
CREATININE: 1 mg/dL (ref 0.61–1.24)
Calcium: 8.3 mg/dL — ABNORMAL LOW (ref 8.9–10.3)
GFR calc non Af Amer: >60 mL/min (ref >60)
Glucose, Bld: 149 mg/dL — ABNORMAL HIGH (ref 65–99)
POTASSIUM: 4.8 mmol/L (ref 3.5–5.1)
SODIUM: 134 mmol/L — AB (ref 135–145)

## 2017-12-13 LAB — CBC
HEMATOCRIT: 37.4 % — AB (ref 39.0–52.0)
HEMOGLOBIN: 12.2 g/dL — AB (ref 13.0–17.0)
MCH: 31.4 pg (ref 26.0–34.0)
MCHC: 32.6 g/dL (ref 30.0–36.0)
MCV: 96.4 fL (ref 78.0–100.0)
Platelets: 199 10*3/uL (ref 150–400)
RBC: 3.88 MIL/uL — AB (ref 4.22–5.81)
RDW: 15.3 % (ref 11.5–15.5)
WBC: 16.4 10*3/uL — AB (ref 4.0–10.5)

## 2017-12-13 LAB — MAGNESIUM: Magnesium: 1.8 mg/dL (ref 1.7–2.4)

## 2017-12-13 LAB — PLATELET INHIBITION P2Y12: PLATELET FUNCTION P2Y12: 204 [PRU] (ref 194–418)

## 2017-12-13 MED ORDER — PIPERACILLIN-TAZOBACTAM IV (FOR PTA / DISCHARGE USE ONLY): 3.3750 g | Freq: Three times a day (TID) | INTRAVENOUS | 0 refills | Status: DC

## 2017-12-13 NOTE — Progress Notes (Signed)
Patient ID: Peter ManlyRobert Cochran, male   DOB: 1955-07-17, 62 y.o.   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  Patient is status post excellent revascularization with Dr. Imogene Burnhen with excellent improvement in the arterial flow.  Patient has massive weeping edema from the left leg he is sitting with his leg dependent there is a puddle of fluid on the floor draining from his leg.  He has a massive venous ulcer laterally as well as ulcers posteriorly on the calf.  The wounds are deep with hyper granulation tissue with necrotic tissue at the base consistent with chronic mixed venous and arterial insufficiency ulceration.  Patient does have dry gangrene of the great toe and second toe with ischemic changes to the lesser toes but there is no cellulitis no purulence there is no risk of infection right now from the toes.  Patient's biggest problem is resolving the venous insufficiency in order to improve the tissue circulation to heal a transmetatarsal amputation.  I will have wound ostomy continence nursing apply a medical compression stocking, patient is to have this changed at least daily initially this may be needed to be changed twice a day due to the massive drainage.  Discussed with patient the importance of keeping his foot elevated level with his heart he cannot sit with his foot dependent at any time.  Patient states his wife will be able  to change the sock home.  I will follow-up in the office as out patient in 1 week.  Wound care nursing will demonstrate the proper application of the sock.  Patient will need home health nursing to check on him at least 3 times a week to ensure proper care of the legs and compliance with the compression wrap and elevation.  Patient could be discharged to home tomorrow if all home health services are  established.

## 2017-12-13 NOTE — Progress Notes (Signed)
PROGRESS NOTE    Peter Cochran  ZOX:[id number] DOB: 08-Feb-1956 DOA: 12/06/2017 PCP: Floria Raveling, FNP   Brief Narrative:  62 year old with a history of CHF with ejection fraction 25%, peripheral vascular disease, hypothyroidism, hypertension, chronic venous and arterial ulcer comes to the hospital for evaluation of worsening of the left foot infection.  He was found to be septic secondary to left lower extremity infection.  He was transported here to [facility] for further evaluation by vascular surgery.  Patient was initially treated with vancomycin and aztreonam which was later switched.  Patient underwent angioplasty and stenting of SFA occlusion on 5/30 which he tolerated well.  He was started on aspirin and Plavix, P2 Y 12 platelet inhibition labs were sent.   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  PICC line order has been placed - Angiogram yesterday showed occlusion of SFA therefore angioplasty and stenting was performed on the left side.  Continue his aspirin and Plavix at this time. - I spoke with Dr. Lajoyce Corners this morning who will come evaluate the patient and determine if patient still needs amputation of his toes.  Acute kidney injury, resolved -This is resolved, closely monitor his urine output and renal function daily.  Chronic systolic congestive heart failure, ejection fraction 25% -Patient appears to be euvolemic in nature overall but given significant lower extremity swelling we will continue IV  Lasix.  Sherryll Burger is on hold, will resume soon.  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  Foley is in place.  If does not do well with voiding trial here, he will need temporary Foley with outpatient follow-up with urology.   DVT prophylaxis: Lovenox Code Status: Full Code  Family Communication: None at bedside Disposition Plan: Maintain inpatient stay for another 2 days.  Pending orthopedic determination for amputation and making arrangements for long-term outpatient antibiotics per infectious disease recommendations.  Consultants:   Vascular   Ortho  ID  Procedures:   Status post left lower extremity angiogram 5/30   Subjective: Patient continues to state that he wants to go home but I explained to him why the hospital stay is necessary.  He understands.  No other new complaints this morning, tolerating oral diet well.  Review of Systems Otherwise negative except as per HPI, including: General = no fevers, chills, dizziness, malaise, fatigue HEENT/EYES = negative for pain, redness, loss of vision, double vision, blurred vision, loss of hearing, sore throat, hoarseness, dysphagia Cardiovascular= negative for chest pain, palpitation, murmurs, lower extremity swelling Respiratory/lungs= negative for shortness of breath, cough, hemoptysis, wheezing, mucus production Gastrointestinal= negative for nausea, vomiting,, abdominal pain, melena, hematemesis Genitourinary= negative for Dysuria, Hematuria, Change in Urinary Frequency MSK = Negative for arthralgia, myalgias, Back Pain, Joint swelling  Neurology= Negative for headache, seizures, numbness, tingling  Psychiatry= Negative for anxiety, depression, suicidal and homocidal ideation Allergy/Immunology= Medication/Food allergy as  listed   Skin= left lower extremity gangrene of the first and second toe   Objective: Vitals:   12/12/17 2045 12/12/17 2100 12/12/17 2147 12/13/17 0451  BP: (!) 90/58 (!) 93/58 (!) 89/71 95/61  Pulse: (!) 108 (!) 109 (!) 119 (!) 106  Resp: Temp:  97.9 F (36.6 C) (!) 97.1 F (36.2 C) 97.9 F (36.6 C)  TempSrc:  Oral Oral Oral  SpO2: 98% 99% 96% 98%  Weight:   90.8 kg (200 lb 2.8 oz) 90 kg (198 lb 6.6 oz)  Height:    (1.778 m)     Intake/Output Summary (Last 24 hours) at 12/13/2017 1107 Last data filed at 12/13/2017 0825 Gross per 24 hour  Intake 410 ml  Output 1110 ml  Net -700 ml   Filed Weights   12/12/17 1443 12/12/17 2147 12/13/17 0451  Weight: 94.6 kg (208 lb 8 oz) 90.8 kg (200 lb 2.8 oz) 90 kg (198 lb 6.6 oz)    Examination: Constitutional: NAD, calm, comfortable Eyes: PERRL, lids and conjunctivae normal ENMT: Mucous membranes are moist. Posterior pharynx clear of any exudate or lesions.Normal dentition.  Neck: normal, supple, no masses, no thyromegaly Respiratory: clear to auscultation bilaterally, no wheezing, no crackles. Normal respiratory effort. No accessory muscle use.  Cardiovascular: Regular rate and rhythm, no murmurs / rubs / gallops. No extremity edema. 2+ pedal pulses. No carotid bruits.  Abdomen: no tenderness, no masses palpated. No hepatosplenomegaly. Bowel sounds positive.  Musculoskeletal: no clubbing / cyanosis. No joint deformity upper and lower extremities. Good ROM, no contractures. Normal muscle tone.  Skin: Bilateral lower extremity pitting edema 2+, gangrenous toe noted on the left lower extremity first and second toe.  Clean dressing is in place. Neurologic: CN 2-12 grossly intact. Sensation intact, DTR normal. Strength 5/5 in all 4.  Psychiatric: Normal judgment and insight. Alert and oriented x 3. Normal mood.    Data Reviewed:   CBC: Recent Labs  Lab 12/06/17 1227  12/09/17 0507 12/10/17 0610 12/11/17 0554  12/12/17 0505 12/13/17 1021  WBC 17.7*   < > 10.7* 10.9* 11.5* 12.1* 16.4*  NEUTROABS 15.7*  --   --   --   --   --   --   HGB 13.2   < > 11.2* 11.4* 11.4* 12.1* 12.2*  HCT 39.1   < > 34.2* 35.0* 34.8* 36.4* 37.4*  MCV 92.0   < > 96.1 96.2 95.1 94.8 96.4  PLT 225   < > 180 203 202 211 199   < > = values in this interval not displayed.   Basic Metabolic Panel: Recent Labs  Lab 12/08/17 0347 12/09/17 0507 12/10/17 0610 12/11/17 0554 12/12/17 0505  NA 137 137 139 136 134*  K 4.6 4.6 4.4 4.2 3.9  CL 110 110 107 98* 100*  CO2 18* 20* GLUCOSE 102* 111* 110* 102* 99  BUN 41* 28* 24* 27* 28*  CREATININE 1.20 0.94 1.05 1.02 0.98  CALCIUM 8.5* 8.5* 8.7* 8.6* 8.6*  MG  --   --   --   --  1.6*   GFR: Estimated Creatinine Clearance: 90.5 mL/min (by C-G formula based on SCr of 0.98 mg/dL). Liver Function Tests: Recent Labs  Lab 12/06/17 1227 12/07/17 0550  AST 25 22  ALT 21 18  ALKPHOS 70 63  BILITOT 0.6 0.6  PROT 7.8 7.1  ALBUMIN 2.8* 2.4*   No results for input(s): LIPASE, AMYLASE in the last  168 hours. No results for input(s): AMMONIA in the last 168 hours. Coagulation Profile: Recent Labs  Lab 12/07/17 0550 12/12/17 0505  INR 1.11 1.01   Cardiac Enzymes: No results for input(s): CKTOTAL, CKMB, CKMBINDEX, TROPONINI in the last 168 hours. BNP (last 3 results) No results for input(s): PROBNP in the last 8760 hours. HbA1C: No results for input(s): HGBA1C in the last 72 hours. CBG: No results for input(s): GLUCAP in the last 168 hours. Lipid Profile: No results for input(s): CHOL, HDL, LDLCALC, TRIG, CHOLHDL, LDLDIRECT in the last 72 hours. Thyroid Function Tests: No results for input(s): TSH, T4TOTAL, FREET4, T3FREE, THYROIDAB in the last 72 hours. Anemia Panel: No results for input(s): VITAMINB12, FOLATE, FERRITIN, TIBC, IRON, RETICCTPCT in the last 72 hours. Sepsis Labs: Recent Labs  Lab 12/06/17 1245  LATICACIDVEN 1.74    Recent Results (from  the past 240 hour(s))  Surgical pcr screen     Status: None   Collection Time: 12/12/17  5:42 AM  Result Value Ref Range Status   MRSA, PCR NEGATIVE NEGATIVE Final   Staphylococcus aureus NEGATIVE NEGATIVE Final    Comment: (NOTE) The Xpert SA Assay (FDA approved for NASAL specimens in patients 81 years of age and older), is one component of a comprehensive surveillance program. It is not intended to diagnose infection nor to guide or monitor treatment. Performed at [facility], [address] [place], [place] [address]          Radiology Studies: No results found.      Scheduled Meds: . aspirin EC  81 mg Oral Daily  . atorvastatin  40 mg Oral Daily  . carvedilol  3.125 mg Oral BID WC  . clopidogrel  75 mg Oral Q breakfast  . collagenase   Topical Daily  . DULoxetine  30 mg Oral Daily  . enoxaparin (LOVENOX) injection  40 mg Subcutaneous Daily  . feeding supplement (ENSURE ENLIVE)  237 mL Oral BID BM  . furosemide  40 mg Intravenous Daily  . Gerhardt's butt cream   Topical TID  . hydrocerin   Topical BID  . levothyroxine  100 mcg Oral QAC breakfast  . multivitamin with minerals  1 tablet Oral Daily  . sodium chloride flush  3 mL Intravenous Q12H  . spironolactone  25 mg Oral Daily  . tamsulosin  0.4 mg Oral QPC breakfast   Continuous Infusions: . sodium chloride    . lactated ringers Stopped (12/12/17 1809)  . piperacillin-tazobactam (ZOSYN)  IV 3.375 g (12/13/17 0924)     LOS: 7 days    I have spent 30 minutes face to face with the patient and on the ward discussing the patients care, assessment, plan and disposition with other care givers. >50% of the time was devoted counseling the patient about the risks and benefits of treatment and coordinating care.     Ankit Joline Maxcy, MD [facility] Pager [phone]   If 7PM-7AM, please contact night-coverage [email or web address] Password [email or web address] 12/13/2017, 11:07 AM

## 2017-12-13 NOTE — Consult Note (Addendum)
WOC Nurse wound consult note Reason for Consult: Requested to assist Dr Lajoyce Cornersuda with compression stocking application.  Dr Lajoyce Cornersuda discussed the process earlier with the patient and discussed plan of care.  Pt has patchy areas of full thickness wounds to left anterior leg and large amt yellow drainage, no odor, and dry eschar to toes. Refer to previous ortho consult note for wound assessment.   Dressing procedure/placement/frequency: Leave compression sock in place to bilat lega.  Change socks PRN daily if soiled; pt has extra socks at the bedside.  DO NOT THROW AWAY, socks can be washed and changed PRN. DO NOT APPLY ANY DRESSINGS UNDERNEATH socks.  Socks should be applied in the following manner if they are changed:  Turn sock inside out.  Line up the heel and gradually stretch the sock over the leg until it is right side out, making sure there are no wrinkles.  Please refer to Dr Lajoyce Cornersuda for further questions. Please re-consult if further assistance is needed.  Thank-you,  Cammie Mcgeeawn Jeret Goyer MSN, RN, CWOCN, [place] LakesWCN-AP, CNS [phone]

## 2017-12-13 NOTE — Telephone Encounter (Signed)
sch appt vm NA 01/03/18 4pm LE Venous reflux 01/08/18 11am ABI 1145am p/o +MD s/p

## 2017-12-13 NOTE — Progress Notes (Addendum)
   Daily Progress Note   Assessment/Planning:   POD #1 s/p L SFA subintimal angioplasty and stenting for SFA occlusion, BLE CVI   L leg is completely revascularized at this point  Dr. Lajoyce Cornersuda should be able to continue with his expected L TMA.  No signs of R groin bleeding.  H/H stable.  Unclear etiology of this patient's hypotension.  I would try to check BP in various limbs to see if pt has SCA stenosis.  Would keep pt on ASA+Plavix until the P2Y12 platelet inhibition lab comes back.  If pt is a Plavix non-responder, ok to drop Plavix and continue ASA.  Otherwise, drop the ASA and continue Plavix  Pt needs B unna boots for treatment of his CVI but he may not tolerate such.  He would probably do better with a wound care clinic as an outpatient for weekly compression dressings.  Patient can follow up with me in 4 weeks.  Will do BLE venous reflux duplex at that time   Subjective  - 1 Day Post-Op   No complaints   Objective   Vitals:   12/12/17 2045 12/12/17 2100 12/12/17 2147 12/13/17 0451  BP: (!) 90/58 (!) 93/58 (!) 89/71 95/61  Pulse: (!) 108 (!) 109 (!) 119 (!) 106  Resp: 20 19 18 17   Temp:  97.9 F (36.6 C) (!) 97.1 F (36.2 C) 97.9 F (36.6 C)  TempSrc:  Oral Oral Oral  SpO2: 98% 99% 96% 98%  Weight:   200 lb 2.8 oz (90.8 kg) 198 lb 6.6 oz (90 kg)  Height:   5\' 10"  (1.778 m)      Intake/Output Summary (Last 24 hours) at 12/13/2017 0717 Last data filed at 12/12/2017 2259 Gross per 24 hour  Intake 410 ml  Output 610 ml  Net -200 ml   R groin: no hematoma, mild echymosis  VASC: both legs bandaged, viable appearing R foot, L foot gangrene as previous   Laboratory   CBC CBC Latest Ref Rng & Units 12/12/2017 12/11/2017 12/10/2017  WBC 4.0 - 10.5 K/uL 12.1(H) 11.5(H) 10.9(H)  Hemoglobin 13.0 - 17.0 g/dL 12.1(L) 11.4(L) 11.4(L)  Hematocrit 39.0 - 52.0 % 36.4(L) 34.8(L) 35.0(L)  Platelets 150 - 400 K/uL 211 202 203    BMET    Component Value Date/Time   NA  134 (L) 12/12/2017 0505   K 3.9 12/12/2017 0505   CL 100 (L) 12/12/2017 0505   CO2 28 12/12/2017 0505   GLUCOSE 99 12/12/2017 0505   BUN 28 (H) 12/12/2017 0505   CREATININE 0.98 12/12/2017 0505   CALCIUM 8.6 (L) 12/12/2017 0505   GFRNONAA >60 12/12/2017 0505   GFRAA >60 12/12/2017 0505     Leonides SakeBrian Albertina Leise, MD, FACS [facility] Office: [phone] Pager: [phone]  12/13/2017, 7:17 AM

## 2017-12-13 NOTE — Progress Notes (Signed)
[facility]  Date of Admission:  12/06/2017   Total days of antibiotics 8        Zosyn 5/28-                                                                                     Cefepime 5/25-5/28                                                                                     Vanc/Aztreonam 5/24  ASSESSMENT: Peter Cochran is a 62 y.o. male with history of CHF, PAD, chronic venous ulcers x 4 years, HTN, and hypothyroidism who presented on 5/24 with sepsis secondary to bilateral lower extremity cellulitis and gangrene of L 1st and 2nd toes. Wound cx from OSH with ciprofloxacin-resistant Pseudomonas. Initially treated with vancomycin, clindamycin, and aztreonam at OSH and now on Zosyn for Pseudomonas and anaerobic coverage. He is POD #1 s/p L SFA angioplasty and stenting for SFA occlusion. Currently tachycardic and mildly hypotensive (BP improving) but states overall feeling well and requesting to go home.      PLAN: 1. Bilateral lower extremities cellulitis: Continue Zosyn for pseudomonal and anaerobic coverage. Recommend 4 weeks after vascular intervention. PICC line ordered.  2. PAD/chronic venous stasis ulcers/dry gangrene:  POD # 1s/p L SFA angioplasty and stenting. Plan for L transmetatarsal amputation by Dr. Lajoyce Corners, date to be determined. Wound care following.   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 aspirin EC  81 mg Oral Daily  . atorvastatin  40 mg Oral Daily  . carvedilol  3.125 mg Oral BID WC  . clopidogrel  75 mg Oral Q breakfast  . collagenase   Topical Daily  . DULoxetine  30 mg Oral Daily  . enoxaparin (LOVENOX) injection  40 mg Subcutaneous Daily  . feeding supplement (ENSURE ENLIVE)  237 mL Oral BID BM    . furosemide  40 mg Intravenous Daily  . Gerhardt's butt cream   Topical TID  . hydrocerin   Topical BID  . levothyroxine  100 mcg Oral QAC breakfast  . multivitamin with minerals  1 tablet Oral Daily  . sodium chloride flush  3 mL Intravenous Q12H  . spironolactone  25 mg Oral Daily  . tamsulosin  0.4 mg Oral QPC breakfast   Continuous Infusions: . sodium chloride    . lactated ringers Stopped (12/12/17 1809)  . piperacillin-tazobactam (ZOSYN)  IV 3.375 g (12/13/17 0924)   PRN Meds:.sodium chloride, bisacodyl, hydrALAZINE, HYDROcodone-acetaminophen, labetalol, magnesium citrate, ondansetron **OR** ondansetron (  ZOFRAN) IV, oxyCODONE, senna-docusate, sodium chloride flush   SUBJECTIVE: Patient was afebrile, tachycardic and hypotensive overnight. This AM HR 100s and BP improving, now 107/73. He reports doing well and has no complaints this morning. Denies fever, chills, chest pain, palpitations, SOB, diarrhea. Reports good appetite and no difficulty sleeping. Was able to work with PT this AM. States he would like to go home today and have surgery as an outpatient.   Review of Systems: Review of Systems  Constitutional: Negative for chills, fever and malaise/fatigue.  Respiratory: Negative for cough and shortness of breath.   Cardiovascular: Positive for leg swelling. Negative for chest pain and palpitations.  Gastrointestinal: Negative for abdominal pain, diarrhea, nausea and vomiting.    Allergies  Allergen Reactions  . Oxytetracycline Itching and Swelling    Facial swelling  . Sulfa Antibiotics Rash    OBJECTIVE: Vitals:   12/12/17 2045 12/12/17 2100 12/12/17 2147 12/13/17 0451  BP: (!) 90/58 (!) 93/58 (!) 89/71 95/61  Pulse: (!) 108 (!) 109 (!) 119 (!) 106  Resp: Temp:  97.9 F (36.6 C) (!) 97.1 F (36.2 C) 97.9 F (36.6 C)  TempSrc:  Oral Oral Oral  SpO2: 98% 99% 96% 98%  Weight:   200 lb 2.8 oz (90.8 kg) 198 lb 6.6 oz (90 kg)  Height:    (1.778  m)    Body mass index is 28.47 kg/m.  Physical Exam  Constitutional:  Elderly male sitting by side of bed in no acute distress. He is well-nourished and well-developed.    Cardiovascular:  Tachycardic, nl S1/S2, no mrg   Pulmonary/Chest:  CTAB with normal work of breathing   Musculoskeletal:  Bilateral lower extremity edema. LLE bandaged with dressing c/d/i. L 1st and 2nd toes gangrenous, unchanged from yesterday.   Neurological:  Alert and oriented. Difficulty ambulating in the setting of lower extremity wounds.     Lab Results Lab Results  Component Value Date   WBC 12.1 (H) 12/12/2017   HGB 12.1 (L) 12/12/2017   HCT 36.4 (L) 12/12/2017   MCV 94.8 12/12/2017   PLT 211 12/12/2017    Lab Results  Component Value Date   CREATININE 0.98 12/12/2017   BUN 28 (H) 12/12/2017   NA 134 (L) 12/12/2017   K 3.9 12/12/2017   CL 100 (L) 12/12/2017   CO2 28 12/12/2017    Lab Results  Component Value Date   ALT 18 12/07/2017   AST 22 12/07/2017   ALKPHOS 63 12/07/2017   BILITOT 0.6 12/07/2017     Microbiology: Recent Results (from the past 240 hour(s))  Surgical pcr screen     Status: None   Collection Time: 12/12/17  5:42 AM  Result Value Ref Range Status   MRSA, PCR NEGATIVE NEGATIVE Final   Staphylococcus aureus NEGATIVE NEGATIVE Final    Comment: (NOTE) The Xpert SA Assay (FDA approved for NASAL specimens in patients 26 years of age and older), is one component of a comprehensive surveillance program. It is not intended to diagnose infection nor to guide or monitor treatment. Performed at [facility], [address], [place], [place] [address]     Burna Cash, MD [facility] [phone] pager   [phone] cell 12/13/2017, 10:35 AM

## 2017-12-13 NOTE — Progress Notes (Signed)
[facility] will follow pt for home IV ABX at DC if ordered.  [facility] can also support HH services as ordered for home.  If patient discharges after hours, please call [phone].   Sedalia Mutaamela S Chandler 12/13/2017, 12:40 PM

## 2017-12-13 NOTE — Progress Notes (Signed)
Physical Therapy Treatment Patient Details Name: Peter Cochran MRN: [id number] DOB: 03/08/1956 Today's Date: 12/13/2017    History of Present Illness Pt. is a 62 y.o. M with significant PMH of CHF and PAD who presents for evaluation of wound infection.  Pt with gangrene of lt toes. Underwent revascularization of LLE on 5/30. Pt likely to have transmet amputation on lt in the near future    PT Comments    Pt making steady progress. Pt has support and home and feels his setup at home allows him to do more. Pt will go home prior to any amputation of lt toes/foot.    Follow Up Recommendations  Home health PT;Supervision/Assistance - 24 hour     Equipment Recommendations  None recommended by PT    Recommendations for Other Services       Precautions / Restrictions Precautions Precautions: Fall Restrictions Weight Bearing Restrictions: No    Mobility  Bed Mobility               General bed mobility comments: Pt sitting EOB  Transfers Overall transfer level: Needs assistance Equipment used: Rolling walker (2 wheeled) Transfers: Sit to/from Stand Sit to Stand: +2 physical assistance;Min assist         General transfer comment: Assist to stabilize walker and bring hips up. Pt stood x 2 for 4 and 2 minutes respectively. Cues to stand more erect.  Ambulation/Gait                 Stairs             Wheelchair Mobility    Modified Rankin (Stroke Patients Only)       Balance Overall balance assessment: Needs assistance Sitting-balance support: Feet supported;No upper extremity supported Sitting balance-Leahy Scale: Good     Standing balance support: Bilateral upper extremity supported Standing balance-Leahy Scale: Poor Standing balance comment: walker and min guard assist                            Cognition Arousal/Alertness: Awake/alert Behavior During Therapy: WFL for tasks assessed/performed Overall Cognitive Status: Within  Functional Limits for tasks assessed                                        Exercises      General Comments        Pertinent Vitals/Pain Pain Assessment: Faces Faces Pain Scale: Hurts even more Pain Location: BLE Pain Descriptors / Indicators: Burning Pain Intervention(s): Limited activity within patient's tolerance;Monitored during session    Home Living                      Prior Function            PT Goals (current goals can now be found in the care plan section) Progress towards PT goals: Progressing toward goals    Frequency    Min 2X/week      PT Plan Discharge plan needs to be updated    Co-evaluation              AM-PAC PT "6 Clicks" Daily Activity  Outcome Measure  Difficulty turning over in bed (including adjusting bedclothes, sheets and blankets)?: A Lot Difficulty moving from lying on back to sitting on the side of the bed? : Unable Difficulty sitting down on and standing up  from a chair with arms (e.g., wheelchair, bedside commode, etc,.)?: Unable Help needed moving to and from a bed to chair (including a wheelchair)?: Total Help needed walking in hospital room?: Total Help needed climbing 3-5 steps with a railing? : Total 6 Click Score: 7    End of Session Equipment Utilized During Treatment: Gait belt Activity Tolerance: Patient tolerated treatment well Patient left: in bed(sitting EOB) Nurse Communication: Mobility status PT Visit Diagnosis: Other abnormalities of gait and mobility (R26.89);Muscle weakness (generalized) (M62.81);History of falling (Z91.81);Pain Pain - Right/Left: Left Pain - part of body: Leg     Time: 6045-4098 PT Time Calculation (min) (ACUTE ONLY): 17 min  Charges:  $Therapeutic Activity: 8-22 mins                    G Codes:       [facility] PT [phone]    Angelina Ok [facility] 12/13/2017, 1:33 PM

## 2017-12-13 NOTE — Care Management Note (Signed)
Case Management Note  Patient Details  Name: Peter Cochran MRN: [id number] Date of Birth: 08-16-1955  Subjective/Objective:                 Spoke w patient at bedisde. He states that he lives at home w his wife who works 7p to 7a. He states his nephew stays overnight at their house and will be available to cover his needs at night. He currently has BID W/D dressing changes to sacrum. He states will care for these at home. He also has BLE leg wraps and cellulitis.  He informs me that he will need 4 weeks of IV Abx at home and is to get a PICC line placed possibly today. He also states that the plan is to toes on L foot amputated. This is supported in MD notes.  He states he was going to wound care center prior to admission. He is active w [facility] for RN PT OT. Spoke with them today about IV Abx needs. They state that as of now they should be able to support that.  Notified Jeri Modena with [facility] IV home infusion liaison to follow for DC needs.    Action/Plan:  Will need OPAT order for home IV Abx. Will need orders sent to [facility] and [facility] date confirmed.  Expected Discharge Date:                  Expected Discharge Plan:  Home w Home Health Services  In-House Referral:     Discharge planning Services  CM Consult  Post Acute Care Choice:  Home Health Choice offered to:  Patient  DME Arranged:  IV pump/equipment DME Agency:  [facility]  HH Arranged:  RN, PT, OT [facility] Agency:  [facility]  Status of Service:  In process, will continue to follow  If discussed at Long Length of Stay Meetings, dates discussed:    Additional Comments:  Lawerance Sabal, RN 12/13/2017, 10:22 AM

## 2017-12-13 NOTE — Progress Notes (Signed)
PHARMACY CONSULT NOTE FOR:  OUTPATIENT  PARENTERAL ANTIBIOTIC THERAPY (OPAT)  Indication: Cellulitis Regimen:  Zosyn 3.375 g Q8hrs End date: 01/10/2018  IV antibiotic discharge orders are pended. To discharging provider:  please sign these orders via discharge navigator,  Select New Orders & click on the button choice - Manage This Unsigned Work.    Thank you for allowing pharmacy to be a part of this patient's care.  Nolen Mu PharmD PGY1 Pharmacy Practice Resident 12/13/2017 1:14 PM Phone: [phone]

## 2017-12-14 ENCOUNTER — Inpatient Hospital Stay: Payer: Self-pay

## 2017-12-14 ENCOUNTER — Other Ambulatory Visit: Payer: PRIVATE HEALTH INSURANCE

## 2017-12-14 DIAGNOSIS — A419 Sepsis, unspecified organism: Principal | ICD-10-CM

## 2017-12-14 DIAGNOSIS — L0390 Cellulitis, unspecified: Secondary | ICD-10-CM

## 2017-12-14 DIAGNOSIS — I5022 Chronic systolic (congestive) heart failure: Secondary | ICD-10-CM

## 2017-12-14 LAB — BASIC METABOLIC PANEL
Anion gap: 6 (ref 5–15)
BUN: 17 mg/dL (ref 6–20)
CHLORIDE: 99 mmol/L — AB (ref 101–111)
CO2: 31 mmol/L (ref 22–32)
CREATININE: 0.91 mg/dL (ref 0.61–1.24)
Calcium: 7.9 mg/dL — ABNORMAL LOW (ref 8.9–10.3)
GFR calc Af Amer: >60 mL/min (ref >60)
GFR calc non Af Amer: >60 mL/min (ref >60)
Glucose, Bld: 96 mg/dL (ref 65–99)
POTASSIUM: 3.9 mmol/L (ref 3.5–5.1)
Sodium: 136 mmol/L (ref 135–145)

## 2017-12-14 LAB — MAGNESIUM: Magnesium: 1.8 mg/dL (ref 1.7–2.4)

## 2017-12-14 LAB — CBC
HCT: 28.6 % — ABNORMAL LOW (ref 39.0–52.0)
Hemoglobin: 9.5 g/dL — ABNORMAL LOW (ref 13.0–17.0)
MCH: 31.4 pg (ref 26.0–34.0)
MCHC: 33.2 g/dL (ref 30.0–36.0)
MCV: 94.4 fL (ref 78.0–100.0)
PLATELETS: 179 10*3/uL (ref 150–400)
RBC: 3.03 MIL/uL — AB (ref 4.22–5.81)
RDW: 15.1 % (ref 11.5–15.5)
WBC: 11.1 10*3/uL — ABNORMAL HIGH (ref 4.0–10.5)

## 2017-12-14 MED ORDER — SODIUM CHLORIDE 0.9% FLUSH: 10.0000 mL | INTRAVENOUS | Status: DC | PRN

## 2017-12-14 MED ORDER — SODIUM CHLORIDE 0.9% FLUSH: 10.0000 mL | Freq: Two times a day (BID) | INTRAVENOUS | Status: DC

## 2017-12-14 NOTE — Progress Notes (Signed)
Patient has been unable to keep legs elevated above heart due to severe throbbing and burning in legs when up.

## 2017-12-14 NOTE — Progress Notes (Signed)
Peripherally Inserted Central Catheter/Midline Placement  The IV Nurse has discussed with the patient and/or persons authorized to consent for the patient, the purpose of this procedure and the potential benefits and risks involved with this procedure.  The benefits include less needle sticks, lab draws from the catheter, and the patient may be discharged home with the catheter. Risks include, but not limited to, infection, bleeding, blood clot (thrombus formation), and puncture of an artery; nerve damage and irregular heartbeat and possibility to perform a PICC exchange if needed/ordered by physician.  Alternatives to this procedure were also discussed.  Bard Power PICC patient education guide, fact sheet on infection prevention and patient information card has been provided to patient /or left at bedside.    PICC/Midline Placement Documentation  PICC Single Lumen 12/14/17 PICC Right Basilic 40 cm 0 cm (Active)  Indication for Insertion or Continuance of Line Home intravenous therapies (PICC only) 12/14/2017  6:14 PM  Exposed Catheter (cm) 0 cm 12/14/2017  6:14 PM  Site Assessment Clean;Dry;Intact 12/14/2017  6:14 PM  Line Status Flushed;Saline locked;Blood return noted 12/14/2017  6:14 PM  Dressing Type Transparent 12/14/2017  6:14 PM  Dressing Status Clean;Dry;Intact;Antimicrobial disc in place 12/14/2017  6:14 PM  Line Care Connections checked and tightened 12/14/2017  6:14 PM  Line Adjustment (NICU/IV Team Only) No 12/14/2017  6:14 PM  Dressing Intervention New dressing 12/14/2017  6:14 PM  Dressing Change Due 12/21/17 12/14/2017  6:14 PM       Elliot Dallyiggs, Kato Wieczorek Wright 12/14/2017, 6:14 PM

## 2017-12-14 NOTE — Progress Notes (Signed)
Patient ID: Sandria ManlyRobert Skalla, male   DOB: [birth year], 62 y.o.   MRN: [id number] Patient is seen in follow-up for gangrene of the toes left foot status post revascularization to the left lower extremity with massive venous stasis swelling and drainage from the left leg.  Compression stockings were applied yesterday and patient has had remarkable improvement in the decreased swelling and decreased drainage.  Patient will need to wear stockings on both legs 24 hours a day and these will need to be changed daily at discharge.  The importance of elevation was discussed.  Patient has 6 socks to take home with him.  Patient may discharge to home when he is safe with therapy.  Patient will need assistance with changing his socks daily.  I will follow-up in the office in 1 week and will schedule elective surgery for toe amputations once the venous condition is under better control.

## 2017-12-14 NOTE — Clinical Social Work Note (Signed)
PT has changed recommendation to HHPT.  CSW signing off. Consult again if any other social work needs arise.  Charlynn CourtSarah Bertine Schlottman, CSW [phone]

## 2017-12-14 NOTE — Progress Notes (Signed)
Patient stated, " When sock comes off it will not go back on due to pain."

## 2017-12-14 NOTE — Progress Notes (Signed)
PROGRESS NOTE  Peter Cochran ZOX:[id number] DOB: August 24, 1955 DOA: 12/06/2017 PCP: Floria Raveling, FNP  HPI/Recap of past 24 hours:  Not a reliable historian, appear to have poor insight of disease process  He c/o left foot pain, no fever  Assessment/Plan: Active Problems:   Chronic ulcer of left lower extremity with fat layer exposed (HCC)   Sepsis due to cellulitis (HCC)   Pressure injury of skin   Bilateral lower leg cellulitis   Gangrene of toe of left foot (HCC)   Malnutrition of moderate degree   Left lower extremity cellulitis with gangrene of left 1,2, 3rd digit; persist Peripheral vascular disease -Angiogram  showed occlusion of SFA therefore angioplasty and stenting was performed on the left side.  Continue his aspirin and Plavix at this time.  -Infectious disease is planning on treating this for total of 4 weeks with Zosyn due to previous history of pseudomonal infection.  PICC line placed - orthopedic Dr Lajoyce Corners following, plan for amputation in the near future once edema improved.   Acute kidney injury, resolved -This is resolved, closely monitor his urine output and renal function daily.  Chronic systolic congestive heart failure, ejection fraction 25% -Patient appears to be euvolemic in nature overall but given significant lower extremity swelling we will continue IV Lasix.  Sherryll Burger is on hold, will resume soon. 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  Foley is in place.  -keep foley in due to altered anatomy, outpatient urology follow up   Poor insight   DVT prophylaxis: Lovenox  Consultants:   Vascular   Ortho  ID  Procedures:   Status post left lower extremity angiogram 5/30     Code Status:  full  Family Communication: patient   Disposition Plan: home with home health on 6/2    Antibiotics:  zosyn   Objective: BP 118/72 (BP Location: Left Arm)   Pulse 99   Temp 98.5 F (36.9 C) (Oral)   Resp (!) 28   Ht 5\' 10"  (1.778 m)   Wt 90.2 kg (198 lb 13.7 oz)   SpO2 97%   BMI 28.53 kg/m   Intake/Output Summary (Last 24 hours) at 12/14/2017 1206 Last data filed at 12/14/2017 0900 Gross per 24 hour  Intake 410 ml  Output 2440 ml  Net -2030 ml   Filed Weights   12/12/17 2147 12/13/17 0451 12/14/17 0525  Weight: 90.8 kg (200 lb 2.8 oz) 90 kg (198 lb 6.6 oz) 90.2 kg (198 lb 13.7 oz)    Exam: Patient is examined daily including today on 12/14/2017, exams remain the same as of yesterday except that has changed    General:  NAD  Cardiovascular: RRR  Respiratory: CTABL  Abdomen: Soft/ND/NT, positive BS  Musculoskeletal: left lower extremity Edema/erythema/open skin wound/toe dry gangrene.   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 No results for input(s): LIPASE, AMYLASE in the last 168 hours. No results for input(s): AMMONIA in the last 168 hours. CBC: Recent Labs  Lab 12/10/17 0610 12/11/17 0554 12/12/17 0505 12/13/17 1021 12/14/17 0426  WBC 10.9* 11.5* 12.1* 16.4* 11.1*  HGB 11.4* 11.4* 12.1* 12.2* 9.5*  HCT 35.0* 34.8* 36.4* 37.4* 28.6*  MCV 96.2 95.1 94.8 96.4  94.4  PLT 203 202 211 199 179   Cardiac Enzymes:   No results for input(s): CKTOTAL, CKMB, CKMBINDEX, TROPONINI in the last 168 hours. BNP (last 3 results) Recent Labs    12/08/17 0347  BNP 321.2*    ProBNP (last 3 results) No results for input(s): PROBNP in the last 8760 hours.  CBG: No results for input(s): GLUCAP in the last 168 hours.  Recent Results (from the past 240 hour(s))  Surgical pcr screen     Status: None   Collection Time: 12/12/17  5:42 AM  Result Value Ref Range Status   MRSA, PCR NEGATIVE NEGATIVE Final   Staphylococcus aureus NEGATIVE NEGATIVE Final    Comment: (NOTE) The Xpert SA Assay (FDA approved for NASAL specimens in patients 61 years of age and older), is one component of a comprehensive surveillance program. It is not intended to diagnose infection nor to guide or monitor treatment. Performed at [facility], [address] [place], [place] [address]      Studies: No results found.  Scheduled Meds: . aspirin EC  81 mg Oral Daily  . atorvastatin  40 mg Oral Daily  . carvedilol  3.125 mg Oral BID WC  . clopidogrel  75 mg Oral Q breakfast  . collagenase   Topical Daily  . DULoxetine  30 mg Oral Daily  . enoxaparin (LOVENOX) injection  40 mg Subcutaneous Daily  . feeding supplement (ENSURE ENLIVE)  237 mL Oral BID BM  . furosemide  40 mg Intravenous Daily  . Gerhardt's butt cream   Topical TID  . hydrocerin   Topical BID  . levothyroxine  100 mcg Oral QAC breakfast  . multivitamin with minerals  1 tablet Oral Daily  . sodium chloride flush  3 mL Intravenous Q12H  . spironolactone  25 mg Oral Daily  . tamsulosin  0.4 mg Oral QPC breakfast    Continuous Infusions: . sodium chloride    . lactated ringers Stopped (12/12/17 1809)  . piperacillin-tazobactam (ZOSYN)  IV 3.375 g (12/14/17 0900)     Time spent: 35mins, more than 50% time spent on coordination of care I have personally reviewed and interpreted on  12/14/2017 daily  labs, tele strips, imagings as discussed above under date review session and assessment and plans.  I reviewed all nursing notes, pharmacy notes, consultant notes,  vitals, pertinent old records  I have discussed plan of care as described above with RN , patient  on 12/14/2017   Albertine GratesFang Haze Antillon MD, PhD  [facility] Pager [phone]. If 7PM-7AM, please contact night-coverage at [email or web address], password [facility] 12/14/2017, 12:06 PM  LOS: 8 days

## 2017-12-15 DIAGNOSIS — Z792 Long term (current) use of antibiotics: Secondary | ICD-10-CM

## 2017-12-15 DIAGNOSIS — N179 Acute kidney failure, unspecified: Secondary | ICD-10-CM

## 2017-12-15 LAB — BASIC METABOLIC PANEL
ANION GAP: 7 (ref 5–15)
BUN: 18 mg/dL (ref 6–20)
CHLORIDE: 96 mmol/L — AB (ref 101–111)
CO2: 32 mmol/L (ref 22–32)
Calcium: 8.2 mg/dL — ABNORMAL LOW (ref 8.9–10.3)
Creatinine, Ser: 0.91 mg/dL (ref 0.61–1.24)
GFR calc Af Amer: >60 mL/min (ref >60)
GLUCOSE: 114 mg/dL — AB (ref 65–99)
POTASSIUM: 3.9 mmol/L (ref 3.5–5.1)
Sodium: 135 mmol/L (ref 135–145)

## 2017-12-15 LAB — CBC
HCT: 30.5 % — ABNORMAL LOW (ref 39.0–52.0)
HEMOGLOBIN: 10 g/dL — AB (ref 13.0–17.0)
MCH: 31.3 pg (ref 26.0–34.0)
MCHC: 32.8 g/dL (ref 30.0–36.0)
MCV: 95.6 fL (ref 78.0–100.0)
Platelets: 206 10*3/uL (ref 150–400)
RBC: 3.19 MIL/uL — AB (ref 4.22–5.81)
RDW: 15.1 % (ref 11.5–15.5)
WBC: 10.5 10*3/uL (ref 4.0–10.5)

## 2017-12-15 LAB — MAGNESIUM: MAGNESIUM: 1.8 mg/dL (ref 1.7–2.4)

## 2017-12-15 LAB — CORTISOL: Cortisol, Plasma: 16.6 ug/dL

## 2017-12-15 LAB — TSH: TSH: 7.599 u[IU]/mL — AB (ref 0.350–4.500)

## 2017-12-15 MED ORDER — PIPERACILLIN-TAZOBACTAM IV (FOR PTA / DISCHARGE USE ONLY): 3.3750 g | Freq: Three times a day (TID) | INTRAVENOUS | 0 refills | Status: AC

## 2017-12-15 MED ORDER — COLLAGENASE 250 UNIT/GM EX OINT: TOPICAL_OINTMENT | Freq: Every day | CUTANEOUS | 0 refills | Status: DC

## 2017-12-15 MED ORDER — CLOPIDOGREL BISULFATE 75 MG PO TABS: 75.0000 mg | ORAL_TABLET | Freq: Every day | ORAL | 0 refills | Status: DC

## 2017-12-15 MED ORDER — TAMSULOSIN HCL 0.4 MG PO CAPS: 0.4000 mg | ORAL_CAPSULE | Freq: Every day | ORAL | 0 refills | Status: DC

## 2017-12-15 MED ORDER — SENNOSIDES-DOCUSATE SODIUM 8.6-50 MG PO TABS: 1.0000 | ORAL_TABLET | Freq: Every evening | ORAL | 0 refills | Status: DC | PRN

## 2017-12-15 MED ORDER — ENSURE ENLIVE PO LIQD
237.0000 mL | Freq: Two times a day (BID) | ORAL | 0 refills | Status: DC
Start: 2017-12-15 — End: 2018-01-31

## 2017-12-15 MED ORDER — PIPERACILLIN-TAZOBACTAM IV (FOR PTA / DISCHARGE USE ONLY): 3.3750 g | Freq: Three times a day (TID) | INTRAVENOUS | 0 refills | Status: DC

## 2017-12-15 MED ORDER — ENSURE ENLIVE PO LIQD: 237.0000 mL | Freq: Two times a day (BID) | ORAL | 12 refills | Status: DC

## 2017-12-15 MED ORDER — HYDROCERIN EX CREA: 1.0000 "application " | TOPICAL_CREAM | Freq: Two times a day (BID) | CUTANEOUS | 0 refills | Status: DC

## 2017-12-15 MED ORDER — ADULT MULTIVITAMIN W/MINERALS CH: 1.0000 | ORAL_TABLET | Freq: Every day | ORAL | 0 refills | Status: DC

## 2017-12-15 MED ORDER — HEPARIN SOD (PORK) LOCK FLUSH 100 UNIT/ML IV SOLN
250.0000 [IU] | INTRAVENOUS | Status: AC | PRN
  Administered 2017-12-15: 250 [IU]

## 2017-12-15 MED ORDER — ADULT MULTIVITAMIN W/MINERALS CH
1.0000 | ORAL_TABLET | Freq: Every day | ORAL | 0 refills | Status: DC
Start: 2017-12-15 — End: 2017-12-15

## 2017-12-15 NOTE — Progress Notes (Signed)
Case Manager paged/messaged through [facility]aging system to make [facility] and patient with orders for discharge. Will await call back . Jaleel Allen, Randall AnKristin Jessup RN

## 2017-12-15 NOTE — Care Management (Signed)
[facility].  ON call RN advised patient to d/c and next dose of Zosyn due at 1700.  RN planning to be at patient's home at 1700.  [facility] aware and will deliver medication to patient's home prior to 1700.

## 2017-12-15 NOTE — Progress Notes (Signed)
[facility]    Date of Admission:  12/06/2017   Total days of antibiotics 10           ID: Peter Cochran is a 62 y.o. 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 Still quite painful with changing his therapeutic compression socks  No fever, chills, nightsweats, diarrhea  Medications:  . aspirin EC  81 mg Oral Daily  . atorvastatin  40 mg Oral Daily  . carvedilol  3.125 mg Oral BID WC  . clopidogrel  75 mg Oral Q breakfast  . collagenase   Topical Daily  . DULoxetine  30 mg Oral Daily  . enoxaparin (LOVENOX) injection  40 mg Subcutaneous Daily  . feeding supplement (ENSURE ENLIVE)  237 mL Oral BID BM  . furosemide  40 mg Intravenous Daily  . Gerhardt's butt cream   Topical TID  . hydrocerin   Topical BID  . levothyroxine  100 mcg Oral QAC breakfast  . multivitamin with minerals  1 tablet Oral Daily  . sodium chloride flush  10-40 mL Intracatheter Q12H  . sodium chloride flush  3 mL Intravenous Q12H  . spironolactone  25 mg Oral Daily  . tamsulosin  0.4 mg Oral QPC breakfast    Objective: Vital signs in last 24 hours: Temp:  [98 F (36.7 C)-98.1 F (36.7 C)] 98 F (36.7 C) (06/02 0353) Pulse Rate:  [108-120] 114 (06/02 1020) Resp:  [17-20] 17 (06/02 1020) BP: (85-95)/(59-73) 94/60 (06/02 1020) SpO2:  [97 %-100 %] 100 % (06/02 1020) Weight:  [198 lb 6.6 oz (90 kg)] 198 lb 6.6 oz (90 kg) (06/02 0353) Physical Exam  Constitutional: He is oriented to person, place, and time. He appears well-developed and well-nourished. No distress.  HENT:  Mouth/Throat: Oropharynx is clear and moist. No oropharyngeal exudate.  Cardiovascular: Normal rate, regular rhythm and normal heart sounds. Exam reveals no  gallop and no friction rub.  No murmur heard.  Pulmonary/Chest: Effort normal and breath sounds normal. No respiratory distress. He has no wheezes.  Abdominal: Soft. Bowel sounds are normal. He exhibits no distension. There is no tenderness.  Ext = picc line in place c/d/i; has wool compression socks in place with some moisture at ankles, though edema is much improved Neurological: He is alert and oriented to person, place, and time.  Psychiatric: He has a normal mood and affect. His behavior is normal.    Lab Results Recent Labs    12/14/17 0426 12/15/17 0458  WBC 11.1* 10.5  HGB 9.5* 10.0*  HCT 28.6* 30.5*  NA 136 135  K 3.9 3.9  CL 99* 96*  CO2 31 32  BUN 17 18  CREATININE 0.91 0.91   Lab Results  Component Value Date   ESRSEDRATE 32 (H) 12/07/2017    Microbiology: Not mrsa colonized Studies/Results: [place] Ekg [facility]  Result Date: 12/14/2017 If Site [facility] not attached, placement could not be confirmed due to current cardiac rhythm.   Assessment/Plan: Chronic non-healing wounds in setting of PAD/PVD s/p angioplasty and stenting to left leg= hx of PsA wound colonization. Recommended to treat for 4 wk  Gangrenous toes = planning for upcoming TMA in  the coming weeks.bridged with abtx til his amputation  Will see back in clinic in 3-4 wk  [facility] Cell: [phone] Pager: [phone]  12/15/2017, 3:47 PM

## 2017-12-15 NOTE — Progress Notes (Signed)
Patient and family given discharge instructions medication list and paper prescriptions. All questions were answered. Patient to go home with foley and PIcc line. Patient transported to exit via wheel chair and nursing staff. Patient assisted in car with nursing staff and family. Family aware that [facility] care this afternoon for next antibiotic dose. [facility] and patient spouse regarding antibiotic and caregivers will be there when home health RN to arrive this afternoon. Will discharge home as ordered. Willoughby Doell, Randall AnKristin Jessup RN

## 2017-12-15 NOTE — Discharge Summary (Addendum)
Discharge Summary  Peter Cochran ZOX:[id number] DOB: [birth year]  PCP: Peter Core, FNP  Admit date: 12/06/2017 Discharge date: 12/15/2017  Time spent: 18mns , more than 50% time spent on coordination of care  Recommendations for Outpatient Follow-up:  1. F/u with PMD within a week  for hospital discharge follow up, repeat cbc/bmp at follow up. 2. F/u with orhtopedic Peter DSharol Givenin one week 3. F/u with vascular surgery Peter CBridgett Larssonin 4weeks 4. F/u with urology for foley management 5. F/u with cardiology Peter MBettina Gaviain two weeks for heart failure management 6. F/u with infectious disease in 3-4 weeks 7. Patient declined snf, maximize home health  Discharge Diagnoses:  Active Hospital Problems   Diagnosis Date Noted  . Malnutrition of moderate degree 12/12/2017  . Bilateral lower leg cellulitis   . Gangrene of toe of left foot (Peter Cochran   . Pressure injury of skin 12/07/2017  . Sepsis due to cellulitis ([place] 12/06/2017  . 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    Discharge Condition: stable  Diet recommendation: heart healthy  Filed Weights   12/13/17 0451 12/14/17 0525 12/15/17 0353  Weight: 90 kg (198 lb 6.6 oz) 90.2 kg (198 lb 13.7 oz) 90 kg (198 lb 6.6 oz)    History of present illness: (per admitting MD Peter Cochran HPI: Peter Brothertonis a 62y.o. male with a known history of CHF, PAD,  presents to the emergency department for evaluation of wound infection.  [facility]) but left AMA this evening [facility]  The plan was to transport him by ambulance for a wound/vascular consult but he could not tolerated being transported with his legs elevated.   He reports worsening of his bilateral lower extremity edema, worsening left 1st and 2nd toes which have become black. He has been  under the care of wound care.   He complains of severe pain to bilateral lower extremities which is worse with movement.   From EDP note: Records obtained show recent ABI of 1.7 on the right and 0.74 on the left. Patient has been having upward trending creatinine. Most recent labs show WBC 16.3, hemoglobin 12.1, creatinine of 0.8 ->1.3 ->1.9. Patient was being treated with aztreonam, clindamycin, and vancomycin. He had imaging of the right and left tib-fib which were negative.  Patient denies fevers/chills, weakness, dizziness, chest pain, shortness of breath, N/V/C/D, abdominal pain, dysuria/frequency, changes in mental status.    Otherwise there has been no change in status.   EMS/ED Course: Patient received azactam, vanco, clinda, NS. Medical admission has been requested for further management of bilateral lower extremity cellulitis, gangrene of the toe of the left foot, AKI.    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 Continue his aspirin and Plavix at this time.he is to follow up with vascular surgery Peter Cochran-Infectious disease recommended treat for total  of 4 weeks with Zosyn due to previous history of pseudomonal infection. PICC line placed -orthopedic Peter Cochran following, plan for amputation in the near future once edema improves.   Acute kidney injury, resolved -This is resolved, closely monitor his urine output and renal function daily.  Chronic systolic congestive heart failure, ejection fraction 25% -Patient appears to be  euvolemic in nature overall but Cochran significant lower extremity swelling, he received  IV Lasix in the hospital. Peter Cochran is on held in the hospital due to borderline bp, resumed at discharge at home dose, continue low dose coreg, oral lasix and spironolactone. 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 Foley is in place.  -keep foley in due to altered anatomy, outpatient urology follow up   Patient appear to have Poor insight regarding his medical problems vs denial.   DVT prophylaxis:Lovenox  Consultants:  Vascular   Ortho  ID  Procedures:  Status post left lower extremity angiogram 5/30     Code Status: full  Family Communication: patient   Disposition Plan: home with home health on 6/2    Antibiotics:  zosyn   Discharge Exam: BP 95/62 (BP Location: Left Arm)   Pulse (!) 108   Temp 98 F (36.7 C) (Oral)   Resp 17   Ht '5\' 10"'  (1.778 m)   Wt 90 kg (198 lb 6.6 oz)   SpO2 97%   BMI 28.47 kg/m     General:  NAD  Cardiovascular: RRR  Respiratory: CTABL  Abdomen: Soft/ND/NT, positive BS  Musculoskeletal: left lower extremity Edema/erythema/open skin wound/toe dry gangrene.   Neuro: alert, oriented , poor historian  Scrotal altered anatomy, appear to have large scrotal hernia, he reports it is chronic, denies pain   Discharge Instructions You were cared for by a hospitalist during your hospital stay. If you have any questions about your discharge medications or the care you received while you were in the hospital after  you are discharged, you can call the unit and asked to speak with the hospitalist on call if the hospitalist that took care of you is not available. Once you are discharged, your primary care physician will handle any further medical issues. Please note that NO REFILLS for any discharge medications will be authorized once you are discharged, as it is imperative that you return to your primary care physician (or establish a relationship with a primary care physician if you do not have one) for your aftercare needs so that they can reassess your need for medications and monitor your lab values.  Discharge Instructions    Change dressing   Complete by:  As directed    Patient to wear the black medical compression stockings 24 hours a day.  These need to be changed daily.  The sock that is removed needs to go in the washing machine on warm water and then in the dryer on low heat for 20 minutes.   Diet - low sodium heart healthy   Complete by:  As directed    Home infusion instructions [facility] May follow [place] Dosing Protocol; May administer Cathflo as needed to maintain patency of vascular  access device.; Flushing of vascular access device: per [facility] Protocol: 0.9% NaCl pre/post medica...   Complete by:  As directed    Instructions:  May follow [place] Dosing Protocol   Instructions:  May administer Cathflo as needed to maintain patency of vascular access device.   Instructions:  Flushing of vascular access device: per [facility] Protocol: 0.9% NaCl pre/post medication administration and prn patency; Heparin 100 u/ml, 36m for implanted ports and Heparin 10u/ml, 544mfor all other central venous catheters.   Instructions:  May follow [facility] Anaphylaxis Protocol for First Dose Administration in the home: 0.9% NaCl at 25-50 ml/hr to maintain IV access for protocol meds. Epinephrine 0.3 ml IV/IM PRN and Benadryl 25-50 IV/IM PRN s/s of anaphylaxis.   Instructions:  AdFort Davisnfusion  Coordinator (RN) to assist per patient IV care needs in the home PRN.   Home infusion instructions [facility] May follow [place] Protocol; May administer Cathflo as needed to maintain patency of vascular access device.; Flushing of vascular access device: per [facility]: 0.9% NaCl pre/post medica...   Complete by:  As directed    Instructions:  May follow AC[place]osing Protocol   Instructions:  May administer Cathflo as needed to maintain patency of vascular access device.   Instructions:  Flushing of vascular access device: per [facility]: 0.9% NaCl pre/post medication administration and prn patency; Heparin 100 u/ml, 74m54mor implanted ports and Heparin 10u/ml, 74ml18mr all other central venous catheters.   Instructions:  May follow [facility] Anaphylaxis Protocol for First Dose Administration in the home: 0.9% NaCl at 25-50 ml/hr to maintain IV access for protocol meds. Epinephrine 0.3 ml IV/IM PRN and Benadryl 25-50 IV/IM PRN s/s of anaphylaxis.   Instructions:  Adva[place]usion Coordinator (RN) to assist per patient IV care needs in the home PRN.   Increase activity slowly   Complete by:  As directed      Allergies as of 12/15/2017      Reactions   Oxytetracycline Itching, Swelling   Facial swelling   Sulfa Antibiotics Rash      Medication List    STOP taking these medications   Vitamin D (Ergocalciferol) 50000 units Caps capsule Commonly known as:  DRISDOL     TAKE these medications   aspirin EC 81 MG tablet Take 81 mg by mouth daily.   atorvastatin 40 MG tablet Commonly known as:  LIPITOR Take 1 tablet (40 mg total) by mouth daily.   carvedilol 3.125 MG tablet Commonly known as:  COREG Take 1 tablet (3.125 mg total) by mouth 2 (two) times daily with a meal.   clopidogrel 75 MG tablet Commonly known as:  PLAVIX Take 1 tablet (75 mg total) by mouth daily with breakfast.   collagenase ointment Commonly known as:  SANTYL Apply topically  daily.   DULoxetine 30 MG capsule Commonly known as:  CYMBALTA Take 30 mg by mouth daily.   feeding supplement (ENSURE ENLIVE) Liqd Take 237 mLs by mouth 2 (two) times daily between meals.   furosemide 20 MG tablet Commonly known as:  LASIX Take 1 tablet (20 mg total) by mouth daily.   hydrocerin Crea Apply 1 application topically 2 (two) times daily.   HYDROcodone-acetaminophen 5-325 MG tablet Commonly known as:  NORCO/VICODIN Take 1 tablet by mouth every 4 (four) hours as needed for pain.   levothyroxine 100 MCG tablet Commonly known as:  SYNTHROID, LEVOTHROID Take 100 mcg by mouth daily before breakfast.  multivitamin with minerals Tabs tablet Take 1 tablet by mouth daily.   piperacillin-tazobactam IVPB Commonly known as:  ZOSYN Inject 3.375 g into the vein every 8 (eight) hours for 26 days. Indication:  Cellulitis (Pseudomonas) Last Day of Therapy:  01/10/18 Labs - Once weekly:  CBC/D and BMP, Labs - Every other week:  ESR and CRP   sacubitril-valsartan 24-26 MG Commonly known as:  ENTRESTO Take 1 tablet by mouth 3 (three) times a week.   senna-docusate 8.6-50 MG tablet Commonly known as:  Senokot-S Take 1 tablet by mouth at bedtime as needed for mild constipation.   spironolactone 25 MG tablet Commonly known as:  ALDACTONE Take 1 tablet (25 mg total) by mouth daily.   tamsulosin 0.4 MG Caps capsule Commonly known as:  FLOMAX Take 1 capsule (0.4 mg total) by mouth daily after breakfast.            Home Infusion Instuctions  (From admission, onward)        Start     Ordered   12/15/17 0000  Home infusion instructions [facility] May follow [place] Dosing Protocol; May administer Cathflo as needed to maintain patency of vascular access device.; Flushing of vascular access device: per [facility] Protocol: 0.9% NaCl pre/post medica...    Question Answer Comment  Instructions May follow [place] Dosing Protocol   Instructions May administer  Cathflo as needed to maintain patency of vascular access device.   Instructions Flushing of vascular access device: per [facility] Protocol: 0.9% NaCl pre/post medication administration and prn patency; Heparin 100 u/ml, 49m for implanted ports and Heparin 10u/ml, 578mfor all other central venous catheters.   Instructions May follow [facility] Anaphylaxis Protocol for First Dose Administration in the home: 0.9% NaCl at 25-50 ml/hr to maintain IV access for protocol meds. Epinephrine 0.3 ml IV/IM PRN and Benadryl 25-50 IV/IM PRN s/s of anaphylaxis.   Instructions [facility] Infusion Coordinator (RN) to assist per patient IV care needs in the home PRN.      12/15/17 0818   12/13/17 0000  Home infusion instructions [facility] May follow [place]osing Protocol; May administer Cathflo as needed to maintain patency of vascular access device.; Flushing of vascular access device: per [facility]: 0.9% NaCl pre/post medica...    Question Answer Comment  Instructions May follow ACSanta Isabelosing Protocol   Instructions May administer Cathflo as needed to maintain patency of vascular access device.   Instructions Flushing of vascular access device: per [facility]ocol: 0.9% NaCl pre/post medication administration and prn patency; Heparin 100 u/ml, 82m60mor implanted ports and Heparin 10u/ml, 82ml80mr all other central venous catheters.   Instructions May follow [facility] Anaphylaxis Protocol for First Dose Administration in the home: 0.9% NaCl at 25-50 ml/hr to maintain IV access for protocol meds. Epinephrine 0.3 ml IV/IM PRN and Benadryl 25-50 IV/IM PRN s/s of anaphylaxis.   Instructions [facility] Infusion Coordinator (RN) to assist per patient IV care needs in the home PRN.      12/13/17 1312       Discharge Care Instructions  (From admission, onward)        Start     Ordered   12/14/17 0000  Change dressing    Comments:  Patient to wear the black medical compression stockings 24 hours a  day.  These need to be changed daily.  The sock that is removed needs to go in the washing machine on warm water and then  in the dryer on low heat for 20 minutes.   12/14/17 1141     Allergies  Allergen Reactions  . Oxytetracycline Itching and Swelling    Facial swelling  . Sulfa Antibiotics Rash   Follow-up Information    Newt Minion, MD Follow up in 1 week(s).   Specialty:  Orthopedic Surgery Contact information: [place] [address] [phone]        Conrad Apalachicola, MD Follow up in 4 week(s).   Specialties:  Vascular Surgery, Cardiology Contact information: [place] [address] [phone]        Peter Core, FNP Follow up in 1 week(s).   Specialty:  Family Medicine Why:  discharge follow up, repeat cbc/bmp at follow up. pcp to monitor thyroid function (tsh) Contact information: [address] [place] [address] [phone]        Richardo Priest, MD Follow up in 2 week(s).   Specialty:  Cardiology Why:  for heart failure Contact information: [place] [address] [phone]        Clydene Fake, MD Follow up in 1 week(s).   Specialty:  Urology Why:  for urinary retention, bilateral hydronephrosis, foley management Contact information: [place]. [place] [address] [phone]            The results of significant diagnostics from this hospitalization (including imaging, microbiology, ancillary and laboratory) are listed below for reference.    Significant Diagnostic Studies: US Renal  Result Date: 12/08/2017 CLINICAL DATA:  Acute kidney injury. EXAM: RENAL / URINARY TRACT ULTRASOUND COMPLETE COMPARISON:  None. FINDINGS: Right Kidney: Length: 11.3 cm. Echogenicity within normal limits. Mild hydronephrosis. No mass. Left Kidney: Length: 11.8 cm. Echogenicity within normal limits. Mild hydronephrosis. No mass. Bladder: Appears normal for degree of  bladder distention. IMPRESSION: 1. Mild bilateral hydronephrosis. Electronically Signed   By: Kerby Moors M.D.   On: 12/08/2017 09:30   Dg Foot Complete Left  Result Date: 12/06/2017 CLINICAL DATA:  Legs are full of fluid EXAM: LEFT FOOT - COMPLETE 3+ VIEW COMPARISON:  10/19/2017 FINDINGS: No fracture or malalignment. Mild degenerative change at the first MTP joint. No periostitis or bone destruction. No soft tissue gas IMPRESSION: No acute osseous abnormality Electronically Signed   By: Donavan Foil M.D.   On: 12/06/2017 20:21   Dg Foot Complete Right  Result Date: 12/06/2017 CLINICAL DATA:  Legs are full of fluid EXAM: RIGHT FOOT COMPLETE - 3+ VIEW COMPARISON:  12/02/2017 FINDINGS: No fracture or malalignment. Small plantar calcaneal spur. Soft tissue edema. No soft tissue gas or foreign body IMPRESSION: No acute osseous abnormality Electronically Signed   By: Donavan Foil M.D.   On: 12/06/2017 20:22   [place] Ekg Site Rite  Result Date: 12/14/2017 If Site Rite image not attached, placement could not be confirmed due to current cardiac rhythm.   Microbiology: Recent Results (from the past 240 hour(s))  Surgical pcr screen     Status: None   Collection Time: 12/12/17  5:42 AM  Result Value Ref Range Status   MRSA, PCR NEGATIVE NEGATIVE Final   Staphylococcus aureus NEGATIVE NEGATIVE Final    Comment: (NOTE) The Xpert SA Assay (FDA approved for NASAL specimens in patients 46 years of age and older), is one component of a comprehensive surveillance program. It is not intended to diagnose infection nor to guide or monitor treatment. Performed at [facility], [address] [place], [place] [address]      Labs: Basic Metabolic Panel: Recent Labs  Lab 12/11/17 0554 12/12/17 0505 12/13/17 1021 12/14/17 0426 12/15/17 0458  NA 136 134* 134* 136 135  K 4.2 3.9 4.8 3.9 3.9  CL 98* 100* 100* 99* 96*  CO2 '28 28 23 31 ' 32  GLUCOSE 102* 99 149* 96 114*  BUN 27* 28* 23* 17 18    CREATININE 1.02 0.98 1.00 0.91 0.91  CALCIUM 8.6* 8.6* 8.3* 7.9* 8.2*  MG  --  1.6* 1.8 1.8 1.8   Liver Function Tests: No results for input(s): AST, ALT, ALKPHOS, BILITOT, PROT, ALBUMIN in the last 168 hours. No results for input(s): LIPASE, AMYLASE in the last 168 hours. No results for input(s): AMMONIA in the last 168 hours. CBC: Recent Labs  Lab 12/11/17 0554 12/12/17 0505 12/13/17 1021 12/14/17 0426 12/15/17 0458  WBC 11.5* 12.1* 16.4* 11.1* 10.5  HGB 11.4* 12.1* 12.2* 9.5* 10.0*  HCT 34.8* 36.4* 37.4* 28.6* 30.5*  MCV 95.1 94.8 96.4 94.4 95.6  PLT 202 211 199 179 206   Cardiac Enzymes: No results for input(s): CKTOTAL, CKMB, CKMBINDEX, TROPONINI in the last 168 hours. BNP: BNP (last 3 results) Recent Labs    12/08/17 0347  BNP 321.2*    ProBNP (last 3 results) No results for input(s): PROBNP in the last 8760 hours.  CBG: No results for input(s): GLUCAP in the last 168 hours.     Signed:  Florencia Reasons MD, PhD  [facility] 12/15/2017, 10:11 AM

## 2017-12-16 ENCOUNTER — Other Ambulatory Visit: Payer: Self-pay

## 2017-12-16 DIAGNOSIS — Z48812 Encounter for surgical aftercare following surgery on the circulatory system: Secondary | ICD-10-CM

## 2017-12-16 DIAGNOSIS — L97922 Non-pressure chronic ulcer of unspecified part of left lower leg with fat layer exposed: Secondary | ICD-10-CM

## 2017-12-16 DIAGNOSIS — I739 Peripheral vascular disease, unspecified: Secondary | ICD-10-CM

## 2017-12-20 ENCOUNTER — Telehealth: Payer: Self-pay

## 2017-12-20 NOTE — Telephone Encounter (Signed)
I called patient to scheduled hospital follow up appointment with Dr Dulce SellarMunley.  Patient states that his wife is in charge of scheduling his appointment, and he will have her to return call to office to get appointment scheduled.

## 2017-12-24 NOTE — Progress Notes (Signed)
Cardiology Office Note:    Date:  12/25/2017   ID:  Peter Cochran, DOB [birth year], MRN [id number]  PCP:  Benjiman Core, FNP  Cardiologist:  Shirlee More, MD    Referring MD: Benjiman Core, FNP    ASSESSMENT:    1. Chronic systolic heart failure ([place])   2. Bilateral lower leg cellulitis   3. Non-rheumatic mitral regurgitation    PLAN:    In order of problems listed above:  1. Improved he is tolerating medical therapy loop diuretic small dose of beta-blocker and ARNI.  He looks volume contracted would reduce his diuretic to 3 days/week and continue his current medical treatment provided systolic blood pressures are greater than 90.  I reviewed the results of his recent myocardial perfusion study no ischemia EF 17% and once recovered from his peripheral vascular disease we may need to discuss ICD therapy. 2. Improved still on IV antibiotics awaiting surgical intervention for toe amputation 3. Stable clinically not severe   Next appointment: 3 to 4 weeks   Medication Adjustments/Labs and Tests Ordered: Current medicines are reviewed at length with the patient today.  Concerns regarding medicines are outlined above.  No orders of the defined types were placed in this encounter.  Meds ordered this encounter  Medications  . furosemide (LASIX) 20 MG tablet    Sig: Take 1 tablet (20 mg total) by mouth 3 (three) times a week. On Monday Wednesday friday    Dispense:  90 tablet    Refill:  3    Chief Complaint  Patient presents with  . Congestive Heart Failure  . Mitral Regurgitation    History of Present Illness:    Peter Cochran is a 62 y.o. male with a hx of systolic heart failure and PAD  last seen by me 11/12/17.   ASSESSMENT:   10/28/17   1. Dilated cardiomyopathy ([place])   2. Non-rheumatic mitral regurgitation   3. Elevated troponin   4. Elevated brain natriuretic peptide (BNP) level   5. PAD (peripheral artery disease) (HCC)   6. Leg ulcer, left, with fat  layer exposed ([place])   PLAN:    1.   He has evidence of decompensation heart failure fluid overloaded. I have asked him to stop gabapentin which favors edema. He will start a loop diuretic furosemide 20 mg daily. With low blood pressure he will reduce his lisinopril to 2.5 mg 3 days a week and hold if systolics less than 882 reduce his Spironolactone by 50% continue minimum dose of carvedilol. He is at risk for ventricular arrhythmia but none identified in hospital I would utilize a LifeVest. He also require an ischemia evaluation at some point. I would like to try to optimize treatment and compensated heart failure before that. I have asked him to sodium restrict and we will see if we get the heart failure program to come and visit him at home. Recheck renal function and BMP at his next visit and try to access labs done Friday at his PCP office 4. Mild secondary to cardiomyopathy 5. Secondary to severe cardiomyopathy 6. Consistent with a severe cardiomyopathy 7. With nonhealing leg wound is down to a few cigarettes a day and I told him strongly he needs to stop and I will see if I can expedite his appointment with vascular surgery 8. Continue home health care weight appointments with vascular surgery     He was recently admitted to [facility] and transferred to [facility]: Discharge Diagnoses:  Active Hospital Problems   Diagnosis Date Noted  . Malnutrition of moderate degree 12/12/2017  . Bilateral lower leg cellulitis   . Gangrene of toe of left foot ([place])   . Pressure injury of skin 12/07/2017  . Sepsis due to cellulitis ([place]) 12/06/2017  . 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    Discharge Condition: stable  Diet recommendation: heart healthy       Filed Weights   12/13/17 0451 12/14/17 0525 12/15/17 0353  Weight: 90 kg (198 lb 6.6 oz) 90.2 kg (198 lb 13.7 oz) 90 kg  (198 lb 6.6 oz)    History of present illness: (per admitting MD Dr Ara Kussmaul) BTD:[id number] a 62 y.o.malewith a known history of CHF, PAD,presents to the emergency department for evaluation of wound infection. Patient was an inpatient at [facility] for sepsis 2/2 wound infections (admitted 12/02/17) but left AMA this evening and drove himself to [facility]. The plan was to transport him by ambulance for a wound/vascular consult but he could not tolerated being transported with his legs elevated. He reports worsening of his bilateral lower extremity edema, worsening left 1st and 2nd toes which have become black.He has been under the care of wound care.He complains of severe pain to bilateral lower extremities which is worse with movement.   From EDP note:Records obtained show recent ABI of 1.7 on the right and 0.74 on the left. Patient has been having upward trending creatinine. Most recent labs show WBC 16.3, hemoglobin 12.1, creatinine of 0.8 ->1.3 ->1.9. Patient was being treated with aztreonam, clindamycin, and vancomycin. He had imaging of the right and left tib-fib which were negative.  Patient denies fevers/chills, weakness, dizziness, chest pain, shortness of breath, N/V/C/D, abdominal pain, dysuria/frequency, changes in mental status.   Otherwise there has been no change in status.   EMS/ED Course:Patient received azactam, vanco, clinda, NS. Medical admission has been requested for further management ofbilateral lower extremity cellulitis, gangrene of the toe of the left foot, AKI.    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 Continue his aspirin and Plavix at this time.he is to follow up with vascular surgery Dr Bridgett Larsson -Infectious disease recommended treat for total of 4 weeks with Zosyn due to previous history of pseudomonal infection. PICC lineplaced -orthopedic Dr Sharol Given following, plan for amputation in the near future once edema improves.   Acute kidney injury, resolved -This is resolved, closely monitor his urine output and renal function daily.  Chronic systolic congestive heart failure, ejection fraction 25% -Patient appears to be euvolemic in nature overall but given significant lower extremity swelling, he received  IV Lasix in the hospital. Delene Loll is on held in the hospital due to borderline bp, resumed at discharge at home dose, continue low dose coreg, oral lasix and spironolactone. 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 Foley is in place. -keep foley in due to altered anatomy, outpatient urology follow up     Compliance with diet, lifestyle and medications: Yes  He is fatigue somewhat discouraged about his peripheral vascular disease and cellulitis but is improved.  No shortness of breath edema chest pain palpitation or syncope and tolerates medical therapy blood pressure often is 90-100 and I have asked his family to split his carvedilol from [facility] to avoid hypotension. Past Medical History:  Diagnosis Date  . Cellulitis    mild.  L leg  . CHF  (congestive heart failure) (HCC)    Dr. Shirlee More  . 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  . Hand fracture, right    Dr. Joya Salm  . Hypothyroidism (acquired)   . Leg ulcer, left ([place])   . PAD (peripheral artery disease) ([place])   . Periorbital hematoma, right   . Peripheral neuropathy   . Toe ulcer ([place])    Left  . Wound of left leg    Non-healing. Dr. Meda Coffee, Dr. Cherlyn Labella.    Past Surgical History:  Procedure Laterality Date  . AORTOGRAM Left 12/12/2017   Procedure: AORTOGRAM LEFT LOWER EXTREMITY RUNOFF;  Surgeon: Conrad Ewing, MD;  Location: [facility];  Service: Vascular;  Laterality: Left;  . CYST EXCISION    . PERIPHERAL VASCULAR BALLOON ANGIOPLASTY Left 12/12/2017   Procedure: PERIPHERAL VASCULAR BALLOON ANGIOPLASTY LEFT SFA;  Surgeon: Conrad [place], MD;  Location: [facility] OR;  Service: Vascular;  Laterality: Left;    Current Medications: Current Meds  Medication Sig  . aspirin EC 81 MG tablet Take 81 mg by mouth daily.  Marland Kitchen atorvastatin (LIPITOR) 40 MG tablet Take 1 tablet (40 mg total) by mouth daily.  . carvedilol (COREG) 3.125 MG tablet Take 1 tablet (3.125 mg total) by mouth 2 (two) times daily with a meal.  . clopidogrel (PLAVIX) 75 MG tablet Take 1 tablet (75 mg total) by mouth daily with breakfast.  . collagenase (SANTYL) ointment Apply topically daily.  . DULoxetine (CYMBALTA) 30 MG capsule Take 30 mg by mouth daily.  . feeding supplement, ENSURE ENLIVE, (ENSURE ENLIVE) LIQD Take 237 mLs by mouth 2 (two) times daily between meals.  . furosemide (LASIX) 20 MG tablet Take 1 tablet (20 mg total) by mouth 3 (three) times a week. On Monday Wednesday friday  . hydrocerin (EUCERIN) CREA Apply 1 application topically 2 (two) times daily.  Marland Kitchen HYDROcodone-acetaminophen (NORCO/VICODIN) 5-325 MG tablet Take 1 tablet by mouth every 4 (four) hours as needed for pain.  Marland Kitchen levothyroxine (SYNTHROID, LEVOTHROID) 100 MCG tablet Take 100 mcg by mouth daily before  breakfast.   . Multiple Vitamin (MULTIVITAMIN WITH MINERALS) TABS tablet Take 1 tablet by mouth daily.  . piperacillin-tazobactam (ZOSYN) IVPB Inject 3.375 g into the vein every 8 (eight) hours for 26 days. Indication:  Cellulitis (Pseud[place]) Last Day of Therapy:  01/10/18 Labs - Once weekly:  CBC/D and BMP, Labs - Every other week:  ESR and CRP  . sacubitril-valsartan (ENTRESTO) 24-26 MG Take 1 tablet by mouth 3 (three) times a week.  . senna-docusate (SENOKOT-S) 8.6-50 MG tablet Take 1 tablet by mouth at bedtime as needed for mild constipation.  Marland Kitchen spironolactone (ALDACTONE) 25 MG tablet Take  1 tablet (25 mg total) by mouth daily.  . tamsulosin (FLOMAX) 0.4 MG CAPS capsule Take 1 capsule (0.4 mg total) by mouth daily after breakfast.  . [DISCONTINUED] furosemide (LASIX) 20 MG tablet Take 1 tablet (20 mg total) by mouth daily.     Allergies:   Oxytetracycline and Sulfa antibiotics   Social History   Socioeconomic History  . Marital status: Married    Spouse name: Not on file  . Number of children: Not on file  . Years of education: Not on file  . Highest education level: Not on file  Occupational History  . Not on file  Social Needs  . Financial resource strain: Not on file  . Food insecurity:    Worry: Not on file    Inability: Not on file  . Transportation needs:    Medical: Not on file    Non-medical: Not on file  Tobacco Use  . Smoking status: Current Every Day Smoker    Packs/day: 2.00    Years: 50.00    Pack years: 100.00    Types: Cigarettes  . Smokeless tobacco: Never Used  Substance and Sexual Activity  . Alcohol use: Not Currently  . Drug use: Not Currently  . Sexual activity: Not on file  Lifestyle  . Physical activity:    Days per week: Not on file    Minutes per session: Not on file  . Stress: Not on file  Relationships  . Social connections:    Talks on phone: Not on file    Gets together: Not on file    Attends religious service: Not on file     Active member of club or organization: Not on file    Attends meetings of clubs or organizations: Not on file    Relationship status: Not on file  Other Topics Concern  . Not on file  Social History Narrative  . Not on file     Family History: The patient's family history includes Diabetes in his father; Heart attack in his father; Hyperlipidemia in his father and mother; Hypertension in his father and mother; Stroke in his maternal grandmother. ROS:   Please see the history of present illness.    All other systems reviewed and are negative.  EKGs/Labs/Other Studies Reviewed:    The following studies were reviewed today:   Recent Labs: 12/07/2017: ALT 18 12/08/2017: B Natriuretic Peptide 321.2 12/15/2017: BUN 18; Creatinine, Ser 0.91; Hemoglobin 10.0; Magnesium 1.8; Platelets 206; Potassium 3.9; Sodium 135; TSH 7.599  Recent Lipid Panel No results found for: CHOL, TRIG, HDL, CHOLHDL, VLDL, LDLCALC, LDLDIRECT  Physical Exam:    VS:  BP (!) 98/56 (BP Location: Right Arm, Patient Position: Sitting, Cuff Size: Normal)   Pulse 97   Ht '5\' 10"'  (1.778 m)   SpO2 98%   BMI 28.47 kg/m     Wt Readings from Last 3 Encounters:  12/15/17 198 lb 6.6 oz (90 kg)  10/28/17 235 lb (106.6 kg)     GEN:  Well nourished, well developed in no acute distress HEENT: Normal NECK: No JVD; No carotid bruits LYMPHATICS: No lymphadenopathy CARDIAC: RRR, no murmurs, rubs, gallops RESPIRATORY:  Clear to auscultation without rales, wheezing or rhonchi  ABDOMEN: Soft, non-tender, non-distended MUSCULOSKELETAL:  No edema; No deformity  SKIN: Warm and dry NEUROLOGIC:  Alert and oriented x 3 PSYCHIATRIC:  Normal affect    Signed, Shirlee More, MD  12/25/2017 12:22 PM    [facility]

## 2017-12-25 ENCOUNTER — Encounter: Payer: Self-pay | Admitting: Cardiology

## 2017-12-25 ENCOUNTER — Ambulatory Visit: Payer: PRIVATE HEALTH INSURANCE | Admitting: Cardiology

## 2017-12-25 VITALS — BP 98/56 | HR 97 | Ht 70.0 in

## 2017-12-25 DIAGNOSIS — L03115 Cellulitis of right lower limb: Secondary | ICD-10-CM

## 2017-12-25 DIAGNOSIS — I340 Nonrheumatic mitral (valve) insufficiency: Secondary | ICD-10-CM | POA: Diagnosis not present

## 2017-12-25 DIAGNOSIS — L03116 Cellulitis of left lower limb: Secondary | ICD-10-CM | POA: Diagnosis not present

## 2017-12-25 DIAGNOSIS — I5022 Chronic systolic (congestive) heart failure: Secondary | ICD-10-CM | POA: Diagnosis not present

## 2017-12-25 MED ORDER — FUROSEMIDE 20 MG PO TABS: 20.0000 mg | ORAL_TABLET | ORAL | 3 refills | Status: DC

## 2017-12-25 NOTE — Patient Instructions (Signed)
Medication Instructions:  Your physician has recommended you make the following change in your medication: DECREASE furosemide to 20 mg Monday, Wednesday, Friday  Labwork: None  Testing/Procedures: None  Follow-Up: Your physician recommends that you schedule a follow-up appointment in: 4 weeks in [place].  Any Other Special Instructions Will Be Listed Below (If Applicable).     If you need a refill on your cardiac medications before your next appointment, please call your pharmacy.

## 2017-12-26 ENCOUNTER — Encounter (INDEPENDENT_AMBULATORY_CARE_PROVIDER_SITE_OTHER): Payer: Self-pay | Admitting: Orthopedic Surgery

## 2017-12-26 ENCOUNTER — Ambulatory Visit (INDEPENDENT_AMBULATORY_CARE_PROVIDER_SITE_OTHER): Payer: PRIVATE HEALTH INSURANCE | Admitting: Orthopedic Surgery

## 2017-12-26 VITALS — Ht 70.0 in | Wt 198.0 lb

## 2017-12-26 DIAGNOSIS — I96 Gangrene, not elsewhere classified: Secondary | ICD-10-CM | POA: Diagnosis not present

## 2017-12-26 DIAGNOSIS — L97922 Non-pressure chronic ulcer of unspecified part of left lower leg with fat layer exposed: Secondary | ICD-10-CM | POA: Diagnosis not present

## 2017-12-26 DIAGNOSIS — E43 Unspecified severe protein-calorie malnutrition: Secondary | ICD-10-CM | POA: Diagnosis not present

## 2017-12-26 NOTE — Progress Notes (Signed)
Office Visit Note   Patient: Peter Cochran           Date of Birth: 1955/10/14           MRN: [id number] Visit Date: 12/26/2017              Requested by: Floria Raveling, FNP [address] [place], [place] [address] PCP: Floria Raveling, FNP  Chief Complaint  Patient presents with  . Right Leg - Follow-up  . Left Leg - Follow-up      HPI: Patient is a 62 year old gentleman who presents in follow-up for venous and arterial insufficiency both lower extremities status post revascularization to the left lower extremity with balloon angioplasty with dry gangrene of the toes and lateral calf venous stasis ulcers.  Assessment & Plan: Visit Diagnoses:  1. Gangrene of toe of left foot (HCC)   2. 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 Severe protein-calorie malnutrition (HCC)     Plan: Patient was given a pair of large stockings to wear he should wear these 24 hours a day.  Instructions for elevation and protein supplement was discussed.  Patient is completing a course of IV antibiotics through a PICC line.  Follow-Up Instructions: Return in about 2 weeks (around 01/09/2018).   Ortho Exam  Patient is alert, oriented, no adenopathy, well-dressed, normal affect, normal respiratory effort. Examination there is dry gangrenous changes to the great toe and second toe left foot there is no abscess no cellulitis no signs of infection.  Patient has had steady decrease edema to the left lower extremity and right lower extremity with wearing the medical compression stockings.  Patient states that the right leg feels extremely good with wearing the sock left leg he states it is uncomfortable with removing the stocking.  Examination the wound is now flat there is improved granulation tissue in the wound bed there is no cellulitis no odor no signs of infection.  Calf measures 37 cm in circumference.  Discussed the importance of protein supplement his albumin is  2.4.  Imaging: No results found. No images are attached to the encounter.  Labs: Lab Results  Component Value Date   HGBA1C 6.0 (H) 12/07/2017   ESRSEDRATE 78 (H) 12/07/2017   CRP 13.5 (H) 12/07/2017     Lab Results  Component Value Date   ALBUMIN 2.4 (L) 12/07/2017   ALBUMIN 2.8 (L) 12/06/2017   PREALBUMIN 10.8 (L) 12/07/2017    Body mass index is 28.41 kg/m.  Orders:  No orders of the defined types were placed in this encounter.  No orders of the defined types were placed in this encounter.    Procedures: No procedures performed  Clinical Data: No additional findings.  ROS:  All other systems negative, except as noted in the HPI. Review of Systems  Objective: Vital Signs: Ht 5\' 10"  (1.778 m)   Wt 198 lb (89.8 kg)   BMI 28.41 kg/m   Specialty Comments:  No specialty comments available.  PMFS History: Patient Active Problem List   Diagnosis Date Noted  . Antibiotic long-term use   . AKI (acute kidney injury) (HCC)   . Severe protein-calorie malnutrition (HCC) 12/12/2017  . Bilateral lower leg cellulitis   . Gangrene of toe of left foot (HCC)   . Pressure injury of skin 12/07/2017  . Sepsis due to cellulitis (HCC) 12/06/2017  . Chronic systolic heart failure (HCC) 10/28/2017  . Dilated cardiomyopathy (HCC) 10/27/2017  .  Mitral regurgitation 10/27/2017  . Elevated troponin 10/27/2017  . Elevated brain natriuretic peptide (BNP) level 10/27/2017  . PAD (peripheral artery disease) (HCC) 10/27/2017  . 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 Fatigue 10/22/2017   Past Medical History:  Diagnosis Date  . Cellulitis    mild.  L leg  . CHF (congestive heart failure) (HCC)    Dr. Norman HerrlichBrian Munley  . 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  . Hand fracture, right    Dr. Linda HedgesJeffrey Yaste  . Hypothyroidism (acquired)   . Leg ulcer, left (HCC)   . PAD (peripheral artery disease) (HCC)   . Periorbital hematoma, right    . Peripheral neuropathy   . Toe ulcer (HCC)    Left  . Wound of left leg    Non-healing. Dr. Liston AlbaPedro Hernandez, Dr. Bynum Bellowsitorya Strover.    Family History  Problem Relation Age of Onset  . Hypertension Mother   . Hyperlipidemia Mother   . Heart attack Father   . Diabetes Father   . Hypertension Father   . Hyperlipidemia Father   . Stroke Maternal Grandmother     Past Surgical History:  Procedure Laterality Date  . AORTOGRAM Left 12/12/2017   Procedure: AORTOGRAM LEFT LOWER EXTREMITY RUNOFF;  Surgeon: Fransisco Hertzhen, Brian L, MD;  [facility];  Service: Vascular;  Laterality: Left;  . CYST EXCISION    . PERIPHERAL VASCULAR BALLOON ANGIOPLASTY Left 12/12/2017   Procedure: PERIPHERAL VASCULAR BALLOON ANGIOPLASTY LEFT SFA;  Surgeon: Fransisco Hertzhen, Brian L, MD;  [facility];  Service: Vascular;  Laterality: Left;   Social History   Occupational History  . Not on file  Tobacco Use  . Smoking status: Current Every Day Smoker    Packs/day: 2.00    Years: 50.00    Pack years: 100.00    Types: Cigarettes  . Smokeless tobacco: Never Used  Substance and Sexual Activity  . Alcohol use: Not Currently  . Drug use: Not Currently  . Sexual activity: Not on file

## 2017-12-31 ENCOUNTER — Encounter

## 2017-12-31 ENCOUNTER — Encounter: Payer: PRIVATE HEALTH INSURANCE | Admitting: Vascular Surgery

## 2018-01-03 ENCOUNTER — Inpatient Hospital Stay (HOSPITAL_COMMUNITY): Admit: 2018-01-03 | Payer: PRIVATE HEALTH INSURANCE

## 2018-01-07 NOTE — Progress Notes (Signed)
Postoperative Visit ([place])   History of Present Illness   Peter Cochran is a 62 y.o. male who presents cc:  Left foot wounds.  Prior procedure include: 1. SIA+S L SFA (12/12/17)  The patient's wounds are NOT healed.  Patient and family heal some of the toes on L foot are healing.  He still is wheelchair bound.  Dr. Sharol Given is managing his leg wounds.  Pt returned today for ABI and BLE venous reflux studies.  Past Medical History, Past Surgical History, Social History, Family History, Medications, Allergies, and Review of Systems were reviewed on 01/08/18 are unchanged from previous evaluation on 12/13/17.  Current Outpatient Medications  Medication Sig Dispense Refill  . aspirin EC 81 MG tablet Take 81 mg by mouth daily.    Marland Kitchen atorvastatin (LIPITOR) 40 MG tablet Take 1 tablet (40 mg total) by mouth daily. 30 tablet 11  . carvedilol (COREG) 3.125 MG tablet Take 1 tablet (3.125 mg total) by mouth 2 (two) times daily with a meal. 60 tablet 11  . clopidogrel (PLAVIX) 75 MG tablet Take 1 tablet (75 mg total) by mouth daily with breakfast. 30 tablet 0  . collagenase (SANTYL) ointment Apply topically daily. 15 g 0  . DULoxetine (CYMBALTA) 30 MG capsule Take 30 mg by mouth daily.    . feeding supplement, ENSURE ENLIVE, (ENSURE ENLIVE) LIQD Take 237 mLs by mouth 2 (two) times daily between meals. 237 mL 0  . furosemide (LASIX) 20 MG tablet Take 1 tablet (20 mg total) by mouth 3 (three) times a week. On Monday Wednesday friday 90 tablet 3  . hydrocerin (EUCERIN) CREA Apply 1 application topically 2 (two) times daily. 113 g 0  . HYDROcodone-acetaminophen (NORCO/VICODIN) 5-325 MG tablet Take 1 tablet by mouth every 4 (four) hours as needed for pain.    Marland Kitchen levothyroxine (SYNTHROID, LEVOTHROID) 100 MCG tablet Take 100 mcg by mouth daily before breakfast.     . Multiple Vitamin (MULTIVITAMIN WITH MINERALS) TABS tablet Take 1 tablet by mouth daily. 30 tablet 0  . piperacillin-tazobactam (ZOSYN) IVPB Inject  3.375 g into the vein every 8 (eight) hours for 26 days. Indication:  Cellulitis (Pseudomonas) Last Day of Therapy:  01/10/18 Labs - Once weekly:  CBC/D and BMP, Labs - Every other week:  ESR and CRP 81 Units 0  . sacubitril-valsartan (ENTRESTO) 24-26 MG Take 1 tablet by mouth 3 (three) times a week. 15 tablet 3  . senna-docusate (SENOKOT-S) 8.6-50 MG tablet Take 1 tablet by mouth at bedtime as needed for mild constipation. 14 tablet 0  . spironolactone (ALDACTONE) 25 MG tablet Take 1 tablet (25 mg total) by mouth daily. 90 tablet 3  . tamsulosin (FLOMAX) 0.4 MG CAPS capsule Take 1 capsule (0.4 mg total) by mouth daily after breakfast. 30 capsule 0   No current facility-administered medications for this visit.     ROS: no rest pain, L foot gangrene partially healing   For VQI Use Only   PRE-ADM LIVING: Home  AMB STATUS: Wheelchair   Physical Examination   Vitals:   01/08/18 1211  BP: (!) 92/59  Pulse: 93  Temp: (!) 96.5 F (35.8 C)  TempSrc: Oral  SpO2: 100%  Height: _0  (1.803 m)   Body mass index is 27.62 kg/m.  General Alert, O x 3, WD, NAD  Pulmonary Sym exp, good B air movt, CTA B  Cardiac RRR, Nl S1, S2, no Murmurs, No rubs, No S3,S4  Vascular Vessel Right Left  Radial  Palpable Palpable  Brachial Palpable Palpable  Carotid Palpable, No Bruit Palpable, No Bruit  Aorta Not palpable N/A  Femoral Palpable Palpable  Popliteal Not palpable Not palpable  PT Faintly palpable Not palpable due to dressing  DP Faintly palpable Not palpable due to dressing    Gastrointestinal soft, non-distended, non-tender to palpation, No guarding or rebound, no HSM, no masses, no CVAT B, No palpable prominent aortic pulse,    Musculoskeletal M/S 5/5 BUE, B feet not evaluated due to wounds  , L foot bandaged to calf, R distal calf bandaged, stasis venous skin changes evident in both legs  Neurologic Pain and light touch intact in hands and decreased in R foot, Motor exam as listed  above   Non-invasive Vascular Imaging   ABI (01/08/2018)  R:   ABI: 1.34,   Waveforms not done due to bandages  TBI:  0.91  L:   Not done to bandages  BLE Venous reflux (01/08/2018)  Technical issues with study due to bandages   Medical Decision Making   Peter Cochran is a 62 y.o. male who presents s/p SIA+S L SFA, B leg ulcers likely due to combined venous and arterial disease.  Based on his angiographic findings, this patient needs: repeat BLE ABI in 3 months. Pt will also need repeat venous reflux studies once wounds stable.. I discussed in depth with the patient the nature of atherosclerosis, and emphasized the importance of maximal medical management including strict control of blood pressure, blood glucose, and lipid levels, obtaining regular exercise, and cessation of smoking.  The patient is aware that without maximal medical management the underlying atherosclerotic disease process will progress, limiting the benefit of any interventions. The patient is currently on a statin: Lipitor.  The patient is currently on an anti-platelet: ASA and Plavix.  Thank you for allowing [place] to participate in this patient's care.   Adele Barthel, MD, FACS [facility] Office: [phone] Pager: [phone]

## 2018-01-08 ENCOUNTER — Ambulatory Visit (INDEPENDENT_AMBULATORY_CARE_PROVIDER_SITE_OTHER): Payer: PRIVATE HEALTH INSURANCE | Admitting: Vascular Surgery

## 2018-01-08 ENCOUNTER — Encounter: Payer: Self-pay | Admitting: Vascular Surgery

## 2018-01-08 ENCOUNTER — Ambulatory Visit (INDEPENDENT_AMBULATORY_CARE_PROVIDER_SITE_OTHER)
Admission: RE | Admit: 2018-01-08 | Discharge: 2018-01-08 | Disposition: A | Discharge status: Home / Self Care | Payer: PRIVATE HEALTH INSURANCE | Source: Ambulatory Visit | Attending: Vascular Surgery | Admitting: Vascular Surgery

## 2018-01-08 ENCOUNTER — Ambulatory Visit (HOSPITAL_COMMUNITY)
Admit: 2018-01-08 | Discharge: 2018-01-08 | Disposition: A | Discharge status: Home / Self Care | Payer: PRIVATE HEALTH INSURANCE | Source: Ambulatory Visit | Attending: Vascular Surgery | Admitting: Vascular Surgery

## 2018-01-08 VITALS — BP 92/59 | HR 93 | Temp 96.5°F | Ht 71.0 in

## 2018-01-08 DIAGNOSIS — L97922 Non-pressure chronic ulcer of unspecified part of left lower leg with fat layer exposed: Secondary | ICD-10-CM | POA: Diagnosis not present

## 2018-01-08 DIAGNOSIS — Z48812 Encounter for surgical aftercare following surgery on the circulatory system: Secondary | ICD-10-CM | POA: Diagnosis not present

## 2018-01-08 DIAGNOSIS — I96 Gangrene, not elsewhere classified: Secondary | ICD-10-CM

## 2018-01-08 DIAGNOSIS — I739 Peripheral vascular disease, unspecified: Secondary | ICD-10-CM

## 2018-01-13 ENCOUNTER — Ambulatory Visit (INDEPENDENT_AMBULATORY_CARE_PROVIDER_SITE_OTHER): Payer: PRIVATE HEALTH INSURANCE | Admitting: Orthopedic Surgery

## 2018-01-13 ENCOUNTER — Encounter (INDEPENDENT_AMBULATORY_CARE_PROVIDER_SITE_OTHER): Payer: Self-pay | Admitting: Orthopedic Surgery

## 2018-01-13 DIAGNOSIS — I96 Gangrene, not elsewhere classified: Secondary | ICD-10-CM

## 2018-01-13 DIAGNOSIS — E43 Unspecified severe protein-calorie malnutrition: Secondary | ICD-10-CM | POA: Diagnosis not present

## 2018-01-13 DIAGNOSIS — L97922 Non-pressure chronic ulcer of unspecified part of left lower leg with fat layer exposed: Secondary | ICD-10-CM

## 2018-01-13 NOTE — Progress Notes (Signed)
Office Visit Note   Patient: Peter Cochran           Date of Birth: Mar 01, 1956           MRN: [id number] Visit Date: 01/13/2018              Requested by: Floria RavelingWilson, Ashley H, FNP [address] [place], [place] [id number] PCP: Floria RavelingWilson, Ashley H, FNP  Chief Complaint  Patient presents with  . Left Foot - Pain      HPI: Patient is a 62 year old gentleman with severe peripheral vascular disease status post revascularization with stent placement for the left lower extremity.  Patient has significantly improved arterial circulation however he still has venous insufficiency both lower extremities and still is actively smoking.  Assessment & Plan: Visit Diagnoses:  1. Gangrene of toe of left foot (HCC)   2. 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 Severe protein-calorie malnutrition (HCC)     Plan: Discussed with the patient that we can continue with his wound care with home health nursing dressing changes twice a week wound center dressing changes on Friday.  Discussed that to proceed with foot salvage intervention he has to 100% stop smoking.  He cannot use any nicotine or vapor products.  Discussed the importance of walking to stimulate the calf pump to improve his circulation.  Discussed that with his severe microcirculatory disease he still may not heal a transmetatarsal amputation even with smoking cessation at this point.  Again discussed the importance of protein supplements.  Follow-Up Instructions: Return in about 1 month (around 02/10/2018).   Ortho Exam  Patient is alert, oriented, no adenopathy, well-dressed, normal affect, normal respiratory effort. 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  He has dry gangrene of the great toe second toe and third toe of the left foot.  He has very small venous ulcers on the right calf but has extensive venous insufficiency ulcers on the left calf.  There is venous  brawny skin color changes in both lower extremities due to chronic venous insufficiency.  There is no cellulitis or drainage from either leg.  With patient's new arterial insufficiency he does have bleeding from the granulation tissue.  Patient is on Plavix.  Imaging: No results found. No images are attached to the encounter.  Labs: Lab Results  Component Value Date   HGBA1C 6.0 (H) 12/07/2017   ESRSEDRATE 78 (H) 12/07/2017   CRP 13.5 (H) 12/07/2017     Lab Results  Component Value Date   ALBUMIN 2.4 (L) 12/07/2017   ALBUMIN 2.8 (L) 12/06/2017   PREALBUMIN 10.8 (L) 12/07/2017    There is no height or weight on file to calculate BMI.  Orders:  No orders of the defined types were placed in this encounter.  No orders of the defined types were placed in this encounter.    Procedures: No procedures performed  Clinical Data: No additional findings.  ROS:  All other systems negative, except as noted in the HPI. Review of Systems  Objective: Vital Signs: There were no vitals taken for this visit.  Specialty Comments:  No specialty comments available.  PMFS History: Patient Active Problem List   Diagnosis Date Noted  . Antibiotic long-term use   . AKI (acute kidney injury) (HCC)   . Severe protein-calorie malnutrition (HCC) 12/12/2017  . Bilateral lower leg cellulitis   . Gangrene of toe of left foot (HCC)   .  Pressure injury of skin 12/07/2017  . Sepsis due to cellulitis (HCC) 12/06/2017  . Chronic systolic heart failure (HCC) 10/28/2017  . Dilated cardiomyopathy (HCC) 10/27/2017  . Mitral regurgitation 10/27/2017  . Elevated troponin 10/27/2017  . Elevated brain natriuretic peptide (BNP) level 10/27/2017  . PAD (peripheral artery disease) (HCC) 10/27/2017  . 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 Fatigue 10/22/2017   Past Medical History:  Diagnosis Date  . Cellulitis    mild.  L leg  . CHF (congestive heart  failure) (HCC)    Dr. Norman Herrlich  . 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  . Hand fracture, right    Dr. Linda Hedges  . Hypothyroidism (acquired)   . Leg ulcer, left (HCC)   . PAD (peripheral artery disease) (HCC)   . Periorbital hematoma, right   . Peripheral neuropathy   . Toe ulcer (HCC)    Left  . Wound of left leg    Non-healing. Dr. Liston Alba, Dr. Bynum Bellows.    Family History  Problem Relation Age of Onset  . Hypertension Mother   . Hyperlipidemia Mother   . Heart attack Father   . Diabetes Father   . Hypertension Father   . Hyperlipidemia Father   . Stroke Maternal Grandmother     Past Surgical History:  Procedure Laterality Date  . AORTOGRAM Left 12/12/2017   Procedure: AORTOGRAM LEFT LOWER EXTREMITY RUNOFF;  Surgeon: Fransisco Hertz, MD;  Location: [facility] OR;  Service: Vascular;  Laterality: Left;  . CYST EXCISION    . PERIPHERAL VASCULAR BALLOON ANGIOPLASTY Left 12/12/2017   Procedure: PERIPHERAL VASCULAR BALLOON ANGIOPLASTY LEFT SFA;  Surgeon: Fransisco Hertz, MD;  Location: [facility] OR;  Service: Vascular;  Laterality: Left;   Social History   Occupational History  . Not on file  Tobacco Use  . Smoking status: Current Every Day Smoker    Packs/day: 2.00    Years: 50.00    Pack years: 100.00    Types: Cigarettes  . Smokeless tobacco: Never Used  Substance and Sexual Activity  . Alcohol use: Not Currently  . Drug use: Not Currently  . Sexual activity: Not on file

## 2018-01-14 ENCOUNTER — Ambulatory Visit (INDEPENDENT_AMBULATORY_CARE_PROVIDER_SITE_OTHER): Payer: PRIVATE HEALTH INSURANCE | Admitting: Internal Medicine

## 2018-01-14 ENCOUNTER — Encounter: Payer: Self-pay | Admitting: Internal Medicine

## 2018-01-14 VITALS — BP 102/63 | HR 102 | Temp 98.6°F

## 2018-01-14 DIAGNOSIS — E43 Unspecified severe protein-calorie malnutrition: Secondary | ICD-10-CM

## 2018-01-14 DIAGNOSIS — I96 Gangrene, not elsewhere classified: Secondary | ICD-10-CM

## 2018-01-14 MED ORDER — AMOXICILLIN-POT CLAVULANATE 875-125 MG PO TABS: 1.0000 | ORAL_TABLET | Freq: Two times a day (BID) | ORAL | 0 refills | Status: DC

## 2018-01-14 NOTE — Progress Notes (Signed)
RFV: hospital follow up for gangrene to left foot  Patient ID: Sandria ManlyRobert Markson, male   DOB: February 05, 1956, 62 y.o.   MRN: [id number]  HPI Mr Fraser Dinixon is a 62yo M with PAD,PVD,CAD, HLD, smoking, who was hospitalized at the end of May for gangrene to his feet he is s/p SIA+S L SFA, B leg ulcers. He is followed by Dr Imogene Burnhen and Dr Lajoyce Cornersuda who have noticed some slight improvement, But overall still has significant non healing wounds. Patient may still need TMA but concern is that he has siginficant burden of microvascular disease with on going poor healing of wounds.  The patient states that he has had a significant decrease in swelling of his legs since he was in the hospital. He has responded to compression wraps. No longer using compression socks given by dr duda   Outpatient Encounter Medications as of 01/14/2018  Medication Sig  . aspirin EC 81 MG tablet Take 81 mg by mouth daily.  Marland Kitchen. atorvastatin (LIPITOR) 40 MG tablet Take 1 tablet (40 mg total) by mouth daily.  . carvedilol (COREG) 3.125 MG tablet Take 1 tablet (3.125 mg total) by mouth 2 (two) times daily with a meal.  . clopidogrel (PLAVIX) 75 MG tablet Take 1 tablet (75 mg total) by mouth daily with breakfast.  . collagenase (SANTYL) ointment Apply topically daily.  . DULoxetine (CYMBALTA) 30 MG capsule Take 30 mg by mouth daily.  . feeding supplement, ENSURE ENLIVE, (ENSURE ENLIVE) LIQD Take 237 mLs by mouth 2 (two) times daily between meals.  . furosemide (LASIX) 20 MG tablet Take 1 tablet (20 mg total) by mouth 3 (three) times a week. On Monday Wednesday friday  . hydrocerin (EUCERIN) CREA Apply 1 application topically 2 (two) times daily.  Marland Kitchen. HYDROcodone-acetaminophen (NORCO/VICODIN) 5-325 MG tablet Take 1 tablet by mouth every 4 (four) hours as needed for pain.  Marland Kitchen. levothyroxine (SYNTHROID, LEVOTHROID) 100 MCG tablet Take 100 mcg by mouth daily before breakfast.   . Multiple Vitamin (MULTIVITAMIN WITH MINERALS) TABS tablet Take 1 tablet by  mouth daily.  . sacubitril-valsartan (ENTRESTO) 24-26 MG Take 1 tablet by mouth 3 (three) times a week.  . senna-docusate (SENOKOT-S) 8.6-50 MG tablet Take 1 tablet by mouth at bedtime as needed for mild constipation.  Marland Kitchen. spironolactone (ALDACTONE) 25 MG tablet Take 1 tablet (25 mg total) by mouth daily.  . tamsulosin (FLOMAX) 0.4 MG CAPS capsule Take 1 capsule (0.4 mg total) by mouth daily after breakfast.  . traMADol (ULTRAM) 50 MG tablet Take by mouth every 4 (four) hours.   No facility-administered encounter medications on file as of 01/14/2018.      Patient Active Problem List   Diagnosis Date Noted  . Antibiotic long-term use   . AKI (acute kidney injury) (HCC)   . Severe protein-calorie malnutrition (HCC) 12/12/2017  . Bilateral lower leg cellulitis   . Gangrene of toe of left foot (HCC)   . Pressure injury of skin 12/07/2017  . Sepsis due to cellulitis (HCC) 12/06/2017  . Chronic systolic heart failure (HCC) 10/28/2017  . Dilated cardiomyopathy (HCC) 10/27/2017  . Mitral regurgitation 10/27/2017  . Elevated troponin 10/27/2017  . Elevated brain natriuretic peptide (BNP) level 10/27/2017  . PAD (peripheral artery disease) (HCC) 10/27/2017  . 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 Fatigue 10/22/2017     Health Maintenance Due  Topic Date Due  . Hepatitis C Screening  February 05, 1956  . TETANUS/TDAP  05/04/1976  .  COLONOSCOPY  05/05/2007     Review of Systems Lower extremity wound, 12 point ros is otherwise negative Physical Exam  BP 102/63   Pulse (!) 102   Temp 98.6 F (37 C) (Oral)   Constitutional:  oriented to person, place, and time. appears chronically ill and mal-nourished. No distress.  HENT: [place]/AT, PERRLA, no scleral icterus Mouth/Throat: Oropharynx is clear and moist. No oropharyngeal exudate.  Cardiovascular: Normal rate, regular rhythm and normal heart sounds. Exam reveals no gallop and no friction rub.  No murmur  heard.  Pulmonary/Chest: Effort normal and breath sounds normal. No respiratory distress.  has no wheezes.  Neck = supple, no nuchal rigidity Lymphadenopathy: no cervical adenopathy. No axillary adenopathy Neurological: alert and oriented to person, place, and time.  Skin: foot showing still signs of necrotic changes but less exudate and less foul smelling drainage Psychiatric: a normal mood and affect.  behavior is normal.   CBC Lab Results  Component Value Date   WBC 10.5 12/15/2017   RBC 3.19 (L) 12/15/2017   HGB 10.0 (L) 12/15/2017   HCT 30.5 (L) 12/15/2017   PLT 206 12/15/2017   MCV 95.6 12/15/2017   MCH 31.3 12/15/2017   MCHC 32.8 12/15/2017   RDW 15.1 12/15/2017   LYMPHSABS 0.6 (L) 12/06/2017   MONOABS 1.2 (H) 12/06/2017   EOSABS 0.0 12/06/2017    BMET Lab Results  Component Value Date   NA 135 12/15/2017   K 3.9 12/15/2017   CL 96 (L) 12/15/2017   CO2 32 12/15/2017   GLUCOSE 114 (H) 12/15/2017   BUN 18 12/15/2017   CREATININE 0.91 12/15/2017   CALCIUM 8.2 (L) 12/15/2017   GFRNONAA >60 12/15/2017   GFRAA >60 12/15/2017      Assessment and Plan Necrotic/gangrenous left foot wound = Amox/clav 875mg  bid x 4 wk. Will pull picc line.  Severe protein-caloric Malnutrition = loss of appetite could be in part due to abtx. Recommend to increase nutritional intake to help with healing

## 2018-01-15 LAB — C-REACTIVE PROTEIN: CRP: 17.2 mg/L — ABNORMAL HIGH (ref <8.0)

## 2018-01-15 LAB — SEDIMENTATION RATE: SED RATE: 46 mm/h — AB (ref 0–20)

## 2018-01-22 ENCOUNTER — Ambulatory Visit: Payer: PRIVATE HEALTH INSURANCE | Admitting: Cardiology

## 2018-01-31 ENCOUNTER — Encounter: Payer: Self-pay | Admitting: Sports Medicine

## 2018-01-31 ENCOUNTER — Ambulatory Visit: Payer: PRIVATE HEALTH INSURANCE | Admitting: Sports Medicine

## 2018-01-31 VITALS — BP 92/57 | HR 95 | Temp 98.5°F | Resp 16

## 2018-01-31 DIAGNOSIS — I96 Gangrene, not elsewhere classified: Secondary | ICD-10-CM

## 2018-01-31 DIAGNOSIS — M79672 Pain in left foot: Secondary | ICD-10-CM | POA: Diagnosis not present

## 2018-01-31 DIAGNOSIS — L97929 Non-pressure chronic ulcer of unspecified part of left lower leg with unspecified severity: Secondary | ICD-10-CM

## 2018-01-31 DIAGNOSIS — M79671 Pain in right foot: Secondary | ICD-10-CM

## 2018-01-31 DIAGNOSIS — I872 Venous insufficiency (chronic) (peripheral): Secondary | ICD-10-CM | POA: Diagnosis not present

## 2018-01-31 DIAGNOSIS — L97919 Non-pressure chronic ulcer of unspecified part of right lower leg with unspecified severity: Secondary | ICD-10-CM

## 2018-01-31 DIAGNOSIS — I739 Peripheral vascular disease, unspecified: Secondary | ICD-10-CM

## 2018-01-31 DIAGNOSIS — I87013 Postthrombotic syndrome with ulcer of bilateral lower extremity: Secondary | ICD-10-CM

## 2018-01-31 DIAGNOSIS — I83029 Varicose veins of left lower extremity with ulcer of unspecified site: Secondary | ICD-10-CM

## 2018-01-31 NOTE — Progress Notes (Signed)
Subjective: Peter Cochran is a 62 y.o. male patient seen in office for evaluation of gangrene of the left first and second toes and ulcerations to bilateral legs. Patient since I last saw him was admitted to the hospital and is currently following with infectious disease who have remove the PICC line and currently has him on oral Augmentin.  Patient also reports that he was seen by Dr. Sharol Given while in the hospital and was not favorable of his bedside manner and reports that Dr. due to had mention a partial foot amputation of which they do not want him to do.  Patient also is currently following with vascular with Dr. Bridgett Larsson who has performed stenting procedure and recommends reevaluation ABIs in 3 months.  Patient is currently seeking and receiving wound care at the wound care center for venous leg ulcerations.  Patient reports that he stopped smoking for 14 days. Denies nausea/fever/vomiting/chills/night sweats/shortness of breath/pain. Patient has no other pedal complaints at this time.    Patient Active Problem List   Diagnosis Date Noted  . Antibiotic long-term use   . AKI (acute kidney injury) ([place])   . Severe protein-calorie malnutrition ([place]) 12/12/2017  . Bilateral lower leg cellulitis   . Gangrene of toe of left foot ([place])   . Pressure injury of skin 12/07/2017  . Sepsis due to cellulitis ([place]) 12/06/2017  . Chronic systolic heart failure ([place]) 10/28/2017  . Dilated cardiomyopathy ([place]) 10/27/2017  . Mitral regurgitation 10/27/2017  . Elevated troponin 10/27/2017  . Elevated brain natriuretic peptide (BNP) level 10/27/2017  . PAD (peripheral artery disease) ([place]) 10/27/2017  . 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 Fatigue 10/22/2017   Current Outpatient Medications on File Prior to Visit  Medication Sig Dispense Refill  . amoxicillin-clavulanate (AUGMENTIN) 875-125 MG tablet Take 1 tablet by mouth 2 (two) times daily. 60 tablet 0  . aspirin EC  81 MG tablet Take 81 mg by mouth daily.    Mar[place] Kitchen atorvastatin (LIPITOR) 40 MG tablet Take 1 tablet (40 mg total) by mouth daily. 30 tablet 11  . carvedilol (COREG) 3.125 MG tablet Take 1 tablet (3.125 mg total) by mouth 2 (two) times daily with a meal. 60 tablet 11  . clopidogrel (PLAVIX) 75 MG tablet Take 1 tablet (75 mg total) by mouth daily with breakfast. 30 tablet 0  . DULoxetine (CYMBALTA) 30 MG capsule Take 30 mg by mouth daily.    . furosemide (LASIX) 20 MG tablet Take 1 tablet (20 mg total) by mouth 3 (three) times a week. On Monday Wednesday friday 90 tablet 3  . levothyroxine (SYNTHROID, LEVOTHROID) 100 MCG tablet Take 100 mcg by mouth daily before breakfast.     . Multiple Vitamin (MULTIVITAMIN WITH MINERALS) TABS tablet Take 1 tablet by mouth daily. 30 tablet 0  . sacubitril-valsartan (ENTRESTO) 24-26 MG Take 1 tablet by mouth 3 (three) times a week. 15 tablet 3  . spironolactone (ALDACTONE) 25 MG tablet Take 1 tablet (25 mg total) by mouth daily. 90 tablet 3  . tamsulosin (FLOMAX) 0.4 MG CAPS capsule Take 1 capsule (0.4 mg total) by mouth daily after breakfast. 30 capsule 0  . traMADol (ULTRAM) 50 MG tablet Take by mouth every 4 (four) hours.    . triamcinolone cream (KENALOG) 0.1 % Apply 1 application topically 2 (two) times daily.     No current facility-administered medications on file prior to visit.    Allergies  Allergen Reactions  . Oxytetracycline  Itching and Swelling    Facial swelling  . Sulfa Antibiotics Rash    Recent Results (from the past 2160 hour(s))  Comprehensive metabolic panel     Status: Abnormal   Collection Time: 12/06/17 12:27 PM  Result Value Ref Range   Sodium 135 135 - 145 mmol/L   Potassium 4.3 3.5 - 5.1 mmol/L   Chloride 104 101 - 111 mmol/L   CO2 18 (L) 22 - 32 mmol/L   Glucose, Bld 102 (H) 65 - 99 mg/dL   BUN 60 (H) 6 - 20 mg/dL   Creatinine, Ser 1.73 (H) 0.61 - 1.24 mg/dL   Calcium 9.0 8.9 - 10.3 mg/dL   Total Protein 7.8 6.5 - 8.1 g/dL    Albumin 2.8 (L) 3.5 - 5.0 g/dL   AST 25 15 - 41 U/L   ALT 21 17 - 63 U/L   Alkaline Phosphatase 70 38 - 126 U/L   Total Bilirubin 0.6 0.3 - 1.2 mg/dL   GFR calc non Af Amer 41 (L) >60 mL/min   GFR calc Af Amer 48 (L) >60 mL/min    Comment: (NOTE) The eGFR has been calculated using the CKD EPI equation. This calculation has not been validated in all clinical situations. eGFR's persistently <60 mL/min signify possible Chronic Kidney Disease.    Anion gap 13 5 - 15    Comment: Performed at [facility] [address], [place], [place] [address]  CBC with Differential     Status: Abnormal   Collection Time: 12/06/17 12:27 PM  Result Value Ref Range   WBC 17.7 (H) 4.0 - 10.5 K/uL   RBC 4.25 4.22 - 5.81 MIL/uL   Hemoglobin 13.2 13.0 - 17.0 g/dL   HCT 39.1 39.0 - 52.0 %   MCV 92.0 78.0 - 100.0 fL   MCH 31.1 26.0 - 34.0 pg   MCHC 33.8 30.0 - 36.0 g/dL   RDW 14.6 11.5 - 15.5 %   Platelets 225 150 - 400 K/uL   Neutrophils Relative % 88 %   Neutro Abs 15.7 (H) 1.7 - 7.7 K/uL   Lymphocytes Relative 4 %   Lymphs Abs 0.6 (L) 0.7 - 4.0 K/uL   Monocytes Relative 7 %   Monocytes Absolute 1.2 (H) 0.1 - 1.0 K/uL   Eosinophils Relative 0 %   Eosinophils Absolute 0.0 0.0 - 0.7 K/uL   Basophils Relative 0 %   Basophils Absolute 0.0 0.0 - 0.1 K/uL   Immature Granulocytes 1 %   Abs Immature Granulocytes 0.1 0.0 - 0.1 K/uL    Comment: Performed at [facility] [address], [place], [place] [address]  I-Stat CG4 Lactic Acid, ED     Status: None   Collection Time: 12/06/17 12:45 PM  Result Value Ref Range   Lactic Acid, Venous 1.74 0.5 - 1.9 mmol/L  Urinalysis, Routine w reflex microscopic     Status: Abnormal   Collection Time: 12/06/17  9:00 PM  Result Value Ref Range   Color, Urine YELLOW YELLOW   APPearance HAZY (A) CLEAR   Specific Gravity, Urine 1.017 1.005 - 1.030   pH 5.0 5.0 - 8.0   Glucose, UA NEGATIVE NEGATIVE mg/dL   Hgb urine dipstick MODERATE (A) NEGATIVE    Bilirubin Urine NEGATIVE NEGATIVE   Ketones, ur NEGATIVE NEGATIVE mg/dL   Protein, ur NEGATIVE NEGATIVE mg/dL   Nitrite NEGATIVE NEGATIVE   Leukocytes, UA NEGATIVE NEGATIVE   RBC / HPF 0-5 0 - 5 RBC/hpf  WBC, UA 0-5 0 - 5 WBC/hpf   Bacteria, UA NONE SEEN NONE SEEN   Squamous Epithelial / LPF 0-5 0 - 5   Mucus PRESENT    Hyaline Casts, UA PRESENT     Comment: Performed at [facility], [place] [address] [place], [place] [address]  HIV antibody (Routine Testing)     Status: None   Collection Time: 12/07/17  5:50 AM  Result Value Ref Range   HIV Screen 4th Generation wRfx Non Reactive Non Reactive    Comment: (NOTE) Performed At: [facility] [address] [place], [place] [id number] Rush Farmer MD [id number] Performed at [facility], [place] [address] [place], [place] [address]   CBC     Status: Abnormal   Collection Time: 12/07/17  5:50 AM  Result Value Ref Range   WBC 14.9 (H) 4.0 - 10.5 K/uL   RBC 3.72 (L) 4.22 - 5.81 MIL/uL   Hemoglobin 11.6 (L) 13.0 - 17.0 g/dL   HCT 35.2 (L) 39.0 - 52.0 %   MCV 94.6 78.0 - 100.0 fL   MCH 31.2 26.0 - 34.0 pg   MCHC 33.0 30.0 - 36.0 g/dL   RDW 15.2 11.5 - 15.5 %   Platelets 209 150 - 400 K/uL    Comment: Performed at [facility], [place] [address], [place], [place] [address]  Comprehensive metabolic panel     Status: Abnormal   Collection Time: 12/07/17  5:50 AM  Result Value Ref Range   Sodium 135 135 - 145 mmol/L   Potassium 4.2 3.5 - 5.1 mmol/L   Chloride 109 101 - 111 mmol/L   CO2 17 (L) 22 - 32 mmol/L   Glucose, Bld 96 65 - 99 mg/dL   BUN 51 (H) 6 - 20 mg/dL   Creatinine, Ser 1.21 0.61 - 1.24 mg/dL   Calcium 8.7 (L) 8.9 - 10.3 mg/dL   Total Protein 7.1 6.5 - 8.1 g/dL   Albumin 2.4 (L) 3.5 - 5.0 g/dL   AST 22 15 - 41 U/L   ALT 18 17 - 63 U/L   Alkaline Phosphatase 63 38 - 126 U/L   Total Bilirubin 0.6 0.3 - 1.2 mg/dL   GFR calc non Af Amer >60 >60 mL/min   GFR calc Af Amer >60 >60 mL/min     Comment: (NOTE) The eGFR has been calculated using the CKD EPI equation. This calculation has not been validated in all clinical situations. eGFR's persistently <60 mL/min signify possible Chronic Kidney Disease.    Anion gap 9 5 - 15    Comment: Performed at [facility] [address] [place], [place] [address]  Protime-INR     Status: None   Collection Time: 12/07/17  5:50 AM  Result Value Ref Range   Prothrombin Time 14.2 11.4 - 15.2 seconds   INR 1.11     Comment: Performed at [facility] [address] [place], [place] [address]  Sedimentation rate     Status: Abnormal   Collection Time: 12/07/17  8:08 AM  Result Value Ref Range   Sed Rate 78 (H) 0 - 16 mm/hr    Comment: Performed at [facility] [address], [place], [place] [address]  C-reactive protein     Status: Abnormal   Collection Time: 12/07/17  8:08 AM  Result Value Ref Range   CRP 13.5 (H) <1.0 mg/dL    Comment: Performed at [facility], [address], [place], [place] [address]  Prealbumin     Status: Abnormal   Collection Time: 12/07/17  8:08 AM  Result Value Ref Range   Prealbumin 10.8 (L) 18 - 38 mg/dL    Comment: Performed at [facility] [address] [place], [place] [address]  Hemoglobin A1c     Status: Abnormal   Collection Time: 12/07/17  8:08 AM  Result Value Ref Range   Hgb A1c MFr Bld 6.0 (H) 4.8 - 5.6 %    Comment: (NOTE) Pre diabetes:          5.7%-6.4% Diabetes:              >6.4% Glycemic control for   <7.0% adults with diabetes    Mean Plasma Glucose 125.5 mg/dL    Comment: Performed at [facility] [address], [place], [place] [address]  Sodium, urine, random     Status: None   Collection Time: 12/07/17  2:35 PM  Result Value Ref Range   Sodium, Ur <10 mmol/L    Comment: REPEATED TO VERIFY Performed at [facility], [place] [address], [place], [place] [address]   Creatinine, urine, random     Status: None   Collection Time:  12/07/17  2:35 PM  Result Value Ref Range   Creatinine, Urine 109.21 mg/dL    Comment: Performed at [facility] [address] [place], [place] [address]  Urea nitrogen, urine     Status: None   Collection Time: 12/07/17  2:35 PM  Result Value Ref Range   Urea Nitrogen, Ur 920 [address]. mg/dL    Comment: (NOTE) Performed At: [facility] [place], [place] [id number] Rush Farmer MD [id number] Performed at [facility], [place] [address] [place], [place] [address]   Basic metabolic panel     Status: Abnormal   Collection Time: 12/08/17  3:47 AM  Result Value Ref Range   Sodium 137 135 - 145 mmol/L   Potassium 4.6 3.5 - 5.1 mmol/L   Chloride 110 101 - 111 mmol/L   CO2 18 (L) 22 - 32 mmol/L   Glucose, Bld 102 (H) 65 - 99 mg/dL   BUN 41 (H) 6 - 20 mg/dL   Creatinine, Ser 1.20 0.61 - 1.24 mg/dL   Calcium 8.5 (L) 8.9 - 10.3 mg/dL   GFR calc non Af Amer >60 >60 mL/min   GFR calc Af Amer >60 >60 mL/min    Comment: (NOTE) The eGFR has been calculated using the CKD EPI equation. This calculation has not been validated in all clinical situations. eGFR's persistently <60 mL/min signify possible Chronic Kidney Disease.    Anion gap 9 5 - 15    Comment: Performed at [facility] [address] [place], [place] [address]  CBC     Status: Abnormal   Collection Time: 12/08/17  3:47 AM  Result Value Ref Range   WBC 12.2 (H) 4.0 - 10.5 K/uL   RBC 3.85 (L) 4.22 - 5.81 MIL/uL   Hemoglobin 12.1 (L) 13.0 - 17.0 g/dL   HCT 36.3 (L) 39.0 - 52.0 %   MCV 94.3 78.0 - 100.0 fL   MCH 31.4 26.0 - 34.0 pg   MCHC 33.3 30.0 - 36.0 g/dL   RDW 15.3 11.5 - 15.5 %   Platelets 200 150 - 400 K/uL    Comment: Performed at [facility], [place]  [address] [place], [place] [address]  Brain natriuretic peptide     Status: Abnormal   Collection Time: 12/08/17  3:47 AM  Result Value Ref Range   B Natriuretic Peptide 321.2 (H) 0.0 - 100.0 pg/mL    Comment:  Performed at [facility] [address] [place], [place] [address]  Basic metabolic panel     Status: Abnormal   Collection Time: 12/09/17  5:07 AM  Result Value Ref Range   Sodium 137 135 - 145 mmol/L   Potassium 4.6 3.5 - 5.1 mmol/L   Chloride 110 101 - 111 mmol/L   CO2 20 (L) 22 - 32 mmol/L   Glucose, Bld 111 (H) 65 - 99 mg/dL   BUN 28 (H) 6 - 20 mg/dL   Creatinine, Ser 0.94 0.61 - 1.24 mg/dL   Calcium 8.5 (L) 8.9 - 10.3 mg/dL   GFR calc non Af Amer >60 >60 mL/min   GFR calc Af Amer >60 >60 mL/min    Comment: (NOTE) The eGFR has been calculated using the CKD EPI equation. This calculation has not been validated in all clinical situations. eGFR's persistently <60 mL/min signify possible Chronic Kidney Disease.    Anion gap 7 5 - 15    Comment: Performed at [facility] [address], [place], [place] [address]  CBC     Status: Abnormal   Collection Time: 12/09/17  5:07 AM  Result Value Ref Range   WBC 10.7 (H) 4.0 - 10.5 K/uL   RBC 3.56 (L) 4.22 - 5.81 MIL/uL   Hemoglobin 11.2 (L) 13.0 - 17.0 g/dL   HCT 34.2 (L) 39.0 - 52.0 %   MCV 96.1 78.0 - 100.0 fL   MCH 31.5 26.0 - 34.0 pg   MCHC 32.7 30.0 - 36.0 g/dL   RDW 15.5 11.5 - 15.5 %   Platelets 180 150 - 400 K/uL    Comment: Performed at [facility], [place] [address] [place], [place] [address]  Basic metabolic panel     Status: Abnormal   Collection Time: 12/10/17  6:10 AM  Result Value Ref Range   Sodium 139 135 - 145 mmol/L   Potassium 4.4 3.5 - 5.1 mmol/L   Chloride 107 101 - 111 mmol/L   CO2 25 22 - 32 mmol/L   Glucose, Bld 110 (H) 65 - 99 mg/dL   BUN 24 (H) 6 - 20 mg/dL   Creatinine, Ser 1.05 0.61 - 1.24 mg/dL   Calcium 8.7 (L) 8.9 - 10.3 mg/dL   GFR calc non Af Amer >60 >60 mL/min   GFR calc Af Amer >60 >60 mL/min    Comment: (NOTE) The eGFR has been calculated using the CKD EPI equation. This calculation has not been validated in all clinical situations. eGFR's persistently <60 mL/min  signify possible Chronic Kidney Disease.    Anion gap 7 5 - 15    Comment: Performed at [facility] [address], [place], [place] [address]  CBC     Status: Abnormal   Collection Time: 12/10/17  6:10 AM  Result Value Ref Range   WBC 10.9 (H) 4.0 - 10.5 K/uL   RBC 3.64 (L) 4.22 - 5.81 MIL/uL   Hemoglobin 11.4 (L) 13.0 - 17.0 g/dL   HCT 35.0 (L) 39.0 - 52.0 %   MCV 96.2 78.0 - 100.0 fL   MCH 31.3 26.0 - 34.0 pg   MCHC 32.6 30.0 - 36.0 g/dL   RDW 15.7 (H)  11.5 - 15.5 %   Platelets 203 150 - 400 K/uL    Comment: Performed at [facility], [place] [address], [place], [place] [address]  Basic metabolic panel     Status: Abnormal   Collection Time: 12/11/17  5:54 AM  Result Value Ref Range   Sodium 136 135 - 145 mmol/L   Potassium 4.2 3.5 - 5.1 mmol/L   Chloride 98 (L) 101 - 111 mmol/L   CO2 28 22 - 32 mmol/L   Glucose, Bld 102 (H) 65 - 99 mg/dL   BUN 27 (H) 6 - 20 mg/dL   Creatinine, Ser 1.02 0.61 - 1.24 mg/dL   Calcium 8.6 (L) 8.9 - 10.3 mg/dL   GFR calc non Af Amer >60 >60 mL/min   GFR calc Af Amer >60 >60 mL/min    Comment: (NOTE) The eGFR has been calculated using the CKD EPI equation. This calculation has not been validated in all clinical situations. eGFR's persistently <60 mL/min signify possible Chronic Kidney Disease.    Anion gap 10 5 - 15    Comment: Performed at [facility] [address] [place], [place] [address]  CBC     Status: Abnormal   Collection Time: 12/11/17  5:54 AM  Result Value Ref Range   WBC 11.5 (H) 4.0 - 10.5 K/uL   RBC 3.66 (L) 4.22 - 5.81 MIL/uL   Hemoglobin 11.4 (L) 13.0 - 17.0 g/dL   HCT 34.8 (L) 39.0 - 52.0 %   MCV 95.1 78.0 - 100.0 fL   MCH 31.1 26.0 - 34.0 pg   MCHC 32.8 30.0 - 36.0 g/dL   RDW 15.3 11.5 - 15.5 %   Platelets 202 150 - 400 K/uL    Comment: Performed at [facility], [place] [address], [place], [place] [address]  Basic metabolic panel     Status: Abnormal   Collection Time: 12/12/17  5:05 AM   Result Value Ref Range   Sodium 134 (L) 135 - 145 mmol/L   Potassium 3.9 3.5 - 5.1 mmol/L   Chloride 100 (L) 101 - 111 mmol/L   CO2 28 22 - 32 mmol/L   Glucose, Bld 99 65 - 99 mg/dL   BUN 28 (H) 6 - 20 mg/dL   Creatinine, Ser 0.98 0.61 - 1.24 mg/dL   Calcium 8.6 (L) 8.9 - 10.3 mg/dL   GFR calc non Af Amer >60 >60 mL/min   GFR calc Af Amer >60 >60 mL/min    Comment: (NOTE) The eGFR has been calculated using the CKD EPI equation. This calculation has not been validated in all clinical situations. eGFR's persistently <60 mL/min signify possible Chronic Kidney Disease.    Anion gap 6 5 - 15    Comment: Performed at [facility] [address], [place], [place] [address]  CBC     Status: Abnormal   Collection Time: 12/12/17  5:05 AM  Result Value Ref Range   WBC 12.1 (H) 4.0 - 10.5 K/uL   RBC 3.84 (L) 4.22 - 5.81 MIL/uL   Hemoglobin 12.1 (L) 13.0 - 17.0 g/dL   HCT 36.4 (L) 39.0 - 52.0 %   MCV 94.8 78.0 - 100.0 fL   MCH 31.5 26.0 - 34.0 pg   MCHC 33.2 30.0 - 36.0 g/dL   RDW 15.3 11.5 - 15.5 %   Platelets 211 150 - 400 K/uL    Comment: Performed at [facility], [place],  [place] [address]  Protime-INR     Status: None   Collection Time: 12/12/17  5:05 AM  Result Value Ref Range   Prothrombin Time 13.2 11.4 - 15.2 seconds   INR 1.01     Comment: Performed at [facility], [place] [address], [place], [place] [address]  Magnesium     Status: Abnormal   Collection Time: 12/12/17  5:05 AM  Result Value Ref Range   Magnesium 1.6 (L) 1.7 - 2.4 mg/dL    Comment: Performed at [facility] [address] [place], [place] [address]  Surgical pcr screen     Status: None   Collection Time: 12/12/17  5:42 AM  Result Value Ref Range   MRSA, PCR NEGATIVE NEGATIVE   Staphylococcus aureus NEGATIVE NEGATIVE    Comment: (NOTE) The Xpert SA Assay (FDA approved for NASAL specimens in patients 33 years of age and older), is one component of a  comprehensive surveillance program. It is not intended to diagnose infection nor to guide or monitor treatment. Performed at [facility], [place] [address], [place], [place] [address]   POCT Activated clotting time     Status: None   Collection Time: 12/12/17  5:46 PM  Result Value Ref Range   Activated Clotting Time 136 seconds  Basic metabolic panel     Status: Abnormal   Collection Time: 12/13/17 10:21 AM  Result Value Ref Range   Sodium 134 (L) 135 - 145 mmol/L   Potassium 4.8 3.5 - 5.1 mmol/L    Comment: SLIGHT HEMOLYSIS   Chloride 100 (L) 101 - 111 mmol/L   CO2 23 22 - 32 mmol/L   Glucose, Bld 149 (H) 65 - 99 mg/dL   BUN 23 (H) 6 - 20 mg/dL   Creatinine, Ser 1.00 0.61 - 1.24 mg/dL   Calcium 8.3 (L) 8.9 - 10.3 mg/dL   GFR calc non Af Amer >60 >60 mL/min   GFR calc Af Amer >60 >60 mL/min    Comment: (NOTE) The eGFR has been calculated using the CKD EPI equation. This calculation has not been validated in all clinical situations. eGFR's persistently <60 mL/min signify possible Chronic Kidney Disease.    Anion gap 11 5 - 15    Comment: Performed at [facility] [address] [place], [place] [address]  CBC     Status: Abnormal   Collection Time: 12/13/17 10:21 AM  Result Value Ref Range   WBC 16.4 (H) 4.0 - 10.5 K/uL   RBC 3.88 (L) 4.22 - 5.81 MIL/uL   Hemoglobin 12.2 (L) 13.0 - 17.0 g/dL   HCT 37.4 (L) 39.0 - 52.0 %   MCV 96.4 78.0 - 100.0 fL   MCH 31.4 26.0 - 34.0 pg   MCHC 32.6 30.0 - 36.0 g/dL   RDW 15.3 11.5 - 15.5 %   Platelets 199 150 - 400 K/uL    Comment: Performed at [facility], [place] [address] [place], [place] [address]  Magnesium     Status: None   Collection Time: 12/13/17 10:21 AM  Result Value Ref Range   Magnesium 1.8 1.7 - 2.4 mg/dL    Comment: Performed at [facility] [address] [place], [place] [address]  Platelet inhibition p2y12 (Not at [facility])     Status: None   Collection Time: 12/13/17 10:21 AM  Result Value  Ref Range   Platelet Function  P2Y12 204 194 - 418 PRU  Comment:        The literature has shown a direct correlation of PRU values over 230 with higher risks of thrombotic events.  Lower PRU values are associated with platelet inhibition. Performed at [facility], [place] [address] [place], [place] [address]   Basic metabolic panel     Status: Abnormal   Collection Time: 12/14/17  4:26 AM  Result Value Ref Range   Sodium 136 135 - 145 mmol/L   Potassium 3.9 3.5 - 5.1 mmol/L    Comment: DELTA CHECK NOTED   Chloride 99 (L) 101 - 111 mmol/L   CO2 31 22 - 32 mmol/L   Glucose, Bld 96 65 - 99 mg/dL   BUN 17 6 - 20 mg/dL   Creatinine, Ser 0.91 0.61 - 1.24 mg/dL   Calcium 7.9 (L) 8.9 - 10.3 mg/dL   GFR calc non Af Amer >60 >60 mL/min   GFR calc Af Amer >60 >60 mL/min    Comment: (NOTE) The eGFR has been calculated using the CKD EPI equation. This calculation has not been validated in all clinical situations. eGFR's persistently <60 mL/min signify possible Chronic Kidney Disease.    Anion gap 6 5 - 15    Comment: Performed at [facility] [address], [place], [place] [address]  CBC     Status: Abnormal   Collection Time: 12/14/17  4:26 AM  Result Value Ref Range   WBC 11.1 (H) 4.0 - 10.5 K/uL   RBC 3.03 (L) 4.22 - 5.81 MIL/uL   Hemoglobin 9.5 (L) 13.0 - 17.0 g/dL    Comment: REPEATED TO VERIFY SPECIMEN CHECKED FOR CLOTS DELTA CHECK NOTED    HCT 28.6 (L) 39.0 - 52.0 %   MCV 94.4 78.0 - 100.0 fL   MCH 31.4 26.0 - 34.0 pg   MCHC 33.2 30.0 - 36.0 g/dL   RDW 15.1 11.5 - 15.5 %   Platelets 179 150 - 400 K/uL    Comment: Performed at [facility], [place] [address] [place], [place] [address]  Magnesium     Status: None   Collection Time: 12/14/17  4:26 AM  Result Value Ref Range   Magnesium 1.8 1.7 - 2.4 mg/dL    Comment: Performed at [facility] [address], [place], [place] [address]  Basic metabolic panel     Status: Abnormal   Collection  Time: 12/15/17  4:58 AM  Result Value Ref Range   Sodium 135 135 - 145 mmol/L   Potassium 3.9 3.5 - 5.1 mmol/L   Chloride 96 (L) 101 - 111 mmol/L   CO2 32 22 - 32 mmol/L   Glucose, Bld 114 (H) 65 - 99 mg/dL   BUN 18 6 - 20 mg/dL   Creatinine, Ser 0.91 0.61 - 1.24 mg/dL   Calcium 8.2 (L) 8.9 - 10.3 mg/dL   GFR calc non Af Amer >60 >60 mL/min   GFR calc Af Amer >60 >60 mL/min    Comment: (NOTE) The eGFR has been calculated using the CKD EPI equation. This calculation has not been validated in all clinical situations. eGFR's persistently <60 mL/min signify possible Chronic Kidney Disease.    Anion gap 7 5 - 15    Comment: Performed at [facility] [address], [place], [place] [address]  Magnesium     Status: None   Collection Time: 12/15/17  4:58 AM  Result Value Ref Range   Magnesium 1.8 1.7 -  2.4 mg/dL    Comment: Performed at [facility], [place] [address], [place], [place] [address]  CBC     Status: Abnormal   Collection Time: 12/15/17  4:58 AM  Result Value Ref Range   WBC 10.5 4.0 - 10.5 K/uL   RBC 3.19 (L) 4.22 - 5.81 MIL/uL   Hemoglobin 10.0 (L) 13.0 - 17.0 g/dL   HCT 30.5 (L) 39.0 - 52.0 %   MCV 95.6 78.0 - 100.0 fL   MCH 31.3 26.0 - 34.0 pg   MCHC 32.8 30.0 - 36.0 g/dL   RDW 15.1 11.5 - 15.5 %   Platelets 206 150 - 400 K/uL    Comment: Performed at [facility], [place] [address], [place], [place] [address]  Cortisol     Status: None   Collection Time: 12/15/17  4:58 AM  Result Value Ref Range   Cortisol, Plasma 16.6 ug/dL    Comment: (NOTE) AM    6.7 - 22.6 ug/dL PM   <10.0       ug/dL Performed at [facility] [address] [place], [place] [address]   TSH     Status: Abnormal   Collection Time: 12/15/17  4:58 AM  Result Value Ref Range   TSH 7.599 (H) 0.350 - 4.500 uIU/mL    Comment: Performed by a 3rd Generation assay with a functional sensitivity of <=0.01 uIU/mL. Performed at [facility], [place] [address]  [place],  [address]   C-reactive protein     Status: Abnormal   Collection Time: 01/14/18  5:12 PM  Result Value Ref Range   CRP 17.2 (H) <8.0 mg/L  Sedimentation rate     Status: Abnormal   Collection Time: 01/14/18  5:12 PM  Result Value Ref Range   Sed Rate 46 (H) 0 - 20 mm/h    Objective: There were no vitals filed for this visit.  General: Patient is awake, alert, oriented x 3 and in no acute distress.  Dermatology: Skin is warm and bilateral with significant ulceration down the entire posterior and lateral left leg with a mix of fibro-granular tissue and slough and a rolled border irregular fashion consistent with venous stasis ulcerations bilateral lower extremities, patient also has necrotic gangrene to the left first and second toes that is dry in nature and well demarcated.  To all wounds there is slight malodor that improves with cleansing the legs as well as venous hyperpigmentation and hyperemia that is mildly warm with diffuse swelling.  Vascular: Dorsalis Pedis pulse = 0/4 Bilateral,  Posterior Tibial pulse = 0/4 Bilateral,  Capillary Fill Time < 5 seconds except at left first and second toes.  Neurologic: Sensation is hypersensitive bilateral.  Musculosketal: There is mild pain with palpation to ulcerated areas with gangrene at left first and second toes that is dry in nature.   No results for input(s): GRAMSTAIN, LABORGA in the last 8760 hours.  Assessment and Plan:  Problem List Items Addressed This Visit      Cardiovascular and Mediastinum   PAD (peripheral artery disease) ([place])     Other   Gangrene of toe of left foot ([place]) - Primary    Other Visit Diagnoses    Venous ulcer-leg syndrome, bilateral (HCC)       Venous insufficiency (chronic) (peripheral)       Foot pain, bilateral         -Examined patient and discussed the progression of the wound and treatment  alternatives. -Discussed with patient treatment alternatives however at this time do not  recommend any type of surgical intervention patient must exhibit compliance and that the infection has improved by completing course of Augmentin as recommended by infectious disease -Also advised patient that even with surgery of a transmetatarsal amputation if there is no guarantee that he could heal this level of amputation and still may end up losing his leg or requiring a more proximal amputation patient expressed understanding I encouraged patient at this time to think about options and that I will further discuss this with Dr. Bridgett Larsson encouraged smoking cessation -Patient to continue with follow-up at wound care center for venous stasis ulcerations -Patient to return to office in 2 weeks for follow-up discussion possible planning of left TMA pending medical improvement or sooner if problems arise.  Landis Martins, DPM

## 2018-02-10 ENCOUNTER — Ambulatory Visit (INDEPENDENT_AMBULATORY_CARE_PROVIDER_SITE_OTHER): Payer: PRIVATE HEALTH INSURANCE | Admitting: Orthopedic Surgery

## 2018-02-14 ENCOUNTER — Encounter (INDEPENDENT_AMBULATORY_CARE_PROVIDER_SITE_OTHER): Payer: Self-pay | Admitting: Orthopedic Surgery

## 2018-02-14 ENCOUNTER — Ambulatory Visit: Payer: PRIVATE HEALTH INSURANCE | Admitting: Sports Medicine

## 2018-02-14 ENCOUNTER — Encounter: Payer: Self-pay | Admitting: Sports Medicine

## 2018-02-14 VITALS — BP 101/64 | HR 110 | Temp 97.7°F | Resp 16

## 2018-02-14 DIAGNOSIS — I96 Gangrene, not elsewhere classified: Secondary | ICD-10-CM

## 2018-02-14 DIAGNOSIS — I739 Peripheral vascular disease, unspecified: Secondary | ICD-10-CM | POA: Diagnosis not present

## 2018-02-14 DIAGNOSIS — I872 Venous insufficiency (chronic) (peripheral): Secondary | ICD-10-CM | POA: Diagnosis not present

## 2018-02-14 DIAGNOSIS — M79672 Pain in left foot: Secondary | ICD-10-CM

## 2018-02-14 DIAGNOSIS — I87013 Postthrombotic syndrome with ulcer of bilateral lower extremity: Secondary | ICD-10-CM

## 2018-02-14 DIAGNOSIS — I83029 Varicose veins of left lower extremity with ulcer of unspecified site: Secondary | ICD-10-CM

## 2018-02-14 DIAGNOSIS — M79671 Pain in right foot: Secondary | ICD-10-CM

## 2018-02-14 DIAGNOSIS — L97929 Non-pressure chronic ulcer of unspecified part of left lower leg with unspecified severity: Secondary | ICD-10-CM

## 2018-02-14 DIAGNOSIS — L97919 Non-pressure chronic ulcer of unspecified part of right lower leg with unspecified severity: Secondary | ICD-10-CM

## 2018-02-14 NOTE — Progress Notes (Signed)
Subjective: Peter Cochran is a 63 y.o. male patient seen in office for follow-up evaluation of gangrene of the left first and second toes and ulcerations to bilateral legs. Patient reports that his left leg was doing good but now it has started to swell and drain more with significant odor current patient is currently on a antibiotic for UTI and finished his antibiotic for his lower extremities a few days ago.  Patient states that he is still following with infectious disease and is still following with Dr. Bridgett Larsson from vascular.  But is trying to make a decision about what type of surgery he needs to have for his left lower extremity.  Patient denies vomiting fever chills or shortness of breath but does admit to an episode of nausea 2 to 3 days ago. Patient has no other pedal complaints at this time.    Patient Active Problem List   Diagnosis Date Noted  . Antibiotic long-term use   . AKI (acute kidney injury) ([place])   . Severe protein-calorie malnutrition ([place]) 12/12/2017  . Bilateral lower leg cellulitis   . Gangrene of toe of left foot ([place])   . Pressure injury of skin 12/07/2017  . Sepsis due to cellulitis ([place]) 12/06/2017  . Chronic systolic heart failure ([place]) 10/28/2017  . Dilated cardiomyopathy ([place]) 10/27/2017  . Mitral regurgitation 10/27/2017  . Elevated troponin 10/27/2017  . Elevated brain natriuretic peptide (BNP) level 10/27/2017  . PAD (peripheral artery disease) ([place]) 10/27/2017  . 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 Fatigue 10/22/2017   Current Outpatient Medications on File Prior to Visit  Medication Sig Dispense Refill  . amoxicillin-clavulanate (AUGMENTIN) 875-125 MG tablet Take 1 tablet by mouth 2 (two) times daily. 60 tablet 0  . aspirin EC 81 MG tablet Take 81 mg by mouth daily.    Marland Kitchen atorvastatin (LIPITOR) 40 MG tablet Take 1 tablet (40 mg total) by mouth daily. 30 tablet 11  . carvedilol (COREG) 3.125 MG tablet Take 1  tablet (3.125 mg total) by mouth 2 (two) times daily with a meal. 60 tablet 11  . clopidogrel (PLAVIX) 75 MG tablet Take 1 tablet (75 mg total) by mouth daily with breakfast. 30 tablet 0  . DULoxetine (CYMBALTA) 30 MG capsule Take 30 mg by mouth daily.    . furosemide (LASIX) 20 MG tablet Take 1 tablet (20 mg total) by mouth 3 (three) times a week. On Monday Wednesday friday 90 tablet 3  . levothyroxine (SYNTHROID, LEVOTHROID) 100 MCG tablet Take 100 mcg by mouth daily before breakfast.     . Multiple Vitamin (MULTIVITAMIN WITH MINERALS) TABS tablet Take 1 tablet by mouth daily. 30 tablet 0  . sacubitril-valsartan (ENTRESTO) 24-26 MG Take 1 tablet by mouth 3 (three) times a week. 15 tablet 3  . spironolactone (ALDACTONE) 25 MG tablet Take 1 tablet (25 mg total) by mouth daily. 90 tablet 3  . tamsulosin (FLOMAX) 0.4 MG CAPS capsule Take 1 capsule (0.4 mg total) by mouth daily after breakfast. 30 capsule 0  . traMADol (ULTRAM) 50 MG tablet Take by mouth every 4 (four) hours.    . triamcinolone cream (KENALOG) 0.1 % Apply 1 application topically 2 (two) times daily.     No current facility-administered medications on file prior to visit.    Allergies  Allergen Reactions  . Oxytetracycline Itching and Swelling    Facial swelling  . Sulfa Antibiotics Rash    Recent Results (from the past 2160  hour(s))  Comprehensive metabolic panel     Status: Abnormal   Collection Time: 12/06/17 12:27 PM  Result Value Ref Range   Sodium 135 135 - 145 mmol/L   Potassium 4.3 3.5 - 5.1 mmol/L   Chloride 104 101 - 111 mmol/L   CO2 18 (L) 22 - 32 mmol/L   Glucose, Bld 102 (H) 65 - 99 mg/dL   BUN 60 (H) 6 - 20 mg/dL   Creatinine, Ser 1.73 (H) 0.61 - 1.24 mg/dL   Calcium 9.0 8.9 - 10.3 mg/dL   Total Protein 7.8 6.5 - 8.1 g/dL   Albumin 2.8 (L) 3.5 - 5.0 g/dL   AST 25 15 - 41 U/L   ALT 21 17 - 63 U/L   Alkaline Phosphatase 70 38 - 126 U/L   Total Bilirubin 0.6 0.3 - 1.2 mg/dL   GFR calc non Af Amer 41 (L)  >60 mL/min   GFR calc Af Amer 48 (L) >60 mL/min    Comment: (NOTE) The eGFR has been calculated using the CKD EPI equation. This calculation has not been validated in all clinical situations. eGFR's persistently <60 mL/min signify possible Chronic Kidney Disease.    Anion gap 13 5 - 15    Comment: Performed at [facility] [address], [place], [place] [address]  CBC with Differential     Status: Abnormal   Collection Time: 12/06/17 12:27 PM  Result Value Ref Range   WBC 17.7 (H) 4.0 - 10.5 K/uL   RBC 4.25 4.22 - 5.81 MIL/uL   Hemoglobin 13.2 13.0 - 17.0 g/dL   HCT 39.1 39.0 - 52.0 %   MCV 92.0 78.0 - 100.0 fL   MCH 31.1 26.0 - 34.0 pg   MCHC 33.8 30.0 - 36.0 g/dL   RDW 14.6 11.5 - 15.5 %   Platelets 225 150 - 400 K/uL   Neutrophils Relative % 88 %   Neutro Abs 15.7 (H) 1.7 - 7.7 K/uL   Lymphocytes Relative 4 %   Lymphs Abs 0.6 (L) 0.7 - 4.0 K/uL   Monocytes Relative 7 %   Monocytes Absolute 1.2 (H) 0.1 - 1.0 K/uL   Eosinophils Relative 0 %   Eosinophils Absolute 0.0 0.0 - 0.7 K/uL   Basophils Relative 0 %   Basophils Absolute 0.0 0.0 - 0.1 K/uL   Immature Granulocytes 1 %   Abs Immature Granulocytes 0.1 0.0 - 0.1 K/uL    Comment: Performed at [facility] [address] [place], [place] [address]  I-Stat CG4 Lactic Acid, ED     Status: None   Collection Time: 12/06/17 12:45 PM  Result Value Ref Range   Lactic Acid, Venous 1.74 0.5 - 1.9 mmol/L  Urinalysis, Routine w reflex microscopic     Status: Abnormal   Collection Time: 12/06/17  9:00 PM  Result Value Ref Range   Color, Urine YELLOW YELLOW   APPearance HAZY (A) CLEAR   Specific Gravity, Urine 1.017 1.005 - 1.030   pH 5.0 5.0 - 8.0   Glucose, UA NEGATIVE NEGATIVE mg/dL   Hgb urine dipstick MODERATE (A) NEGATIVE   Bilirubin Urine NEGATIVE NEGATIVE   Ketones, ur NEGATIVE NEGATIVE mg/dL   Protein, ur NEGATIVE NEGATIVE mg/dL   Nitrite NEGATIVE NEGATIVE   Leukocytes, UA NEGATIVE NEGATIVE   RBC /  HPF 0-5 0 - 5 RBC/hpf   WBC, UA 0-5 0 - 5 WBC/hpf   Bacteria, UA NONE SEEN NONE SEEN   Squamous Epithelial /  LPF 0-5 0 - 5   Mucus PRESENT    Hyaline Casts, UA PRESENT     Comment: Performed at [facility], [place] [address] [place], [place] [address]  HIV antibody (Routine Testing)     Status: None   Collection Time: 12/07/17  5:50 AM  Result Value Ref Range   HIV Screen 4th Generation wRfx Non Reactive Non Reactive    Comment: (NOTE) Performed At: [facility] [address] [place], [place] [id number] Rush Farmer MD [id number] Performed at [facility], [place] [address] [place], [place] [address]   CBC     Status: Abnormal   Collection Time: 12/07/17  5:50 AM  Result Value Ref Range   WBC 14.9 (H) 4.0 - 10.5 K/uL   RBC 3.72 (L) 4.22 - 5.81 MIL/uL   Hemoglobin 11.6 (L) 13.0 - 17.0 g/dL   HCT 35.2 (L) 39.0 - 52.0 %   MCV 94.6 78.0 - 100.0 fL   MCH 31.2 26.0 - 34.0 pg   MCHC 33.0 30.0 - 36.0 g/dL   RDW 15.2 11.5 - 15.5 %   Platelets 209 150 - 400 K/uL    Comment: Performed at [facility], [place] [address] [place], [place] [address]  Comprehensive metabolic panel     Status: Abnormal   Collection Time: 12/07/17  5:50 AM  Result Value Ref Range   Sodium 135 135 - 145 mmol/L   Potassium 4.2 3.5 - 5.1 mmol/L   Chloride 109 101 - 111 mmol/L   CO2 17 (L) 22 - 32 mmol/L   Glucose, Bld 96 65 - 99 mg/dL   BUN 51 (H) 6 - 20 mg/dL   Creatinine, Ser 1.21 0.61 - 1.24 mg/dL   Calcium 8.7 (L) 8.9 - 10.3 mg/dL   Total Protein 7.1 6.5 - 8.1 g/dL   Albumin 2.4 (L) 3.5 - 5.0 g/dL   AST 22 15 - 41 U/L   ALT 18 17 - 63 U/L   Alkaline Phosphatase 63 38 - 126 U/L   Total Bilirubin 0.6 0.3 - 1.2 mg/dL   GFR calc non Af Amer >60 >60 mL/min   GFR calc Af Amer >60 >60 mL/min    Comment: (NOTE) The eGFR has been calculated using the CKD EPI equation. This calculation has not been validated in all clinical situations. eGFR's persistently <60 mL/min signify  possible Chronic Kidney Disease.    Anion gap 9 5 - 15    Comment: Performed at [facility] [address], [place], [place] [address]  Protime-INR     Status: None   Collection Time: 12/07/17  5:50 AM  Result Value Ref Range   Prothrombin Time 14.2 11.4 - 15.2 seconds   INR 1.11     Comment: Performed at [facility] [address], [place], [place] [address]  Sedimentation rate     Status: Abnormal   Collection Time: 12/07/17  8:08 AM  Result Value Ref Range   Sed Rate 78 (H) 0 - 16 mm/hr    Comment: Performed at [facility] [address] [place], [place] [address]  C-reactive protein     Status: Abnormal   Collection Time: 12/07/17  8:08 AM  Result Value Ref Range   CRP 13.5 (H) <1.0 mg/dL    Comment: Performed at [facility] [address] [place], [place] [address]  Prealbumin     Status: Abnormal   Collection Time:  12/07/17  8:08 AM  Result Value Ref Range   Prealbumin 10.8 (L) 18 - 38 mg/dL    Comment: Performed at [facility] [address] [place], [place] [address]  Hemoglobin A1c     Status: Abnormal   Collection Time: 12/07/17  8:08 AM  Result Value Ref Range   Hgb A1c MFr Bld 6.0 (H) 4.8 - 5.6 %    Comment: (NOTE) Pre diabetes:          5.7%-6.4% Diabetes:              >6.4% Glycemic control for   <7.0% adults with diabetes    Mean Plasma Glucose 125.5 mg/dL    Comment: Performed at [facility] [address] [place], [place] [address]  Sodium, urine, random     Status: None   Collection Time: 12/07/17  2:35 PM  Result Value Ref Range   Sodium, Ur <10 mmol/L    Comment: REPEATED TO VERIFY Performed at [facility], [place] [address] [place], [place] [address]   Creatinine, urine, random     Status: None   Collection Time: 12/07/17  2:35 PM  Result Value Ref Range   Creatinine, Urine 109.21 mg/dL    Comment: Performed at [facility] [address], [place], [place] [address]  Urea nitrogen,  urine     Status: None   Collection Time: 12/07/17  2:35 PM  Result Value Ref Range   Urea Nitrogen, Ur 920 Not Estab. mg/dL    Comment: (NOTE) Performed At: [facility] [place], [place] [id number] Rush Farmer MD [id number] Performed at [facility], [place] [address] [place], [place] [address]   Basic metabolic panel     Status: Abnormal   Collection Time: 12/08/17  3:47 AM  Result Value Ref Range   Sodium 137 135 - 145 mmol/L   Potassium 4.6 3.5 - 5.1 mmol/L   Chloride 110 101 - 111 mmol/L   CO2 18 (L) 22 - 32 mmol/L   Glucose, Bld 102 (H) 65 - 99 mg/dL   BUN 41 (H) 6 - 20 mg/dL   Creatinine, Ser 1.20 0.61 - 1.24 mg/dL   Calcium 8.5 (L) 8.9 - 10.3 mg/dL   GFR calc non Af Amer >60 >60 mL/min   GFR calc Af Amer >60 >60 mL/min    Comment: (NOTE) The eGFR has been calculated using the CKD EPI equation. This calculation has not been validated in all clinical situations. eGFR's persistently <60 mL/min signify possible Chronic Kidney Disease.    Anion gap 9 5 - 15    Comment: Performed at [facility] [address] [place], [place] [address]  CBC     Status: Abnormal   Collection Time: 12/08/17  3:47 AM  Result Value Ref Range   WBC 12.2 (H) 4.0 - 10.5 K/uL   RBC 3.85 (L) 4.22 - 5.81 MIL/uL   Hemoglobin 12.1 (L) 13.0 - 17.0 g/dL   HCT 36.3 (L) 39.0 - 52.0 %   MCV 94.3 78.0 - 100.0 fL   MCH 31.4 26.0 - 34.0 pg   MCHC 33.3 30.0 - 36.0 g/dL   RDW 15.3 11.5 - 15.5 %   Platelets 200 150 - 400 K/uL    Comment: Performed at [facility], [place] [address] [place], [place] [address]  Brain natriuretic peptide     Status: Abnormal   Collection Time: 12/08/17  3:47 AM  Result Value Ref Range   B Natriuretic Peptide 321.2 (H) 0.0 - 100.0 pg/mL    Comment: Performed at [facility] [address] [place], [place] [address]  Basic metabolic panel     Status: Abnormal   Collection Time: 12/09/17  5:07 AM  Result Value Ref Range    Sodium 137 135 - 145 mmol/L   Potassium 4.6 3.5 - 5.1 mmol/L   Chloride 110 101 - 111 mmol/L   CO2 20 (L) 22 - 32 mmol/L   Glucose, Bld 111 (H) 65 - 99 mg/dL   BUN 28 (H) 6 - 20 mg/dL   Creatinine, Ser 0.94 0.61 - 1.24 mg/dL   Calcium 8.5 (L) 8.9 - 10.3 mg/dL   GFR calc non Af Amer >60 >60 mL/min   GFR calc Af Amer >60 >60 mL/min    Comment: (NOTE) The eGFR has been calculated using the CKD EPI equation. This calculation has not been validated in all clinical situations. eGFR's persistently <60 mL/min signify possible Chronic Kidney Disease.    Anion gap 7 5 - 15    Comment: Performed at [facility] [address] [place], [place] [address]  CBC     Status: Abnormal   Collection Time: 12/09/17  5:07 AM  Result Value Ref Range   WBC 10.7 (H) 4.0 - 10.5 K/uL   RBC 3.56 (L) 4.22 - 5.81 MIL/uL   Hemoglobin 11.2 (L) 13.0 - 17.0 g/dL   HCT 34.2 (L) 39.0 - 52.0 %   MCV 96.1 78.0 - 100.0 fL   MCH 31.5 26.0 - 34.0 pg   MCHC 32.7 30.0 - 36.0 g/dL   RDW 15.5 11.5 - 15.5 %   Platelets 180 150 - 400 K/uL    Comment: Performed at [facility], [place] [address], [place], [place] [address]  Basic metabolic panel     Status: Abnormal   Collection Time: 12/10/17  6:10 AM  Result Value Ref Range   Sodium 139 135 - 145 mmol/L   Potassium 4.4 3.5 - 5.1 mmol/L   Chloride 107 101 - 111 mmol/L   CO2 25 22 - 32 mmol/L   Glucose, Bld 110 (H) 65 - 99 mg/dL   BUN 24 (H) 6 - 20 mg/dL   Creatinine, Ser 1.05 0.61 - 1.24 mg/dL   Calcium 8.7 (L) 8.9 - 10.3 mg/dL   GFR calc non Af Amer >60 >60 mL/min   GFR calc Af Amer >60 >60 mL/min    Comment: (NOTE) The eGFR has been calculated using the CKD EPI equation. This calculation has not been validated in all clinical situations. eGFR's persistently <60 mL/min signify possible Chronic Kidney Disease.    Anion gap 7 5 - 15    Comment: Performed at [facility] [address], [place] [address]  CBC     Status: Abnormal    Collection Time: 12/10/17  6:10 AM  Result Value Ref Range   WBC 10.9 (H) 4.0 - 10.5 K/uL   RBC 3.64 (L) 4.22 - 5.81 MIL/uL   Hemoglobin 11.4 (L) 13.0 - 17.0 g/dL   HCT 35.0 (L) 39.0 - 52.0 %   MCV 96.2 78.0 - 100.0 fL   MCH 31.3 26.0 - 34.0 pg   MCHC 32.6 30.0 - 36.0 g/dL   RDW 15.7 (H) 11.5 - 15.5 %   Platelets 203 150 - 400 K/uL    Comment: Performed at [facility], [place] [address] [place], [place] [address]  Basic metabolic panel     Status: Abnormal   Collection Time: 12/11/17  5:54 AM  Result Value Ref Range   Sodium 136 135 - 145 mmol/L   Potassium 4.2 3.5 - 5.1 mmol/L   Chloride 98 (L) 101 - 111 mmol/L   CO2 28 22 - 32 mmol/L   Glucose, Bld 102 (H) 65 - 99 mg/dL   BUN 27 (H) 6 - 20 mg/dL   Creatinine, Ser 1.02 0.61 - 1.24 mg/dL   Calcium 8.6 (L) 8.9 - 10.3 mg/dL   GFR calc non Af Amer >60 >60 mL/min   GFR calc Af Amer >60 >60 mL/min    Comment: (NOTE) The eGFR has been calculated using the CKD EPI equation. This calculation has not been validated in all clinical situations. eGFR's persistently <60 mL/min signify possible Chronic Kidney Disease.    Anion gap 10 5 - 15    Comment: Performed at [facility] [address], [place], [place] [address]  CBC     Status: Abnormal   Collection Time: 12/11/17  5:54 AM  Result Value Ref Range   WBC 11.5 (H) 4.0 - 10.5 K/uL   RBC 3.66 (L) 4.22 - 5.81 MIL/uL   Hemoglobin 11.4 (L) 13.0 - 17.0 g/dL   HCT 34.8 (L) 39.0 - 52.0 %   MCV 95.1 78.0 - 100.0 fL   MCH 31.1 26.0 - 34.0 pg   MCHC 32.8 30.0 - 36.0 g/dL   RDW 15.3 11.5 - 15.5 %   Platelets 202 150 - 400 K/uL    Comment: Performed at [facility], [place] [address], [place], [place] [address]  Basic metabolic panel     Status: Abnormal   Collection Time: 12/12/17  5:05 AM  Result Value Ref Range   Sodium 134 (L) 135 - 145 mmol/L   Potassium 3.9 3.5 - 5.1 mmol/L   Chloride 100 (L) 101 - 111 mmol/L   CO2 28 22 - 32 mmol/L   Glucose, Bld 99 65 - 99 mg/dL    BUN 28 (H) 6 - 20 mg/dL   Creatinine, Ser 0.98 0.61 - 1.24 mg/dL   Calcium 8.6 (L) 8.9 - 10.3 mg/dL   GFR calc non Af Amer >60 >60 mL/min   GFR calc Af Amer >60 >60 mL/min    Comment: (NOTE) The eGFR has been calculated using the CKD EPI equation. This calculation has not been validated in all clinical situations. eGFR's persistently <60 mL/min signify possible Chronic Kidney Disease.    Anion gap 6 5 - 15    Comment: Performed at [facility] [address], [place], [place] [address]  CBC     Status: Abnormal   Collection Time: 12/12/17  5:05 AM  Result Value Ref Range   WBC 12.1 (H) 4.0 - 10.5 K/uL   RBC 3.84 (L) 4.22 - 5.81 MIL/uL   Hemoglobin 12.1 (L) 13.0 - 17.0 g/dL   HCT 36.4 (L) 39.0 - 52.0 %   MCV 94.8 78.0 - 100.0 fL   MCH 31.5 26.0 - 34.0 pg   MCHC 33.2 30.0 - 36.0 g/dL   RDW 15.3 11.5 - 15.5 %   Platelets 211 150 - 400 K/uL    Comment: Performed at [facility], [place] [address], [place], [place] [address]  Protime-INR     Status: None   Collection Time: 12/12/17  5:05 AM  Result  Value Ref Range   Prothrombin Time 13.2 11.4 - 15.2 seconds   INR 1.01     Comment: Performed at [facility], [place] [address], [place], [place] [address]  Magnesium     Status: Abnormal   Collection Time: 12/12/17  5:05 AM  Result Value Ref Range   Magnesium 1.6 (L) 1.7 - 2.4 mg/dL    Comment: Performed at [facility] [address], [place], [place] [address]  Surgical pcr screen     Status: None   Collection Time: 12/12/17  5:42 AM  Result Value Ref Range   MRSA, PCR NEGATIVE NEGATIVE   Staphylococcus aureus NEGATIVE NEGATIVE    Comment: (NOTE) The Xpert SA Assay (FDA approved for NASAL specimens in patients 49 years of age and older), is one component of a comprehensive surveillance program. It is not intended to diagnose infection nor to guide or monitor treatment. Performed at [facility], [place] [address] [place], [place] [address]   POCT  Activated clotting time     Status: None   Collection Time: 12/12/17  5:46 PM  Result Value Ref Range   Activated Clotting Time 136 seconds  Basic metabolic panel     Status: Abnormal   Collection Time: 12/13/17 10:21 AM  Result Value Ref Range   Sodium 134 (L) 135 - 145 mmol/L   Potassium 4.8 3.5 - 5.1 mmol/L    Comment: SLIGHT HEMOLYSIS   Chloride 100 (L) 101 - 111 mmol/L   CO2 23 22 - 32 mmol/L   Glucose, Bld 149 (H) 65 - 99 mg/dL   BUN 23 (H) 6 - 20 mg/dL   Creatinine, Ser 1.00 0.61 - 1.24 mg/dL   Calcium 8.3 (L) 8.9 - 10.3 mg/dL   GFR calc non Af Amer >60 >60 mL/min   GFR calc Af Amer >60 >60 mL/min    Comment: (NOTE) The eGFR has been calculated using the CKD EPI equation. This calculation has not been validated in all clinical situations. eGFR's persistently <60 mL/min signify possible Chronic Kidney Disease.    Anion gap 11 5 - 15    Comment: Performed at [facility] [address] [place], [place] [address]  CBC     Status: Abnormal   Collection Time: 12/13/17 10:21 AM  Result Value Ref Range   WBC 16.4 (H) 4.0 - 10.5 K/uL   RBC 3.88 (L) 4.22 - 5.81 MIL/uL   Hemoglobin 12.2 (L) 13.0 - 17.0 g/dL   HCT 37.4 (L) 39.0 - 52.0 %   MCV 96.4 78.0 - 100.0 fL   MCH 31.4 26.0 - 34.0 pg   MCHC 32.6 30.0 - 36.0 g/dL   RDW 15.3 11.5 - 15.5 %   Platelets 199 150 - 400 K/uL    Comment: Performed at [facility], [place] [address] [place], [place] [address]  Magnesium     Status: None   Collection Time: 12/13/17 10:21 AM  Result Value Ref Range   Magnesium 1.8 1.7 - 2.4 mg/dL    Comment: Performed at [facility] [address], [place], [place] [address]  Platelet inhibition p2y12 (Not at [facility])     Status: None   Collection Time: 12/13/17 10:21 AM  Result Value Ref Range   Platelet Function  P2Y12 204 194 - 418 PRU    Comment:        The literature has shown a direct correlation of PRU values over 230  with higher risks of thrombotic events.  Lower  PRU values are associated with platelet inhibition. Performed at [facility], [place] [address] [place], [place] [address]   Basic metabolic panel     Status: Abnormal   Collection Time: 12/14/17  4:26 AM  Result Value Ref Range   Sodium 136 135 - 145 mmol/L   Potassium 3.9 3.5 - 5.1 mmol/L    Comment: DELTA CHECK NOTED   Chloride 99 (L) 101 - 111 mmol/L   CO2 31 22 - 32 mmol/L   Glucose, Bld 96 65 - 99 mg/dL   BUN 17 6 - 20 mg/dL   Creatinine, Ser 0.91 0.61 - 1.24 mg/dL   Calcium 7.9 (L) 8.9 - 10.3 mg/dL   GFR calc non Af Amer >60 >60 mL/min   GFR calc Af Amer >60 >60 mL/min    Comment: (NOTE) The eGFR has been calculated using the CKD EPI equation. This calculation has not been validated in all clinical situations. eGFR's persistently <60 mL/min signify possible Chronic Kidney Disease.    Anion gap 6 5 - 15    Comment: Performed at [facility] [address], [place], [place] [address]  CBC     Status: Abnormal   Collection Time: 12/14/17  4:26 AM  Result Value Ref Range   WBC 11.1 (H) 4.0 - 10.5 K/uL   RBC 3.03 (L) 4.22 - 5.81 MIL/uL   Hemoglobin 9.5 (L) 13.0 - 17.0 g/dL    Comment: REPEATED TO VERIFY SPECIMEN CHECKED FOR CLOTS DELTA CHECK NOTED    HCT 28.6 (L) 39.0 - 52.0 %   MCV 94.4 78.0 - 100.0 fL   MCH 31.4 26.0 - 34.0 pg   MCHC 33.2 30.0 - 36.0 g/dL   RDW 15.1 11.5 - 15.5 %   Platelets 179 150 - 400 K/uL    Comment: Performed at [facility], [place] [address], [place], [place] [address]  Magnesium     Status: None   Collection Time: 12/14/17  4:26 AM  Result Value Ref Range   Magnesium 1.8 1.7 - 2.4 mg/dL    Comment: Performed at [facility] [address], [place], [place] [address]  Basic metabolic panel     Status: Abnormal   Collection Time: 12/15/17  4:58 AM  Result Value Ref Range   Sodium 135 135 - 145 mmol/L   Potassium 3.9 3.5 - 5.1 mmol/L   Chloride 96 (L) 101 - 111 mmol/L   CO2 32 22 - 32 mmol/L   Glucose, Bld 114  (H) 65 - 99 mg/dL   BUN 18 6 - 20 mg/dL   Creatinine, Ser 0.91 0.61 - 1.24 mg/dL   Calcium 8.2 (L) 8.9 - 10.3 mg/dL   GFR calc non Af Amer >60 >60 mL/min   GFR calc Af Amer >60 >60 mL/min    Comment: (NOTE) The eGFR has been calculated using the CKD EPI equation. This calculation has not been validated in all clinical situations. eGFR's persistently <60 mL/min signify possible Chronic Kidney Disease.    Anion gap 7 5 - 15    Comment: Performed at [facility] [address], [place], [place] [address]  Magnesium     Status: None   Collection Time: 12/15/17  4:58 AM  Result Value Ref Range   Magnesium 1.8 1.7 - 2.4 mg/dL    Comment: Performed at [facility] [address], [place], [place] [address]  CBC     Status: Abnormal   Collection Time: 12/15/17  4:58 AM  Result Value Ref Range   WBC 10.5 4.0 - 10.5 K/uL   RBC 3.19 (L) 4.22 - 5.81 MIL/uL   Hemoglobin 10.0 (L) 13.0 - 17.0 g/dL   HCT 30.5 (L) 39.0 - 52.0 %   MCV 95.6 78.0 - 100.0 fL   MCH 31.3 26.0 - 34.0 pg   MCHC 32.8 30.0 - 36.0 g/dL   RDW 15.1 11.5 - 15.5 %   Platelets 206 150 - 400 K/uL    Comment: Performed at [facility], [place] [address] [place], [place] [address]  Cortisol     Status: None   Collection Time: 12/15/17  4:58 AM  Result Value Ref Range   Cortisol, Plasma 16.6 ug/dL    Comment: (NOTE) AM    6.7 - 22.6 ug/dL PM   <10.0       ug/dL Performed at [facility] [address], [place], [place] [address]   TSH     Status: Abnormal   Collection Time: 12/15/17  4:58 AM  Result Value Ref Range   TSH 7.599 (H) 0.350 - 4.500 uIU/mL    Comment: Performed by a 3rd Generation assay with a functional sensitivity of <=0.01 uIU/mL. Performed at [facility], [place] [address] [place], [place] [address]   C-reactive protein     Status: Abnormal   Collection Time: 01/14/18  5:12 PM  Result Value Ref Range   CRP 17.2 (H) <8.0 mg/L  Sedimentation rate     Status: Abnormal    Collection Time: 01/14/18  5:12 PM  Result Value Ref Range   Sed Rate 46 (H) 0 - 20 mm/h    Objective: There were no vitals filed for this visit.  General: Patient is awake, alert, oriented x 3 and in no acute distress.  Dermatology: Skin is warm and bilateral with significant ulceration down the entire posterior and lateral left leg with a mix of fibro-granular tissue and slough and a rolled border irregular fashion consistent with venous stasis ulcerations bilateral lower extremities, patient also has necrotic gangrene to the left first and second toes that is dry in nature and well demarcated.  To all wounds there is odor and active serous and green drainage.  Vascular: Dorsalis Pedis pulse = 0/4 Bilateral,  Posterior Tibial pulse = 0/4 Bilateral,  Capillary Fill Time < 5 seconds except at left first and second toes.  Neurologic: Sensation is hypersensitive bilateral.  Musculosketal: There is mild pain with palpation to ulcerated areas bilateral legs with gangrene at left first and second toes that is dry in nature.   No results for input(s): GRAMSTAIN, LABORGA in the last 8760 hours.  Assessment and Plan:  Problem List Items Addressed This Visit      Cardiovascular and Mediastinum   PAD (peripheral artery disease) ([place])     Other   Gangrene of toe of left foot ([place]) - Primary    Other Visit Diagnoses    Venous ulcer-leg syndrome, bilateral (HCC)       Venous insufficiency (chronic) (peripheral)       Foot pain, bilateral         -Examined patient and discussed the progression of the wound and treatment alternatives. -Discussed with patient likelihood of any type of foot surgery that I can perform may not heal and may progress still to a further more proximal amputation especially since his lower leg  wounds continue to drain and weap and be infected.  Patient expressed understanding and states that he will follow-up with the general or orthopedic surgery and would like to have  a below-knee amputation on the left.  -Continue with infectious disease follow-up -Continue with wound care center management of lower extremity wounds -continue with vascular follow-up with Dr. Bridgett Larsson -Return to office as needed or sooner if problems or issues arise  Landis Martins, DPM

## 2018-02-19 ENCOUNTER — Encounter: Payer: Self-pay | Admitting: Internal Medicine

## 2018-02-19 ENCOUNTER — Ambulatory Visit (INDEPENDENT_AMBULATORY_CARE_PROVIDER_SITE_OTHER): Payer: PRIVATE HEALTH INSURANCE | Admitting: Internal Medicine

## 2018-02-19 ENCOUNTER — Encounter (INDEPENDENT_AMBULATORY_CARE_PROVIDER_SITE_OTHER): Payer: Self-pay | Admitting: Family

## 2018-02-19 ENCOUNTER — Ambulatory Visit (INDEPENDENT_AMBULATORY_CARE_PROVIDER_SITE_OTHER): Payer: PRIVATE HEALTH INSURANCE | Admitting: Family

## 2018-02-19 VITALS — BP 109/70 | HR 109 | Temp 98.3°F

## 2018-02-19 VITALS — Ht 71.0 in | Wt 198.0 lb

## 2018-02-19 DIAGNOSIS — I96 Gangrene, not elsewhere classified: Secondary | ICD-10-CM | POA: Diagnosis not present

## 2018-02-19 DIAGNOSIS — I739 Peripheral vascular disease, unspecified: Secondary | ICD-10-CM | POA: Diagnosis not present

## 2018-02-19 DIAGNOSIS — Z716 Tobacco abuse counseling: Secondary | ICD-10-CM | POA: Diagnosis not present

## 2018-02-19 DIAGNOSIS — I872 Venous insufficiency (chronic) (peripheral): Secondary | ICD-10-CM | POA: Diagnosis not present

## 2018-02-19 DIAGNOSIS — L03116 Cellulitis of left lower limb: Secondary | ICD-10-CM | POA: Diagnosis not present

## 2018-02-19 DIAGNOSIS — L97922 Non-pressure chronic ulcer of unspecified part of left lower leg with fat layer exposed: Secondary | ICD-10-CM | POA: Diagnosis not present

## 2018-02-19 MED ORDER — AMOXICILLIN-POT CLAVULANATE 875-125 MG PO TABS: 1.0000 | ORAL_TABLET | Freq: Two times a day (BID) | ORAL | 0 refills | Status: DC

## 2018-02-19 NOTE — Progress Notes (Signed)
Patient ID: Peter Cochran, male   DOB: 02/14/1956, 62 y.o.   MRN: [id number]  HPI Peter Cochran is a 62yo M with hisotry of severe vascular disease, s/p revascularization by dr Imogene Burn has improved arterial flow but venous stasis is a still an issue for him. He states that he is able to wear compression-silver socks from dr duda on his right leg but not tolerating it on his left leg. He was able to quit smoking for roughly 14 days until last week when he started to have bloody urine, passing clots and purulent drainage from his foley. He was treated for uti. He states that he was worried about kidney failure, since he has other family members/friends with kidney failure -->which triggered him to start smoking again at 1ppd.  In addition to his UTI symptoms, he also started to have increasing drainage, swelling to his left lower extremities. He is interested in doing a BKA. Missed appt with Dr Lajoyce Corners last week due to being treated for complicated UTI. Outpatient Encounter Medications as of 02/19/2018  Medication Sig  . aspirin EC 81 MG tablet Take 81 mg by mouth daily.  Marland Kitchen atorvastatin (LIPITOR) 40 MG tablet Take 1 tablet (40 mg total) by mouth daily.  . carvedilol (COREG) 3.125 MG tablet Take 1 tablet (3.125 mg total) by mouth 2 (two) times daily with a meal.  . clopidogrel (PLAVIX) 75 MG tablet Take 1 tablet (75 mg total) by mouth daily with breakfast.  . DULoxetine (CYMBALTA) 30 MG capsule Take 30 mg by mouth daily.  . furosemide (LASIX) 20 MG tablet Take 1 tablet (20 mg total) by mouth 3 (three) times a week. On Monday Wednesday friday  . levothyroxine (SYNTHROID, LEVOTHROID) 100 MCG tablet Take 100 mcg by mouth daily before breakfast.   . Multiple Vitamin (MULTIVITAMIN WITH MINERALS) TABS tablet Take 1 tablet by mouth daily.  . sacubitril-valsartan (ENTRESTO) 24-26 MG Take 1 tablet by mouth 3 (three) times a week.  . spironolactone (ALDACTONE) 25 MG tablet Take 1 tablet (25 mg total) by mouth daily.    . tamsulosin (FLOMAX) 0.4 MG CAPS capsule Take 1 capsule (0.4 mg total) by mouth daily after breakfast.  . traMADol (ULTRAM) 50 MG tablet Take by mouth every 4 (four) hours.  . triamcinolone cream (KENALOG) 0.1 % Apply 1 application topically 2 (two) times daily.  Marland Kitchen amoxicillin-clavulanate (AUGMENTIN) 875-125 MG tablet Take 1 tablet by mouth 2 (two) times daily. (Patient not taking: Reported on 02/19/2018)   No facility-administered encounter medications on file as of 02/19/2018.      Patient Active Problem List   Diagnosis Date Noted  . Antibiotic long-term use   . AKI (acute kidney injury) (HCC)   . Severe protein-calorie malnutrition (HCC) 12/12/2017  . Bilateral lower leg cellulitis   . Gangrene of toe of left foot (HCC)   . Pressure injury of skin 12/07/2017  . Sepsis due to cellulitis (HCC) 12/06/2017  . Chronic systolic heart failure (HCC) 10/28/2017  . Dilated cardiomyopathy (HCC) 10/27/2017  . Mitral regurgitation 10/27/2017  . Elevated troponin 10/27/2017  . Elevated brain natriuretic peptide (BNP) level 10/27/2017  . PAD (peripheral artery disease) (HCC) 10/27/2017  . 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 Fatigue 10/22/2017     Health Maintenance Due  Topic Date Due  . Hepatitis C Screening  Jul 18, 1955  . TETANUS/TDAP  05/04/1976  . COLONOSCOPY  05/05/2007  . INFLUENZA VACCINE  02/13/2018  Soc hx: smoking 1ppd  Review of Systems +throbbing pain and increasing drainage to left leg. Denies dysuria, fever, chills, nightsweats. 12 point ros is otherwise negative Physical Exam   BP 109/70   Pulse (!) 109   Temp 98.3 F (36.8 C)   Physical Exam  Constitutional: He is oriented to person, place, and time. He appears well-developed and well-nourished. No distress.  HENT:  Mouth/Throat: Oropharynx is clear and moist. No oropharyngeal exudate.  Cardiovascular: Normal rate, regular rhythm and normal heart sounds. Exam  reveals no gallop and no friction rub.  No murmur heard.  Pulmonary/Chest: Effort normal and breath sounds normal. No respiratory distress. He has no wheezes.  Abdominal: Soft. Bowel sounds are normal. He exhibits no distension. There is no tenderness.  GU = foley in place Ext: erythema up to knee. Serous drainage. Foul odor. Dry gangrene to first and 2nd toe-distally. Has some areas of eschar to posterior lateral malleolus and poserior calf. Some purulent exudate near medial malleolus. Lower extremity looks wet/macerated in general. Neurological: He is alert and oriented to person, place, and time.  Skin: Skin is warm and dry. No rash noted. No erythema.  Psychiatric: He has a normal mood and affect. His behavior is normal.    CBC Lab Results  Component Value Date   WBC 10.5 12/15/2017   RBC 3.19 (L) 12/15/2017   HGB 10.0 (L) 12/15/2017   HCT 30.5 (L) 12/15/2017   PLT 206 12/15/2017   MCV 95.6 12/15/2017   MCH 31.3 12/15/2017   MCHC 32.8 12/15/2017   RDW 15.1 12/15/2017   LYMPHSABS 0.6 (L) 12/06/2017   MONOABS 1.2 (H) 12/06/2017   EOSABS 0.0 12/06/2017    BMET Lab Results  Component Value Date   NA 135 12/15/2017   K 3.9 12/15/2017   CL 96 (L) 12/15/2017   CO2 32 12/15/2017   GLUCOSE 114 (H) 12/15/2017   BUN 18 12/15/2017   CREATININE 0.91 12/15/2017   CALCIUM 8.2 (L) 12/15/2017   GFRNONAA >60 12/15/2017   GFRAA >60 12/15/2017      Assessment and Plan  Lower extremity cellulitis/wound infection with dry gangrene to 2 digits = - have arranged to go to dr duda's office to see his PA for evaluation. Patient in the past may have been resistant to amputation, but now realizes that he is not making progress with healing and would like to pursue BKA - ongoing wounds and healing, needs to likely have more frequent changing of bandages to soak up, less maceration. Continue with silver alginate. Also would like to give him abtx to see if can help minimize cellulitic areas -  will check labs and refill amox/clav  Venous stasis dermatitis = recommend to keep dressing changes and some compression to help with weeping and lower extremity edema  Smoking cessation = counseled to still try to stop smoking since he was successful for 14 days.  RTC in 4 wk or sooner if worsens, will see if he gets bka soon as well.   Spent 45 min with patient iwht greater than 50% in coordination of care for ortho eval

## 2018-02-19 NOTE — Progress Notes (Signed)
Office Visit Note   Patient: Peter ManlyRobert Fennema           Date of Birth: [birth year]           MRN: [id number] Visit Date: 02/19/2018              Requested by: Floria RavelingWilson, Ashley H, FNP [address] [place], [place] [address] PCP: Floria RavelingWilson, Ashley H, FNP  Chief Complaint  Patient presents with  . Left Leg - Open Wound, Edema, Pain      HPI: Patient is a 62 year old gentleman with severe peripheral vascular disease status post revascularization with stent placement for the left lower extremity.  Patient has significantly improved arterial circulation however he still has venous insufficiency both lower extremities and still is actively smoking.  The patient is seen today in urgent referral from Dr. Ilsa IhaSnyder whom he saw earlier this morning.  Has gangrene of the second and great toes on the left.  Open blistered ulcerations midway up his left lower extremity.  These are weeping there is venous stasis swelling.  Is seen for surgical consultation.  Assessment & Plan: Visit Diagnoses:  1. Gangrene of toe of left foot (HCC)   2. 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 PAD (peripheral artery disease) (HCC)   4. Cellulitis of left foot     Plan: Discussed surgical options with the patient again.  Patient is in agreement with the plan to proceed with a below the knee amputation on the left.  Feel that this has best option for wound healing.  We will set this up for next week.  Have encouraged him to continue his antibiotics as prescribed by Dr. Ilsa IhaSnyder.  Will call or report to the ED for any alarm symptoms.  Elnita MaxwellCheryl to call to set up surgery for next Friday.   Follow-Up Instructions: No follow-ups on file.   Ortho Exam  Patient is alert, oriented, no adenopathy, well-dressed, normal affect, normal respiratory effort. 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  He has dry gangrene of the great toe second toe of the left  foot.  He has very small venous ulcers on the right calf but has extensive venous insufficiency ulcers on the left calf.  There is venous brawny skin color changes in both lower extremities due to chronic venous insufficiency.  There is no cellulitis or drainage from either leg.  With patient's new arterial insufficiency he does have bleeding from the granulation tissue.    Imaging: No results found. No images are attached to the encounter.  Labs: Lab Results  Component Value Date   HGBA1C 6.0 (H) 12/07/2017   ESRSEDRATE 46 (H) 01/14/2018   ESRSEDRATE 78 (H) 12/07/2017   CRP 17.2 (H) 01/14/2018   CRP 13.5 (H) 12/07/2017     Lab Results  Component Value Date   ALBUMIN 2.4 (L) 12/07/2017   ALBUMIN 2.8 (L) 12/06/2017   PREALBUMIN 10.8 (L) 12/07/2017    Body mass index is 27.62 kg/m.  Orders:  No orders of the defined types were placed in this encounter.  No orders of the defined types were placed in this encounter.    Procedures: No procedures performed  Clinical Data: No additional findings.  ROS:  All other systems negative, except as noted in the HPI. Review of Systems  Constitutional: Negative for chills and fever.  Cardiovascular: Positive for leg swelling.  Skin: Positive for color change and wound.  Objective: Vital Signs: Ht 5\' 11"  (1.803 m)   Wt 198 lb (89.8 kg)   BMI 27.62 kg/m   Specialty Comments:  No specialty comments available.  PMFS History: Patient Active Problem List   Diagnosis Date Noted  . Antibiotic long-term use   . AKI (acute kidney injury) (HCC)   . Severe protein-calorie malnutrition (HCC) 12/12/2017  . Bilateral lower leg cellulitis   . Gangrene of toe of left foot (HCC)   . Pressure injury of skin 12/07/2017  . Sepsis due to cellulitis (HCC) 12/06/2017  . Chronic systolic heart failure (HCC) 10/28/2017  . Dilated cardiomyopathy (HCC) 10/27/2017  . Mitral regurgitation 10/27/2017  . Elevated troponin 10/27/2017  .  Elevated brain natriuretic peptide (BNP) level 10/27/2017  . PAD (peripheral artery disease) (HCC) 10/27/2017  . 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 Fatigue 10/22/2017   Past Medical History:  Diagnosis Date  . Cellulitis    mild.  L leg  . CHF (congestive heart failure) (HCC)    Dr. Norman Herrlich  . 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  . Hand fracture, right    Dr. Linda Hedges  . Hypothyroidism (acquired)   . Leg ulcer, left (HCC)   . PAD (peripheral artery disease) (HCC)   . Periorbital hematoma, right   . Peripheral neuropathy   . Toe ulcer (HCC)    Left  . Wound of left leg    Non-healing. Dr. Liston Alba, Dr. Bynum Bellows.    Family History  Problem Relation Age of Onset  . Hypertension Mother   . Hyperlipidemia Mother   . Heart attack Father   . Diabetes Father   . Hypertension Father   . Hyperlipidemia Father   . Stroke Maternal Grandmother     Past Surgical History:  Procedure Laterality Date  . AORTOGRAM Left 12/12/2017   Procedure: AORTOGRAM LEFT LOWER EXTREMITY RUNOFF;  Surgeon: Fransisco Hertz, MD;  Location: [facility] OR;  Service: Vascular;  Laterality: Left;  . CYST EXCISION    . PERIPHERAL VASCULAR BALLOON ANGIOPLASTY Left 12/12/2017   Procedure: PERIPHERAL VASCULAR BALLOON ANGIOPLASTY LEFT SFA;  Surgeon: Fransisco Hertz, MD;  [facility];  Service: Vascular;  Laterality: Left;   Social History   Occupational History  . Not on file  Tobacco Use  . Smoking status: Current Every Day Smoker    Packs/day: 2.00    Years: 50.00    Pack years: 100.00    Types: Cigarettes  . Smokeless tobacco: Never Used  Substance and Sexual Activity  . Alcohol use: Not Currently  . Drug use: Not Currently  . Sexual activity: Not on file

## 2018-02-20 LAB — CBC WITH DIFFERENTIAL/PLATELET
Basophils Absolute: 66 cells/uL (ref 0–200)
Basophils Relative: 0.5 %
EOS PCT: 5.9 %
Eosinophils Absolute: 773 cells/uL — ABNORMAL HIGH (ref 15–500)
HEMATOCRIT: 34.3 % — AB (ref 38.5–50.0)
HEMOGLOBIN: 11.6 g/dL — AB (ref 13.2–17.1)
LYMPHS ABS: 694 {cells}/uL — AB (ref 850–3900)
MCH: 32.2 pg (ref 27.0–33.0)
MCHC: 33.8 g/dL (ref 32.0–36.0)
MCV: 95.3 fL (ref 80.0–100.0)
MPV: 8.8 fL (ref 7.5–12.5)
Monocytes Relative: 6.4 %
NEUTROS ABS: 10729 {cells}/uL — AB (ref 1500–7800)
Neutrophils Relative %: 81.9 %
Platelets: 366 10*3/uL (ref 140–400)
RBC: 3.6 10*6/uL — AB (ref 4.20–5.80)
RDW: 13 % (ref 11.0–15.0)
Total Lymphocyte: 5.3 %
WBC mixed population: 838 cells/uL (ref 200–950)
WBC: 13.1 10*3/uL — AB (ref 3.8–10.8)

## 2018-02-20 LAB — BASIC METABOLIC PANEL
BUN/Creatinine Ratio: 33 (calc) — ABNORMAL HIGH (ref 6–22)
BUN: 26 mg/dL — AB (ref 7–25)
CALCIUM: 8.9 mg/dL (ref 8.6–10.3)
CO2: 27 mmol/L (ref 20–32)
CREATININE: 0.79 mg/dL (ref 0.70–1.25)
Chloride: 98 mmol/L (ref 98–110)
GLUCOSE: 99 mg/dL (ref 65–99)
Potassium: 5.2 mmol/L (ref 3.5–5.3)
SODIUM: 133 mmol/L — AB (ref 135–146)

## 2018-02-20 LAB — C-REACTIVE PROTEIN: CRP: 79.4 mg/L — ABNORMAL HIGH (ref <8.0)

## 2018-02-20 LAB — SEDIMENTATION RATE: Sed Rate: 75 mm/h — ABNORMAL HIGH (ref 0–20)

## 2018-02-21 ENCOUNTER — Other Ambulatory Visit (INDEPENDENT_AMBULATORY_CARE_PROVIDER_SITE_OTHER): Payer: Self-pay | Admitting: Orthopedic Surgery

## 2018-02-21 DIAGNOSIS — I96 Gangrene, not elsewhere classified: Secondary | ICD-10-CM

## 2018-02-25 ENCOUNTER — Telehealth (INDEPENDENT_AMBULATORY_CARE_PROVIDER_SITE_OTHER): Payer: Self-pay | Admitting: Orthopedic Surgery

## 2018-02-25 NOTE — Telephone Encounter (Signed)
Patients wife called and needs to know when patient should stop taking plavix and baby aspirin. His surgery is Friday. Please advise Marylee FlorasBlanche # [phone]

## 2018-02-25 NOTE — Telephone Encounter (Signed)
Dr. Lajoyce Cornersuda does not require pt's to stop blood thinner prior to surgery. Hospital will call with instructions. To call with questions.

## 2018-02-27 ENCOUNTER — Encounter (HOSPITAL_COMMUNITY): Payer: Self-pay | Admitting: *Deleted

## 2018-02-27 ENCOUNTER — Other Ambulatory Visit: Payer: Self-pay

## 2018-02-27 NOTE — Progress Notes (Addendum)
Anesthesia Chart Review: SAME DAY WORKUP  Case:  [id number] Date/Time:  02/28/18 1000   Procedure:  LEFT BELOW KNEE AMPUTATION (Left )   Anesthesia type:  Choice   Pre-op diagnosis:  Gangrene Left Foot   [facility]  [facility] / [facility]   Surgeon:  Nadara Mustarduda, Marcus V, MD      DISCUSSION: 62 yo male current smoker for above procedure. Pertinent hx includes CHF (EF 20-25% by Echo 10/2017, EF 17% by Eugenie BirksLexiscan 11/22/2017), PAD, Peripheral neuropathy, Hypothyroid. S/p subintimal angioplasty and stenting left SFA 12/12/2017.  Pt was seen by cardiology Dr. Norman HerrlichBrian Munley 12/25/2017 for f/u of CHF. At that time it was discussed that his myocardial perfusion study showed no ischemia but due to EF of 17% they may need to discuss ICD therapy once has has recovered from PVD. He also noted that the patient was "Stable clinically not severe". Advised pt to followup in 3-4 weeks.  In the interim the pt developed worsening gangrene of the second and great toes on the left as well as open blistered ulcerations midway up his left lower extremity. He was started on antibiotics by Infectious Disease Dr. Judyann Munsonynthia Snider and sent for urgent referral to ortho for surgical eval.  Per note from Forrest, Autumn, RMA dated 02/25/2018 "Dr. Lajoyce Cornersuda does not require pt's to stop blood thinner prior to surgery".  Severely depressed LV function, but appears clinically stable and currently optimized per Dr. Hulen ShoutsMunley's note. Anticipate can proceed with procedure as planned barring acute status change.  VS: There were no vitals taken for this visit.  PROVIDERS: Floria RavelingWilson, Ashley H, FNP is PCP  Norman HerrlichMunley, Brian, MD is Cardiologist last seen 12/25/2017  Leonides Sakehen, Brian, MD is Vascular surgeon last seen 01/08/2018  LABS: Labs reviewed: Acceptable for surgery.    Ref. Range 02/19/2018 10:33  BASIC METABOLIC PANEL Unknown Rpt (A)  Sodium Latest Ref Range: 135 - 146 mmol/L 133 (L)  Potassium Latest Ref Range: 3.5 - 5.3 mmol/L 5.2  Chloride Latest Ref Range:  98 - 110 mmol/L 98  CO2 Latest Ref Range: 20 - 32 mmol/L 27  Glucose Latest Ref Range: 65 - 99 mg/dL 99  BUN Latest Ref Range: 7 - 25 mg/dL 26 (H)  Creatinine Latest Ref Range: 0.70 - 1.25 mg/dL 7.820.79  Calcium Latest Ref Range: 8.6 - 10.3 mg/dL 8.9  BUN/Creatinine Ratio Latest Ref Range: 6 - 22 (calc) 33 (H)  CRP Latest Ref Range: <8.0 mg/L 79.4 (H)  WBC Latest Ref Range: 3.8 - 10.8 Thousand/uL 13.1 (H)  WBC mixed population Latest Ref Range: 200 - 950 cells/uL 838  RBC Latest Ref Range: 4.20 - 5.80 Million/uL 3.60 (L)  Hemoglobin Latest Ref Range: 13.2 - 17.1 g/dL 95.611.6 (L)  HCT Latest Ref Range: 38.5 - 50.0 % 34.3 (L)  MCV Latest Ref Range: 80.0 - 100.0 fL 95.3  MCH Latest Ref Range: 27.0 - 33.0 pg 32.2  MCHC Latest Ref Range: 32.0 - 36.0 g/dL 21.333.8  RDW Latest Ref Range: 11.0 - 15.0 % 13.0  Platelets Latest Ref Range: 140 - 400 Thousand/uL 366  MPV Latest Ref Range: 7.5 - 12.5 fL 8.8  Neutrophils Latest Units: % 81.9  Monocytes Relative Latest Units: % 6.4  Eosinophil Latest Units: % 5.9  Basophil Latest Units: % 0.5  NEUT# Latest Ref Range: 1,500 - 7,800 cells/uL 10,729 (H)  Lymphocyte # Latest Ref Range: 850 - 3,900 cells/uL 694 (L)  Total Lymphocyte Latest Units: % 5.3  Eosinophils Absolute Latest Ref  Range: 15 - 500 cells/uL 773 (H)  Basophils Absolute Latest Ref Range: 0 - 200 cells/uL 66  Sed Rate Latest Ref Range: 0 - 20 mm/h 75 (H)    IMAGES: N/A  EKG: 10/19/2017: Sinus rhythm, possible LAW, low voltage QRS, nonspecific ST changes.  CV: Lexiscan 11/22/2017 Findings: Perfusion: Fixed large area of markedly decreased activity involving the anteroapical region, apex of the lateral wall of the left ventricular myocardium.  Fixed large area of moderately decreased activity involving the inferior wall and septum.  No definite reversible ischemia. Wall motion: Mild left ventricular dilation.  Global hypokinesis. Left ventricular ejection fraction: 70%  ADDENDUM 02/28/2018:  dictation captured EF of 70% rather than true EF which is listed as 17% in report as indicated below. End-diastolic volume 173 mL End-systolic volume 143 mL Impression:  1.  Large areas of apical, lateral, probably inferior and septal infarct/scar.  No reversible ischemia identified. 2.  Global hypokinesis with left ventricular dilation. 3.  Left ventricular ejection fraction 17% 4.  Noninvasive risk stratification: hIGH  Past Medical History:  Diagnosis Date  . Cellulitis    mild.  L leg  . CHF (congestive heart failure) (HCC)    Dr. Norman HerrlichBrian Munley  . 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  . Hand fracture, right    Dr. Linda HedgesJeffrey Yaste  . Hypothyroidism (acquired)   . Leg ulcer, left (HCC)   . PAD (peripheral artery disease) (HCC)   . Periorbital hematoma, right   . Peripheral neuropathy   . Toe ulcer (HCC)    Left  . Wound of left leg    Non-healing. Dr. Liston AlbaPedro Hernandez, Dr. Bynum Bellowsitorya Strover.    Past Surgical History:  Procedure Laterality Date  . AORTOGRAM Left 12/12/2017   Procedure: AORTOGRAM LEFT LOWER EXTREMITY RUNOFF;  Surgeon: Fransisco Hertzhen, Brian L, MD;  [facility];  Service: Vascular;  Laterality: Left;  . CYST EXCISION    . PERIPHERAL VASCULAR BALLOON ANGIOPLASTY Left 12/12/2017   Procedure: PERIPHERAL VASCULAR BALLOON ANGIOPLASTY LEFT SFA;  Surgeon: Fransisco Hertzhen, Brian L, MD;  [facility];  Service: Vascular;  Laterality: Left;    MEDICATIONS: No current facility-administered medications for this encounter.    Marland Kitchen. acetaminophen (TYLENOL) 500 MG tablet  . amoxicillin-clavulanate (AUGMENTIN) 875-125 MG tablet  . aspirin EC 81 MG tablet  . atorvastatin (LIPITOR) 40 MG tablet  . carvedilol (COREG) 3.125 MG tablet  . clopidogrel (PLAVIX) 75 MG tablet  . DULoxetine (CYMBALTA) 30 MG capsule  . furosemide (LASIX) 20 MG tablet  . levothyroxine (SYNTHROID, LEVOTHROID) 100 MCG tablet  . Multiple Vitamin (MULTIVITAMIN WITH MINERALS) TABS tablet  . sacubitril-valsartan (ENTRESTO)  24-26 MG  . spironolactone (ALDACTONE) 25 MG tablet  . tamsulosin (FLOMAX) 0.4 MG CAPS capsule  . traMADol (ULTRAM) 50 MG tablet  . triamcinolone cream (KENALOG) 0.1 %     Zannie CoveJames Burns, PA-C [facility]Anesthesiology Phone [phone] 02/27/2018 10:46 AM

## 2018-02-28 ENCOUNTER — Inpatient Hospital Stay (HOSPITAL_COMMUNITY): Payer: PRIVATE HEALTH INSURANCE | Admitting: Physician Assistant

## 2018-02-28 ENCOUNTER — Inpatient Hospital Stay (HOSPITAL_COMMUNITY)
Admission: RE | Admit: 2018-02-28 | Discharge: 2018-03-04 | DRG: 240 | Disposition: A | Discharge status: Skilled Nursing Facility | Payer: PRIVATE HEALTH INSURANCE | Attending: Orthopedic Surgery | Admitting: Orthopedic Surgery

## 2018-02-28 ENCOUNTER — Other Ambulatory Visit: Payer: Self-pay

## 2018-02-28 ENCOUNTER — Encounter (HOSPITAL_COMMUNITY)
Admission: RE | Disposition: A | Discharge status: Skilled Nursing Facility | Payer: Self-pay | Source: Home / Self Care | Attending: Orthopedic Surgery

## 2018-02-28 ENCOUNTER — Encounter (HOSPITAL_COMMUNITY): Payer: Self-pay | Admitting: Certified Registered"

## 2018-02-28 DIAGNOSIS — I5022 Chronic systolic (congestive) heart failure: Secondary | ICD-10-CM | POA: Diagnosis present

## 2018-02-28 DIAGNOSIS — I96 Gangrene, not elsewhere classified: Secondary | ICD-10-CM | POA: Diagnosis not present

## 2018-02-28 DIAGNOSIS — Z7902 Long term (current) use of antithrombotics/antiplatelets: Secondary | ICD-10-CM | POA: Diagnosis not present

## 2018-02-28 DIAGNOSIS — N400 Enlarged prostate without lower urinary tract symptoms: Secondary | ICD-10-CM | POA: Diagnosis present

## 2018-02-28 DIAGNOSIS — Z833 Family history of diabetes mellitus: Secondary | ICD-10-CM | POA: Diagnosis not present

## 2018-02-28 DIAGNOSIS — Z79891 Long term (current) use of opiate analgesic: Secondary | ICD-10-CM | POA: Diagnosis not present

## 2018-02-28 DIAGNOSIS — Z823 Family history of stroke: Secondary | ICD-10-CM | POA: Diagnosis not present

## 2018-02-28 DIAGNOSIS — Z882 Allergy status to sulfonamides status: Secondary | ICD-10-CM | POA: Diagnosis not present

## 2018-02-28 DIAGNOSIS — L97529 Non-pressure chronic ulcer of other part of left foot with unspecified severity: Secondary | ICD-10-CM | POA: Diagnosis present

## 2018-02-28 DIAGNOSIS — Z8349 Family history of other endocrine, nutritional and metabolic diseases: Secondary | ICD-10-CM | POA: Diagnosis not present

## 2018-02-28 DIAGNOSIS — Z7982 Long term (current) use of aspirin: Secondary | ICD-10-CM

## 2018-02-28 DIAGNOSIS — I739 Peripheral vascular disease, unspecified: Principal | ICD-10-CM | POA: Diagnosis present

## 2018-02-28 DIAGNOSIS — Z8249 Family history of ischemic heart disease and other diseases of the circulatory system: Secondary | ICD-10-CM | POA: Diagnosis not present

## 2018-02-28 DIAGNOSIS — I878 Other specified disorders of veins: Secondary | ICD-10-CM | POA: Diagnosis present

## 2018-02-28 DIAGNOSIS — L97229 Non-pressure chronic ulcer of left calf with unspecified severity: Secondary | ICD-10-CM | POA: Diagnosis present

## 2018-02-28 DIAGNOSIS — Z89512 Acquired absence of left leg below knee: Secondary | ICD-10-CM

## 2018-02-28 DIAGNOSIS — E039 Hypothyroidism, unspecified: Secondary | ICD-10-CM | POA: Diagnosis present

## 2018-02-28 DIAGNOSIS — Z881 Allergy status to other antibiotic agents status: Secondary | ICD-10-CM | POA: Diagnosis not present

## 2018-02-28 DIAGNOSIS — F17210 Nicotine dependence, cigarettes, uncomplicated: Secondary | ICD-10-CM | POA: Diagnosis present

## 2018-02-28 DIAGNOSIS — Z79890 Hormone replacement therapy (postmenopausal): Secondary | ICD-10-CM

## 2018-02-28 DIAGNOSIS — G629 Polyneuropathy, unspecified: Secondary | ICD-10-CM | POA: Diagnosis present

## 2018-02-28 DIAGNOSIS — D649 Anemia, unspecified: Secondary | ICD-10-CM | POA: Diagnosis present

## 2018-02-28 DIAGNOSIS — Z79899 Other long term (current) drug therapy: Secondary | ICD-10-CM | POA: Diagnosis not present

## 2018-02-28 HISTORY — DX: Acquired absence of left leg below knee: Z89.512

## 2018-02-28 HISTORY — PX: BELOW KNEE LEG AMPUTATION: SUR23

## 2018-02-28 HISTORY — DX: Benign prostatic hyperplasia without lower urinary tract symptoms: N40.0

## 2018-02-28 HISTORY — DX: Other complications of anesthesia, initial encounter: T88.59XA

## 2018-02-28 HISTORY — PX: AMPUTATION: SHX166

## 2018-02-28 HISTORY — DX: Adverse effect of unspecified anesthetic, initial encounter: T41.45XA

## 2018-02-28 HISTORY — DX: Other difficulties with micturition: R39.198

## 2018-02-28 SURGERY — AMPUTATION BELOW KNEE
Anesthesia: Monitor Anesthesia Care | Laterality: Left

## 2018-02-28 MED ORDER — ASPIRIN EC 81 MG PO TBEC
81.0000 mg | DELAYED_RELEASE_TABLET | Freq: Every day | ORAL | Status: DC
  Administered 2018-02-28 – 2018-03-04 (×5): 81 mg via ORAL
  Filled 2018-02-28 (×5): qty 1

## 2018-02-28 MED ORDER — METHOCARBAMOL 500 MG PO TABS
500.0000 mg | ORAL_TABLET | Freq: Four times a day (QID) | ORAL | Status: DC | PRN
  Administered 2018-02-28 – 2018-03-01 (×3): 500 mg via ORAL
  Filled 2018-02-28 (×3): qty 1

## 2018-02-28 MED ORDER — CEFAZOLIN SODIUM-DEXTROSE 2-4 GM/100ML-% IV SOLN
2.0000 g | INTRAVENOUS | Status: AC
  Administered 2018-02-28: 2 g via INTRAVENOUS

## 2018-02-28 MED ORDER — POLYETHYLENE GLYCOL 3350 17 G PO PACK: 17.0000 g | PACK | Freq: Every day | ORAL | Status: DC | PRN

## 2018-02-28 MED ORDER — LEVOTHYROXINE SODIUM 25 MCG PO TABS
125.0000 ug | ORAL_TABLET | Freq: Every day | ORAL | Status: DC
  Administered 2018-03-01 – 2018-03-04 (×4): 125 ug via ORAL
  Filled 2018-02-28 (×4): qty 1

## 2018-02-28 MED ORDER — CEFAZOLIN SODIUM-DEXTROSE 2-4 GM/100ML-% IV SOLN
INTRAVENOUS | Status: AC
  Filled 2018-02-28: qty 100

## 2018-02-28 MED ORDER — ATORVASTATIN CALCIUM 40 MG PO TABS
40.0000 mg | ORAL_TABLET | Freq: Every day | ORAL | Status: DC
  Administered 2018-02-28 – 2018-03-04 (×5): 40 mg via ORAL
  Filled 2018-02-28 (×5): qty 1

## 2018-02-28 MED ORDER — PROMETHAZINE HCL 25 MG/ML IJ SOLN: 6.2500 mg | INTRAMUSCULAR | Status: DC | PRN

## 2018-02-28 MED ORDER — ONDANSETRON HCL 4 MG PO TABS: 4.0000 mg | ORAL_TABLET | Freq: Four times a day (QID) | ORAL | Status: DC | PRN

## 2018-02-28 MED ORDER — TAMSULOSIN HCL 0.4 MG PO CAPS
0.4000 mg | ORAL_CAPSULE | Freq: Every day | ORAL | Status: DC
  Administered 2018-03-01 – 2018-03-04 (×4): 0.4 mg via ORAL
  Filled 2018-02-28 (×4): qty 1

## 2018-02-28 MED ORDER — ONDANSETRON HCL 4 MG/2ML IJ SOLN: 4.0000 mg | Freq: Four times a day (QID) | INTRAMUSCULAR | Status: DC | PRN

## 2018-02-28 MED ORDER — MIDAZOLAM HCL 2 MG/2ML IJ SOLN
INTRAMUSCULAR | Status: AC
  Administered 2018-02-28: 2 mg
  Filled 2018-02-28: qty 2

## 2018-02-28 MED ORDER — SACUBITRIL-VALSARTAN 24-26 MG PO TABS
1.0000 | ORAL_TABLET | ORAL | Status: DC
  Administered 2018-03-03: 1 via ORAL
  Filled 2018-02-28 (×2): qty 1

## 2018-02-28 MED ORDER — HYDROMORPHONE HCL 1 MG/ML IJ SOLN: 0.2500 mg | INTRAMUSCULAR | Status: DC | PRN

## 2018-02-28 MED ORDER — FENTANYL CITRATE (PF) 100 MCG/2ML IJ SOLN
INTRAMUSCULAR | Status: AC
  Administered 2018-02-28: 50 ug
  Filled 2018-02-28: qty 2

## 2018-02-28 MED ORDER — ONDANSETRON HCL 4 MG/2ML IJ SOLN
INTRAMUSCULAR | Status: AC
  Filled 2018-02-28: qty 2

## 2018-02-28 MED ORDER — CEFAZOLIN SODIUM-DEXTROSE 1-4 GM/50ML-% IV SOLN
1.0000 g | Freq: Four times a day (QID) | INTRAVENOUS | Status: AC
  Administered 2018-02-28 – 2018-03-01 (×3): 1 g via INTRAVENOUS
  Filled 2018-02-28 (×3): qty 50

## 2018-02-28 MED ORDER — OXYCODONE HCL 5 MG PO TABS
5.0000 mg | ORAL_TABLET | ORAL | Status: DC | PRN
  Administered 2018-03-01: 10 mg via ORAL
  Filled 2018-02-28: qty 2

## 2018-02-28 MED ORDER — OXYCODONE HCL 5 MG PO TABS: 5.0000 mg | ORAL_TABLET | Freq: Once | ORAL | Status: DC | PRN

## 2018-02-28 MED ORDER — HYDROMORPHONE HCL 1 MG/ML IJ SOLN
0.5000 mg | INTRAMUSCULAR | Status: DC | PRN
  Administered 2018-03-01 (×2): 1 mg via INTRAVENOUS
  Filled 2018-02-28 (×2): qty 1

## 2018-02-28 MED ORDER — FENTANYL CITRATE (PF) 100 MCG/2ML IJ SOLN: 50.0000 ug | Freq: Once | INTRAMUSCULAR | Status: DC

## 2018-02-28 MED ORDER — ONDANSETRON HCL 4 MG/2ML IJ SOLN
INTRAMUSCULAR | Status: DC | PRN
  Administered 2018-02-28: 4 mg via INTRAVENOUS

## 2018-02-28 MED ORDER — SPIRONOLACTONE 25 MG PO TABS
25.0000 mg | ORAL_TABLET | Freq: Every day | ORAL | Status: DC
  Administered 2018-02-28 – 2018-03-04 (×5): 25 mg via ORAL
  Filled 2018-02-28 (×5): qty 1

## 2018-02-28 MED ORDER — GABAPENTIN 300 MG PO CAPS
300.0000 mg | ORAL_CAPSULE | Freq: Three times a day (TID) | ORAL | Status: DC
  Administered 2018-02-28 – 2018-03-04 (×12): 300 mg via ORAL
  Filled 2018-02-28 (×13): qty 1

## 2018-02-28 MED ORDER — METOCLOPRAMIDE HCL 5 MG/ML IJ SOLN: 5.0000 mg | Freq: Three times a day (TID) | INTRAMUSCULAR | Status: DC | PRN

## 2018-02-28 MED ORDER — CHLORHEXIDINE GLUCONATE 4 % EX LIQD: 60.0000 mL | Freq: Once | CUTANEOUS | Status: DC

## 2018-02-28 MED ORDER — METOCLOPRAMIDE HCL 5 MG PO TABS: 5.0000 mg | ORAL_TABLET | Freq: Three times a day (TID) | ORAL | Status: DC | PRN

## 2018-02-28 MED ORDER — MIDAZOLAM HCL 2 MG/2ML IJ SOLN: 2.0000 mg | Freq: Once | INTRAMUSCULAR | Status: DC

## 2018-02-28 MED ORDER — OXYCODONE HCL 5 MG/5ML PO SOLN: 5.0000 mg | Freq: Once | ORAL | Status: DC | PRN

## 2018-02-28 MED ORDER — SODIUM CHLORIDE 0.9 % IV SOLN: INTRAVENOUS | Status: DC

## 2018-02-28 MED ORDER — DOCUSATE SODIUM 100 MG PO CAPS
100.0000 mg | ORAL_CAPSULE | Freq: Two times a day (BID) | ORAL | Status: DC
  Administered 2018-02-28 – 2018-03-04 (×8): 100 mg via ORAL
  Filled 2018-02-28 (×9): qty 1

## 2018-02-28 MED ORDER — LACTATED RINGERS IV SOLN
INTRAVENOUS | Status: DC
  Administered 2018-02-28: 08:00:00 via INTRAVENOUS

## 2018-02-28 MED ORDER — FENTANYL CITRATE (PF) 250 MCG/5ML IJ SOLN
INTRAMUSCULAR | Status: AC
  Filled 2018-02-28: qty 5

## 2018-02-28 MED ORDER — 0.9 % SODIUM CHLORIDE (POUR BTL) OPTIME
TOPICAL | Status: DC | PRN
  Administered 2018-02-28: 1000 mL

## 2018-02-28 MED ORDER — BISACODYL 10 MG RE SUPP: 10.0000 mg | Freq: Every day | RECTAL | Status: DC | PRN

## 2018-02-28 MED ORDER — PROPOFOL 500 MG/50ML IV EMUL
INTRAVENOUS | Status: DC | PRN
  Administered 2018-02-28: 75 ug/kg/min via INTRAVENOUS

## 2018-02-28 MED ORDER — ROPIVACAINE HCL 5 MG/ML IJ SOLN
INTRAMUSCULAR | Status: DC | PRN
  Administered 2018-02-28: 50 mL via PERINEURAL

## 2018-02-28 MED ORDER — MAGNESIUM CITRATE PO SOLN: 1.0000 | Freq: Once | ORAL | Status: DC | PRN

## 2018-02-28 MED ORDER — METHOCARBAMOL 1000 MG/10ML IJ SOLN
500.0000 mg | Freq: Four times a day (QID) | INTRAVENOUS | Status: DC | PRN
  Filled 2018-02-28: qty 5

## 2018-02-28 MED ORDER — PROPOFOL 10 MG/ML IV BOLUS
INTRAVENOUS | Status: DC | PRN
  Administered 2018-02-28: 10 mg via INTRAVENOUS
  Administered 2018-02-28 (×2): 20 mg via INTRAVENOUS

## 2018-02-28 MED ORDER — OXYCODONE HCL 5 MG PO TABS
10.0000 mg | ORAL_TABLET | ORAL | Status: DC | PRN
  Administered 2018-03-01 – 2018-03-03 (×4): 15 mg via ORAL
  Filled 2018-02-28 (×4): qty 3

## 2018-02-28 MED ORDER — ACETAMINOPHEN 325 MG PO TABS
325.0000 mg | ORAL_TABLET | Freq: Four times a day (QID) | ORAL | Status: DC | PRN
  Administered 2018-03-01 – 2018-03-02 (×3): 650 mg via ORAL
  Filled 2018-02-28 (×3): qty 2

## 2018-02-28 MED ORDER — CLOPIDOGREL BISULFATE 75 MG PO TABS
75.0000 mg | ORAL_TABLET | Freq: Every day | ORAL | Status: DC
  Administered 2018-03-01 – 2018-03-04 (×4): 75 mg via ORAL
  Filled 2018-02-28 (×4): qty 1

## 2018-02-28 MED ORDER — DULOXETINE HCL 30 MG PO CPEP
30.0000 mg | ORAL_CAPSULE | Freq: Every day | ORAL | Status: DC
  Administered 2018-02-28 – 2018-03-03 (×4): 30 mg via ORAL
  Filled 2018-02-28 (×5): qty 1

## 2018-02-28 MED ORDER — CARVEDILOL 3.125 MG PO TABS
3.1250 mg | ORAL_TABLET | Freq: Two times a day (BID) | ORAL | Status: DC
  Administered 2018-02-28 – 2018-03-04 (×8): 3.125 mg via ORAL
  Filled 2018-02-28 (×9): qty 1

## 2018-02-28 MED ORDER — PROPOFOL 1000 MG/100ML IV EMUL
INTRAVENOUS | Status: AC
  Filled 2018-02-28: qty 100

## 2018-02-28 SURGICAL SUPPLY — 36 items
BENZOIN TINCTURE PRP APPL 2/3 (GAUZE/BANDAGES/DRESSINGS) ×10 IMPLANT
BLADE SAW RECIP 87.9 MT (BLADE) ×4 IMPLANT
BLADE SURG 21 STRL SS (BLADE) ×2 IMPLANT
BNDG COHESIVE 6X5 TAN STRL LF (GAUZE/BANDAGES/DRESSINGS) ×4 IMPLANT
BNDG GAUZE ELAST 4 BULKY (GAUZE/BANDAGES/DRESSINGS) ×4 IMPLANT
COVER SURGICAL LIGHT HANDLE (MISCELLANEOUS) ×2 IMPLANT
CUFF TOURNIQUET SINGLE 34IN LL (TOURNIQUET CUFF) IMPLANT
CUFF TOURNIQUET SINGLE 44IN (TOURNIQUET CUFF) ×2 IMPLANT
DRAPE INCISE IOBAN 66X45 STRL (DRAPES) ×2 IMPLANT
DRAPE U-SHAPE 47X51 STRL (DRAPES) ×2 IMPLANT
DRESSING PREVENA PLUS CUSTOM (GAUZE/BANDAGES/DRESSINGS) ×1 IMPLANT
DRSG PREVENA PLUS CUSTOM (GAUZE/BANDAGES/DRESSINGS) ×2
DURAPREP 26ML APPLICATOR (WOUND CARE) ×2 IMPLANT
ELECT REM PT RETURN 9FT ADLT (ELECTROSURGICAL) ×2
ELECTRODE REM PT RTRN 9FT ADLT (ELECTROSURGICAL) ×1 IMPLANT
GLOVE BIOGEL PI IND STRL 9 (GLOVE) ×1 IMPLANT
GLOVE BIOGEL PI INDICATOR 9 (GLOVE) ×1
GLOVE SURG ORTHO 9.0 STRL STRW (GLOVE) ×2 IMPLANT
GOWN STRL REUS W/ TWL XL LVL3 (GOWN DISPOSABLE) ×2 IMPLANT
GOWN STRL REUS W/TWL XL LVL3 (GOWN DISPOSABLE) ×2
KIT BASIN OR (CUSTOM PROCEDURE TRAY) ×2 IMPLANT
KIT TURNOVER KIT B (KITS) ×2 IMPLANT
MANIFOLD NEPTUNE II (INSTRUMENTS) ×2 IMPLANT
NS IRRIG 1000ML POUR BTL (IV SOLUTION) ×2 IMPLANT
PACK ORTHO EXTREMITY (CUSTOM PROCEDURE TRAY) ×2 IMPLANT
PAD ARMBOARD 7.5X6 YLW CONV (MISCELLANEOUS) ×2 IMPLANT
PREVENA RESTOR ARTHOFORM 46X30 (CANNISTER) ×2 IMPLANT
SPONGE LAP 18X18 X RAY DECT (DISPOSABLE) IMPLANT
STAPLER VISISTAT 35W (STAPLE) ×2 IMPLANT
STOCKINETTE IMPERVIOUS LG (DRAPES) ×2 IMPLANT
SUT SILK 2 0 (SUTURE) ×1
SUT SILK 2-0 18XBRD TIE 12 (SUTURE) ×1 IMPLANT
SUT VIC AB 1 CTX 27 (SUTURE) IMPLANT
TOWEL OR 17X26 10 PK STRL BLUE (TOWEL DISPOSABLE) ×2 IMPLANT
TUBE CONNECTING 12X1/4 (SUCTIONS) ×2 IMPLANT
YANKAUER SUCT BULB TIP NO VENT (SUCTIONS) ×2 IMPLANT

## 2018-02-28 NOTE — Care Management Note (Signed)
Case Management Note  Patient Details  Name: Alger SimonsRobert T Mausolf MRN: [id number] Date of Birth: 1956/01/21  Subjective/Objective:                    Action/Plan:  8/16  Transtibial amputation Application of Prevena wound VAC  Await PT eval  Expected Discharge Date:                  Expected Discharge Plan:     In-House Referral:     Discharge planning Services  CM Consult  Post Acute Care Choice:  Durable Medical Equipment, Home Health Choice offered to:     DME Arranged:    DME Agency:     HH Arranged:    HH Agency:     Status of Service:  In process, will continue to follow  If discussed at Long Length of Stay Meetings, dates discussed:    Additional Comments:  Kingsley PlanWile, Tereasa Yilmaz Marie, RN 02/28/2018, 2:57 PM

## 2018-02-28 NOTE — Plan of Care (Signed)

## 2018-02-28 NOTE — Progress Notes (Signed)
Orthopedic Tech Progress Note Patient Details:  Peter SimonsRobert T Hooser 03/27/1956 [id number]  Patient ID: Peter Simonsobert T Batch, male   DOB: 03/27/1956, 62 y.o.   MRN: [id number]   Saul FordyceJennifer C Mercedees Convery 02/28/2018, 2:44 PMCalled [facility] for left stump shrinker and limb guard.

## 2018-02-28 NOTE — Progress Notes (Signed)
Patient's bp 96/47 s/p block for surgery.  Coreg not given this am but patient took last evening.

## 2018-02-28 NOTE — H&P (Signed)
Peter SimonsRobert T Cochran is an 62 y.o. male.   Chief Complaint: Painful gangrene left lower extremity with open draining venous ulcers. HPI: Patient is a 62 year old gentleman with severe peripheral vascular disease status post revascularization to the left lower extremity in May.  Despite revascularization patient still has gangrenous forefoot with progressive ulceration in the left calf.  Past Medical History:  Diagnosis Date  . Cellulitis    mild.  L leg  . CHF (congestive heart failure) (HCC)    Dr. Norman HerrlichBrian Munley  . Complication of anesthesia    'hard time waking up"  . Difficulty voiding   . 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  . Enlarged prostate   . Hand fracture, right    Dr. Linda HedgesJeffrey Yaste  . Hypothyroidism (acquired)   . Leg ulcer, left (HCC)   . PAD (peripheral artery disease) (HCC)   . Periorbital hematoma, right   . Peripheral neuropathy   . Toe ulcer (HCC)    Left  . Wound of left leg    Non-healing. Dr. Liston AlbaPedro Hernandez, Dr. Bynum Bellowsitorya Strover.    Past Surgical History:  Procedure Laterality Date  . AORTOGRAM Left 12/12/2017   Procedure: AORTOGRAM LEFT LOWER EXTREMITY RUNOFF;  Surgeon: Fransisco Hertzhen, Brian L, MD;  [facility];  Service: Vascular;  Laterality: Left;  . CYST EXCISION    . PERIPHERAL VASCULAR BALLOON ANGIOPLASTY Left 12/12/2017   Procedure: PERIPHERAL VASCULAR BALLOON ANGIOPLASTY LEFT SFA;  Surgeon: Fransisco Hertzhen, Brian L, MD;  [facility];  Service: Vascular;  Laterality: Left;  . PILONIDAL CYST DRAINAGE      Family History  Problem Relation Age of Onset  . Hypertension Mother   . Hyperlipidemia Mother   . Heart attack Father   . Diabetes Father   . Hypertension Father   . Hyperlipidemia Father   . Stroke Maternal Grandmother    Social History:  reports that he has been smoking cigarettes. He has a 25.00 pack-year smoking history. He has never used smokeless tobacco. He reports that he drank alcohol. He reports that he has current or past drug  history.  Allergies:  Allergies  Allergen Reactions  . Oxytetracycline Itching, Swelling and Other (See Comments)    Facial swelling  . Sulfa Antibiotics Rash    Medications Prior to Admission  Medication Sig Dispense Refill  . acetaminophen (TYLENOL) 500 MG tablet Take 1,000 mg by mouth every 6 (six) hours as needed for moderate pain or headache.    Marland Kitchen. amoxicillin-clavulanate (AUGMENTIN) 875-125 MG tablet Take 1 tablet by mouth 2 (two) times daily. 60 tablet 0  . aspirin EC 81 MG tablet Take 81 mg by mouth daily.    Marland Kitchen. atorvastatin (LIPITOR) 40 MG tablet Take 1 tablet (40 mg total) by mouth daily. 30 tablet 11  . carvedilol (COREG) 3.125 MG tablet Take 1 tablet (3.125 mg total) by mouth 2 (two) times daily with a meal. 60 tablet 11  . clopidogrel (PLAVIX) 75 MG tablet Take 1 tablet (75 mg total) by mouth daily with breakfast. 30 tablet 0  . diphenhydrAMINE (BENADRYL) 25 mg capsule Take 25 mg by mouth every 6 (six) hours as needed.    . DULoxetine (CYMBALTA) 30 MG capsule Take 30 mg by mouth at bedtime.     . furosemide (LASIX) 20 MG tablet Take 1 tablet (20 mg total) by mouth 3 (three) times a week. On Monday Wednesday friday (Patient taking differently: Take 20 mg by mouth every Monday, Wednesday, and Friday. )  90 tablet 3  . levothyroxine (SYNTHROID, LEVOTHROID) 100 MCG tablet Take 125 mcg by mouth daily before breakfast.     . Multiple Vitamin (MULTIVITAMIN WITH MINERALS) TABS tablet Take 1 tablet by mouth daily. 30 tablet 0  . sacubitril-valsartan (ENTRESTO) 24-26 MG Take 1 tablet by mouth 3 (three) times a week. 15 tablet 3  . spironolactone (ALDACTONE) 25 MG tablet Take 1 tablet (25 mg total) by mouth daily. 90 tablet 3  . tamsulosin (FLOMAX) 0.4 MG CAPS capsule Take 1 capsule (0.4 mg total) by mouth daily after breakfast. 30 capsule 0  . traMADol (ULTRAM) 50 MG tablet Take 50 mg by mouth every 4 (four) hours as needed for moderate pain.     Marland Kitchen. triamcinolone cream (KENALOG) 0.1 % Apply  1 application topically 2 (two) times daily as needed (for rash).       No results found for this or any previous visit (from the past 48 hour(s)). No results found.  Review of Systems  All other systems reviewed and are negative.   There were no vitals taken for this visit. Physical Exam  Examination patient is alert oriented no adenopathy well-dressed normal affect normal respiratory effort patient has an antalgic gait.  Patient has gangrenous changes to the forefoot venous stasis draining ulcers to the left leg. Assessment/Plan Assessment: Peripheral vascular disease status post revascularization left lower extremity with gangrene of the left forefoot and progressive ulceration to the left leg.  Plan: Patient wished to proceed with a transtibial amputation.  Risks and benefits were discussed including infection neurovascular injury nonhealing the wound need for additional surgery.  Patient states he understands wished to proceed at this time.  Nadara MustardMarcus V Matisyn Cabeza, MD 02/28/2018, 6:56 AM

## 2018-02-28 NOTE — Social Work (Signed)
CSW acknowledging consult for SNF placement, aware this may be necessary post surgery. Will follow for therapy recommendations.   Peter HutchingIsabel H Peter Cochran, LCSWA [facility] Clinical Social Work [phone]

## 2018-02-28 NOTE — Anesthesia Postprocedure Evaluation (Signed)
Anesthesia Post Note  Patient: Peter Cochran  Procedure(s) Performed: LEFT BELOW KNEE AMPUTATION (Left )     Patient location during evaluation: PACU Anesthesia Type: Regional and MAC Level of consciousness: awake and alert Pain management: pain level controlled Vital Signs Assessment: post-procedure vital signs reviewed and stable Respiratory status: spontaneous breathing, nonlabored ventilation and respiratory function stable Cardiovascular status: stable and blood pressure returned to baseline Postop Assessment: no apparent nausea or vomiting Anesthetic complications: no    Last Vitals:  Vitals:   02/28/18 1208 02/28/18 1223  BP: 95/62 96/62  Pulse: 93 88  Resp: 18 (!) 23  Temp:  (!) 36.3 C  SpO2: 100% 100%    Last Pain:  Vitals:   02/28/18 1223  PainSc: 0-No pain                 Lynda Rainwater

## 2018-02-28 NOTE — Transfer of Care (Signed)
Immediate Anesthesia Transfer of Care Note  Patient: Peter Cochran  Procedure(s) Performed: LEFT BELOW KNEE AMPUTATION (Left )  Patient Location: PACU  Anesthesia Type:MAC combined with regional for post-op pain  Level of Consciousness: awake and patient cooperative  Airway & Oxygen Therapy: Patient Spontanous Breathing  Post-op Assessment: Report given to RN, Post -op Vital signs reviewed and stable and Patient moving all extremities  Post vital signs: Reviewed and stable  Last Vitals:  Vitals Value Taken Time  BP    Temp    Pulse 94 02/28/2018 11:23 AM  Resp 15 02/28/2018 11:23 AM  SpO2 99 % 02/28/2018 11:23 AM  Vitals shown include unvalidated device data.  Last Pain:  Vitals:   02/28/18 0845  PainSc: 0-No pain      Patients Stated Pain Goal: 3 (74/[id number]/49 [id number])  Complications: No apparent anesthesia complications

## 2018-02-28 NOTE — Anesthesia Procedure Notes (Signed)
Anesthesia Regional Block: Popliteal block   Pre-Anesthetic Checklist: ,, timeout performed, Correct Patient, Correct Site, Correct Laterality, Correct Procedure, Correct Position, site marked, Risks and benefits discussed,  Surgical consent,  Pre-op evaluation,  At surgeon's request and post-op pain management  Laterality: Left  Prep: chloraprep       Needles:  Injection technique: Single-shot  Needle Type: Stimiplex     Needle Length: 9cm  Needle Gauge: 21     Additional Needles:   Procedures:,,,, ultrasound used (permanent image in chart),,,,  Narrative:  Start time: 02/28/2018 8:35 AM End time: 02/28/2018 8:41 AM Injection made incrementally with aspirations every 5 mL.  Performed by: Personally  Anesthesiologist: Lowella CurbMiller, Joyous Gleghorn Ray, MD       20ml 0.5% ropiviacaine for left femoral block in addition to Popliteal block

## 2018-02-28 NOTE — Anesthesia Preprocedure Evaluation (Signed)
Anesthesia Evaluation  Patient identified by MRN, date of birth, ID band Patient awake    Reviewed: Allergy & Precautions, NPO status , Patient's Chart, lab work & pertinent test results, reviewed documented beta blocker date and time   Airway Mallampati: II  TM Distance: >3 FB Neck ROM: Full    Dental  (+) Dental Advisory Given   Pulmonary Current Smoker,    breath sounds clear to auscultation       Cardiovascular + Peripheral Vascular Disease and +CHF   Rhythm:Regular Rate:Normal  '19 TTE - EF 25-30%, inferolateral and apical akinesis, diastolic dysfunction, mildly dilated LA   Neuro/Psych  Neuromuscular disease negative psych ROS   GI/Hepatic negative GI ROS, Neg liver ROS,   Endo/Other  Hypothyroidism   Renal/GU negative Renal ROS  negative genitourinary   Musculoskeletal negative musculoskeletal ROS (+)   Abdominal   Peds  Hematology  (+) anemia ,   Anesthesia Other Findings   Reproductive/Obstetrics                             Anesthesia Physical  Anesthesia Plan  ASA: IV  Anesthesia Plan: MAC and Regional   Post-op Pain Management:    Induction: Intravenous  PONV Risk Score and Plan: 1 and Treatment may vary due to age or medical condition and Ondansetron  Airway Management Planned: Simple Face Mask  Additional Equipment: None  Intra-op Plan:   Post-operative Plan:   Informed Consent: I have reviewed the patients History and Physical, chart, labs and discussed the procedure including the risks, benefits and alternatives for the proposed anesthesia with the patient or authorized representative who has indicated his/her understanding and acceptance.   Dental advisory given  Plan Discussed with: CRNA and Anesthesiologist  Anesthesia Plan Comments:         Anesthesia Quick Evaluation

## 2018-02-28 NOTE — Progress Notes (Signed)
Patient stated that the Foley was placed before admission by Urologist due to kidney and bladder dysfunction. Foley is to remain until Urologist assess. Ilean SkillVeronica Larraine Argo LPN

## 2018-02-28 NOTE — Op Note (Signed)
   Date of Surgery: 02/28/2018  INDICATIONS: Mr. Fraser Dinixon is a 62 y.o.-year-old male who has gangrenous left foot and venous ulcers left leg.  PREOPERATIVE DIAGNOSIS: gangrene left fott  POSTOPERATIVE DIAGNOSIS: Same.  PROCEDURE: Transtibial amputation Application of Prevena wound VAC  SURGEON: Lajoyce Cornersuda, M.D.  ANESTHESIA:  general  IV FLUIDS AND URINE: See anesthesia.  ESTIMATED BLOOD LOSS: min mL.  COMPLICATIONS: None.  DESCRIPTION OF PROCEDURE: The patient was brought to the operating room and underwent a general anesthetic. After adequate levels of anesthesia were obtained patient's lower extremity was prepped using DuraPrep draped into a sterile field. A timeout was called. The foot was draped out of the sterile field with impervious stockinette. A transverse incision was made 11 cm distal to the tibial tubercle. This curved proximally and a large posterior flap was created. The tibia was transected 1 cm proximal to the skin incision. The fibula was transected just proximal to the tibial incision. The tibia was beveled anteriorly. A large posterior flap was created. The sciatic nerve was pulled cut and allowed to retract. The vascular bundles were suture ligated with 2-0 silk. The deep and superficial fascial layers were closed using #1 Vicryl. The skin was closed using staples and 2-0 nylon. The wound was covered with a Prevena wound VAC and Restor VAC There was a good suction fit. A prosthetic shrinker was applied. Patient was extubated taken to the PACU in stable condition.   DISCHARGE PLANNING:  Antibiotic duration:24 hours  Weightbearing: NWB Left  Pain medication: opoid pathway  Dressing care/ Wound VAC:VAC do not remove  Discharge to: SNF  Follow-up: In the office 1 week post operative.  Aldean BakerMarcus Nathaniel Wakeley, MD [facility] 11:31 AM

## 2018-03-01 ENCOUNTER — Encounter (HOSPITAL_COMMUNITY): Payer: Self-pay | Admitting: Orthopedic Surgery

## 2018-03-01 NOTE — Plan of Care (Signed)
  Problem: Education: Goal: Knowledge of General Education information will improve Description: Including pain rating scale, medication(s)/side effects and non-pharmacologic comfort measures Outcome: Progressing   Problem: Health Behavior/Discharge Planning: Goal: Ability to manage health-related needs will improve Outcome: Progressing   Problem: Clinical Measurements: Goal: Will remain free from infection Outcome: Progressing   

## 2018-03-01 NOTE — Progress Notes (Signed)
Subjective: 1 Day Post-Op Procedure(s) (LRB): LEFT BELOW KNEE AMPUTATION (Left) Patient reports pain as mild.    Objective: Vital signs in last 24 hours: Temp:  [97.2 F (36.2 C)-99.9 F (37.7 C)] 98.6 F (37 C) (08/17 1014) Pulse Rate:  [88-145] 101 (08/17 1014) Resp:  [16-28] 18 (08/17 1014) BP: (83-107)/(52-72) 92/52 (08/17 1014) SpO2:  [94 %-100 %] 97 % (08/17 1014) Weight:  [94.7 kg] 94.7 kg (08/16 1339)  Intake/Output from previous day: 08/16 0701 - 08/17 0700 In: 1595.2 [P.O.:1020; I.V.:500; IV Piggyback:0.2] Out: 950 [Urine:875; Blood:75] Intake/Output this shift: Total I/O In: 480 [P.O.:480] Out: -   No results for input(s): HGB in the last 72 hours. No results for input(s): WBC, RBC, HCT, PLT in the last 72 hours. No results for input(s): NA, K, CL, CO2, BUN, CREATININE, GLUCOSE, CALCIUM in the last 72 hours. No results for input(s): LABPT, INR in the last 72 hours.  Compartment soft left thigh    Assessment/Plan: 1 Day Post-Op Procedure(s) (LRB): LEFT BELOW KNEE AMPUTATION (Left) Up with therapy D/C IV fluids Discharge home with home health-pt prefers to go home. Needs PT    Peter Cochran W Peter Cochran 03/01/2018, 11:08 AM

## 2018-03-01 NOTE — Evaluation (Signed)
Physical Therapy Evaluation Patient Details Name: Peter SimonsRobert T Cochran MRN: [id number] DOB: 01/26/1956 Today's Date: 03/01/2018   History of Present Illness  Pt. is a [age] y.o. M with significant PMH of CHF and PAD who is status post left transtibial amputation.  Clinical Impression  At baseline, patient is nonambulatory, needs assistance for transfers, and can self propel a manual wheelchair. On PT evaluation, patient requiring two person moderate assistance for lateral transfer to right. Education provided on contracture prevention with positioning and amputee exercises. Patient and patient wife now agreeable to SNF for continued transfer training in order to maximize functional independence. Will follow acutely.    Follow Up Recommendations SNF    Equipment Recommendations  None recommended by PT    Recommendations for Other Services OT consult     Precautions / Restrictions Precautions Precautions: Fall Required Braces or Orthoses: Other Brace/Splint Other Brace/Splint: left limb guard Restrictions Weight Bearing Restrictions: No      Mobility  Bed Mobility Overal bed mobility: Needs Assistance Bed Mobility: Supine to Sit     Supine to sit: Min assist     General bed mobility comments: min assist for trunk elevation  Transfers Overall transfer level: Needs assistance Equipment used: None Transfers: Squat Pivot Transfers     Squat pivot transfers: Mod assist;+2 physical assistance;Max assist     General transfer comment: Mod-max assist for lateral scooting from bed to chair. Required multiple scoots and patient inefficient with use of BUE's. Tending to lean back during end of transfer and needs cues to lean forward to prevent hips from sliding anteriorly  Ambulation/Gait                Stairs            Wheelchair Mobility    Modified Rankin (Stroke Patients Only)       Balance Overall balance assessment: Needs assistance Sitting-balance support:  Bilateral upper extremity supported;Feet unsupported Sitting balance-Leahy Scale: Fair Sitting balance - Comments: occasional left lateral lean in sitting but patient able to correct                                     Pertinent Vitals/Pain Pain Assessment: Faces Faces Pain Scale: Hurts little more Pain Location: left residual limb Pain Descriptors / Indicators: Grimacing;Guarding Pain Intervention(s): Limited activity within patient's tolerance;Monitored during session    Home Living Family/patient expects to be discharged to:: Private residence Living Arrangements: Spouse/significant other Available Help at Discharge: Family Type of Home: House Home Access: Ramped entrance     Home Layout: One level Home Equipment: Environmental consultantWalker - 2 wheels;Wheelchair - manual Additional Comments: Hires nephew to assist when wife is at work during the day. Has HHPT/HHOT.    Prior Function Level of Independence: Needs assistance   Gait / Transfers Assistance Needed: Patient transfers by reaching and pulling up on sink to stand. Sits in the recliner for the majority of the day but reports to transfer to the wheelchair his wife "holds his right arm and he takes a couple baby steps over to it." Mostly nonambulatory.  ADL's / Homemaking Assistance Needed: Patient requires maximal assistance for ADL's. Uses diaper and wife changes him throughout the day. Unable to use urinal due to hernia. Does state he [facility] for [facility] movement         Hand Dominance        Extremity/Trunk Assessment  Upper Extremity Assessment Upper Extremity Assessment: Generalized weakness    Lower Extremity Assessment Lower Extremity Assessment: Generalized weakness;LLE deficits/detail LLE Deficits / Details: s/p left transtibial amputation. knee resting in ~10 degrees from neutral       Communication   Communication: No difficulties  Cognition Arousal/Alertness:  Awake/alert Behavior During Therapy: WFL for tasks assessed/performed Overall Cognitive Status: Within Functional Limits for tasks assessed                                 General Comments: Somewhat decreased insight into deficits      General Comments General comments (skin integrity, edema, etc.): White discoloration along RLE    Exercises Amputee Exercises Quad Sets: 5 reps;Left Straight Leg Raises: Left;10 reps   Assessment/Plan    PT Assessment Patient needs continued PT services  PT Problem List Decreased strength;Decreased range of motion;Decreased activity tolerance;Decreased balance;Decreased mobility;Pain       PT Treatment Interventions DME instruction;Functional mobility training;Therapeutic activities;Therapeutic exercise;Balance training;Patient/family education;Wheelchair mobility training    PT Goals (Current goals can be found in the Care Plan section)  Acute Rehab PT Goals Patient Stated Goal: "go outside." PT Goal Formulation: With patient Time For Goal Achievement: 03/15/18 Potential to Achieve Goals: Fair    Frequency Min 2X/week   Barriers to discharge        Co-evaluation               AM-PAC PT "6 Clicks" Daily Activity  Outcome Measure Difficulty turning over in bed (including adjusting bedclothes, sheets and blankets)?: None Difficulty moving from lying on back to sitting on the side of the bed? : Unable Difficulty sitting down on and standing up from a chair with arms (e.g., wheelchair, bedside commode, etc,.)?: Unable Help needed moving to and from a bed to chair (including a wheelchair)?: A Lot Help needed walking in hospital room?: Total Help needed climbing 3-5 steps with a railing? : Total 6 Click Score: 10    End of Session Equipment Utilized During Treatment: Gait belt Activity Tolerance: Patient tolerated treatment well Patient left: in chair;with call bell/phone within reach;with family/visitor present Nurse  Communication: Mobility status PT Visit Diagnosis: Other abnormalities of gait and mobility (R26.89);Muscle weakness (generalized) (M62.81)    Time: [id number] PT Time Calculation (min) (ACUTE ONLY): 23 min   Charges:   PT Evaluation $PT Eval Moderate Complexity: 1 Mod PT Treatments $Therapeutic Activity: 8-22 mins        Laurina Bustlearoline Darshay Deupree, PT, DPT Acute Rehabilitation Services  Pager: [phone]   Vanetta MuldersCarloine H Clotilda Hafer 03/01/2018, 1:37 PM

## 2018-03-02 NOTE — Progress Notes (Signed)
Subjective: 2 Days Post-Op Procedure(s) (LRB): LEFT BELOW KNEE AMPUTATION (Left) Patient reports pain as mild.    Objective: Vital signs in last 24 hours: Temp:  [98.6 F (37 C)-101.3 F (38.5 C)] 99.2 F (37.3 C) (08/18 0551) Pulse Rate:  [101-122] 110 (08/18 0551) Resp:  [18-28] 18 (08/18 0551) BP: (92-100)/(52-63) 100/62 (08/18 0551) SpO2:  [94 %-98 %] 97 % (08/18 0551)  Intake/Output from previous day: 08/17 0701 - 08/18 0700 In: 840 [P.O.:840] Out: 925 [Urine:925] Intake/Output this shift: Total I/O In: 240 [P.O.:240] Out: -     Assessment/Plan: 2 Days Post-Op Procedure(s) (LRB): LEFT BELOW KNEE AMPUTATION (Left) Up with therapy Discharge to SNF    [facility] Pott 03/02/2018, 10:13 AM

## 2018-03-03 NOTE — Plan of Care (Signed)
  Problem: Pain Managment: Goal: General experience of comfort will improve Outcome: Progressing   

## 2018-03-03 NOTE — Progress Notes (Signed)
Patient ID: Peter SimonsRobert T Heidel, male   DOB: 06-25-1956, 62 y.o.   MRN: [id number] Patient is status post transtibial amputation.  There is 80 cc in the wound VAC canister with persistent drainage.  Plan for discharge to skilled nursing.  Patient has no complaints this morning.  He has the stump protector in place and the stump shrinker on top of the sponge.

## 2018-03-03 NOTE — [place] FL2 (Signed)
[place] MEDICAID FL2 LEVEL OF CARE SCREENING TOOL     IDENTIFICATION  Patient Name: Peter Cochran Birthdate: [birth year] Sex: male Admission Date (Current Location): 02/28/2018  [facility] [place]:  [employer]   Facility and Address:  [facility], [address], [place], [place] [address]      Provider Number: [id number]  Attending Physician Name and Address:  Nadara Mustarduda, Marcus V, MD  Relative Name and Phone Number:   Mercer PodBlanche Key, wife, [phone]  Current Level of Care: Hospital Recommended Level of Care: Skilled Nursing Facility Prior Approval Number:    Date Approved/Denied:   PASRR Number: [id number]  Discharge Plan: SNF    Current Diagnoses: Patient Active Problem List   Diagnosis Date Noted  . Hx of BKA, left (HCC) 02/28/2018  . Antibiotic long-term use   . AKI (acute kidney injury) (HCC)   . Severe protein-calorie malnutrition (HCC) 12/12/2017  . Bilateral lower leg cellulitis   . Gangrene of toe of left foot (HCC)   . Pressure injury of skin 12/07/2017  . Sepsis due to cellulitis (HCC) 12/06/2017  . Chronic systolic heart failure (HCC) 10/28/2017  . Dilated cardiomyopathy (HCC) 10/27/2017  . Mitral regurgitation 10/27/2017  . Elevated troponin 10/27/2017  . Elevated brain natriuretic peptide (BNP) level 10/27/2017  . PAD (peripheral artery disease) (HCC) 10/27/2017  . 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 Fatigue 10/22/2017    Orientation RESPIRATION BLADDER Height & Weight     Self, Time, Situation, Place  Normal Continent, Indwelling catheter Weight: 208 lb 12.4 oz (94.7 kg) Height:  5\' 11"  (180.3 cm)  BEHAVIORAL SYMPTOMS/MOOD NEUROLOGICAL BOWEL NUTRITION STATUS      Continent Diet(see discharge summary)  AMBULATORY STATUS COMMUNICATION OF NEEDS Skin   Extensive Assist Verbally Surgical wounds, Wound Vac, Other (Comment)(wound on sacrum with foam; surgical incision on left  leg with wound vac on stump)                       Personal Care Assistance Level of Assistance  Bathing, Feeding, Dressing Bathing Assistance: Maximum assistance Feeding assistance: Independent Dressing Assistance: Maximum assistance     Functional Limitations Info  Sight, Hearing, Speech Sight Info: Adequate Hearing Info: Adequate Speech Info: Adequate    SPECIAL CARE FACTORS FREQUENCY  OT (By licensed OT), PT (By licensed PT)     PT Frequency: 5x week OT Frequency: 5x week            Contractures Contractures Info: Not present    Additional Factors Info  Code Status, Allergies, Psychotropic Code Status Info: Full Code Allergies Info: OXYTETRACYCLINE, SULFA ANTIBIOTICS  Psychotropic Info: DULoxetine (CYMBALTA) DR capsule 30 mg daily at bedtime         Current Medications (03/03/2018):  This is the current hospital active medication list Current Facility-Administered Medications  Medication Dose Route Frequency Provider Last Rate Last Dose  . acetaminophen (TYLENOL) tablet 325-650 mg  325-650 mg Oral Q6H PRN Nadara Mustarduda, Marcus V, MD   650 mg at 03/02/18 1335  . aspirin EC tablet 81 mg  81 mg Oral Daily Nadara Mustarduda, Marcus V, MD   81 mg at 03/03/18 [id number]  . atorvastatin (LIPITOR) tablet 40 mg  40 mg Oral Daily Nadara Mustarduda, Marcus V, MD   40 mg at 03/03/18 0957  . bisacodyl (DULCOLAX) suppository 10 mg  10 mg Rectal Daily PRN Nadara Mustarduda, Marcus V, MD      .  carvedilol (COREG) tablet 3.125 mg  3.125 mg Oral BID WC Nadara Mustarduda, Marcus V, MD   3.125 mg at 03/03/18 0957  . clopidogrel (PLAVIX) tablet 75 mg  75 mg Oral Q breakfast Nadara Mustarduda, Marcus V, MD   75 mg at 03/03/18 0957  . docusate sodium (COLACE) capsule 100 mg  100 mg Oral BID Nadara Mustarduda, Marcus V, MD   100 mg at 03/03/18 0958  . DULoxetine (CYMBALTA) DR capsule 30 mg  30 mg Oral QHS Nadara Mustarduda, Marcus V, MD   30 mg at 03/02/18 2059  . gabapentin (NEURONTIN) capsule 300 mg  300 mg Oral TID Nadara Mustarduda, Marcus V, MD   300 mg at 03/03/18 0957  . HYDROmorphone  (DILAUDID) injection 0.5-1 mg  0.5-1 mg Intravenous Q4H PRN Nadara Mustarduda, Marcus V, MD   1 mg at 03/01/18 1142  . levothyroxine (SYNTHROID, LEVOTHROID) tablet 125 mcg  125 mcg Oral QAC breakfast Nadara Mustarduda, Marcus V, MD   125 mcg at 03/03/18 [phone]  . magnesium citrate solution 1 Bottle  1 Bottle Oral Once PRN Nadara Mustarduda, Marcus V, MD      . methocarbamol (ROBAXIN) tablet 500 mg  500 mg Oral Q6H PRN Nadara Mustarduda, Marcus V, MD   500 mg at 03/01/18 2111   Or  . methocarbamol (ROBAXIN) 500 mg in dextrose 5 % 50 mL IVPB  500 mg Intravenous Q6H PRN Nadara Mustarduda, Marcus V, MD      . metoCLOPramide (REGLAN) tablet 5-10 mg  5-10 mg Oral Q8H PRN Nadara Mustarduda, Marcus V, MD       Or  . metoCLOPramide (REGLAN) injection 5-10 mg  5-10 mg Intravenous Q8H PRN Nadara Mustarduda, Marcus V, MD      . ondansetron [facility](ZOFRAN) tablet 4 mg  4 mg Oral Q6H PRN Nadara Mustarduda, Marcus V, MD       Or  . ondansetron [employer](ZOFRAN) injection 4 mg  4 mg Intravenous Q6H PRN Nadara Mustarduda, Marcus V, MD      . oxyCODONE (Oxy IR/ROXICODONE) immediate release tablet 10-15 mg  10-15 mg Oral Q4H PRN Nadara Mustarduda, Marcus V, MD   15 mg at 03/03/18 0958  . oxyCODONE (Oxy IR/ROXICODONE) immediate release tablet 5-10 mg  5-10 mg Oral Q4H PRN Nadara Mustarduda, Marcus V, MD   10 mg at 03/01/18 0109  . polyethylene glycol (MIRALAX / GLYCOLAX) packet 17 g  17 g Oral Daily PRN Nadara Mustarduda, Marcus V, MD      . sacubitril-valsartan (ENTRESTO) 24-26 mg per tablet  1 tablet Oral Once per day on Mon Wed Fri Duda, Marcus V, MD      . spironolactone (ALDACTONE) tablet 25 mg  25 mg Oral Daily Nadara Mustarduda, Marcus V, MD   25 mg at 03/03/18 0958  . tamsulosin (FLOMAX) capsule 0.4 mg  0.4 mg Oral QPC breakfast Nadara Mustarduda, Marcus V, MD   0.4 mg at 03/03/18 [id number]     Discharge Medications: Please see discharge summary for a list of discharge medications.  Relevant Imaging Results:  Relevant Lab Results:   Additional Information SS# [id number]  Jonathandavid Marlett H Page[place], [place]

## 2018-03-03 NOTE — Clinical Social Work Note (Signed)
Clinical Social Work Assessment  Patient Details  Name: RAPHAEL FITZPATRICK MRN: [id number] Date of Birth: 01/25/1956  Date of referral:  03/03/18               Reason for consult:  Facility Placement, Discharge Planning                Permission sought to share information with:  Family Supports, Customer service manager Permission granted to share information::  Yes, Verbal Permission Granted  Name::     Lucien Mons  Agency::  SNFs in [place]  Relationship::  wife  Contact Information:  [phone]  Housing/Transportation Living arrangements for the past 2 months:  [place] of Information:  Patient, Spouse Patient Interpreter Needed:  None Criminal Activity/Legal Involvement Pertinent to Current Situation/Hospitalization:  No - Comment as needed Significant Relationships:  Spouse, Adult Children Lives with:  Spouse Do you feel safe going back to the place where you live?  Yes Need for family participation in patient care:  Yes (Comment)  Care giving concerns: Pt has new bka, previously was able to move around in wheelchair but now requiring more assistance with transfers. Pt and pt wife requesting SNF placement before returning home.     Social Worker assessment / plan:  CSW met with pt at bedside, CSW introduced self and role. Pt acknowledging visit, states that he usually can get around with wheelchair but "I am so weak,". Pt states that he lives with his wife in [place] and would like SNF placement there. He has been getting therapies and care with home health and if he cannot get SNF in [facility] he would like to resume home health. CSW explained SNF referral process and that we would like to check with insurance coverage for referrals. Pt states understanding, pt requested CSW f/u with wife in regards to SNF referral process. Pt prefers [facility].  Employment status:  Disabled (Comment on whether or not  currently receiving Disability) Insurance information:  Managed Care PT Recommendations:  [place], 24 Hour Supervision Information / Referral to community resources:  [place]  Patient/Family's Response to care:  Pt understanding of recommendations and requesting SNF placement.   Patient/Family's Understanding of and Emotional Response to Diagnosis, Current Treatment, and Prognosis:  Pt states understanding of diagnosis, current treatment and prognosis. Pt understanding of strengths and current limitations. Pt hopeful for placement.   Emotional Assessment Appearance:  Appears older than stated age Attitude/Demeanor/Rapport:  Engaged, Ambitious Affect (typically observed):  Accepting, Adaptable, Appropriate, Pleasant Orientation:  Oriented to Self, Oriented to Place, Oriented to  Time, Oriented to Situation Alcohol / Substance use:  Tobacco Use Psych involvement (Current and /or in the community):  No (Comment)  Discharge Needs  Concerns to be addressed:  Discharge Planning Concerns, Care Coordination Readmission within the last 30 days:  No Current discharge risk:  Physical Impairment, Dependent with Mobility Barriers to Discharge:  Ship broker, Continued Medical Work up   [employer], [place] 03/03/2018, 12:09 PM

## 2018-03-03 NOTE — Evaluation (Signed)
Occupational Therapy Evaluation Patient Details Name: Peter SimonsRobert T Cochran MRN: [id number] DOB: 1955/08/22 Today's Date: 03/03/2018    History of Present Illness Pt. is a 62 y.o. M with significant PMH of CHF and PAD who is status post left transtibial amputation.   Clinical Impression   PTA, pt was requiring assistance for daily ADL tasks but was able to transfer [facility] with assistance from wife. He was additionally able to assist with UB dressing tasks at times. Pt currently requiring max assistance for LB ADL, max assist +2 for squat-pivot toilet transfers, and min assist for UB ADL. Pt reports that he has been sitting up in recliner chair for 2 days per his request. Educated pt concerning need to change positions to decrease pressure on bottom and risk for skin breakdown. He and wife verbalize understanding. Pt positioned in bed at close of my session and nursing staff planning to assist with OOB again later today. Recommend SNF level rehabilitation to maximize independence and safety with ADL and functional mobility prior to returning home. Will continue to follow while admitted.     Follow Up Recommendations  SNF;Supervision/Assistance - 24 hour    Equipment Recommendations  Other (comment);3 in 1 bedside commode(Drop-arm 3-in-1)    Recommendations for Other Services       Precautions / Restrictions Precautions Precautions: Fall Precaution Comments: L transtibial amputation Required Braces or Orthoses: Other Brace/Splint Other Brace/Splint: left limb guard Restrictions Weight Bearing Restrictions: No      Mobility Bed Mobility Overal bed mobility: Needs Assistance Bed Mobility: Sit to Supine       Sit to supine: Max assist   General bed mobility comments: Assist to bring BLE back into bed.   Transfers Overall transfer level: Needs assistance Equipment used: None Transfers: Squat Pivot Transfers     Squat pivot transfers: Max assist;+2 physical  assistance     General transfer comment: Max assist to complete squat-pivot transfer. Unable to achieve fully upright standing position.     Balance Overall balance assessment: Needs assistance Sitting-balance support: Bilateral upper extremity supported;Feet unsupported Sitting balance-Leahy Scale: Fair     Standing balance support: Bilateral upper extremity supported;During functional activity Standing balance-Leahy Scale: Zero                             ADL either performed or assessed with clinical judgement   ADL Overall ADL's : Needs assistance/impaired Eating/Feeding: Set up;Sitting   Grooming: Set up;Sitting   Upper Body Bathing: Minimal assistance;Sitting   Lower Body Bathing: Maximal assistance;Sitting/lateral leans   Upper Body Dressing : Minimal assistance;Sitting   Lower Body Dressing: Maximal assistance;Sitting/lateral leans   Toilet Transfer: Maximal assistance;+2 for physical assistance;Squat-pivot;RW Toilet Transfer Details (indicate cue type and reason): unable to achieve full standing position Toileting- Clothing Manipulation and Hygiene: Sitting/lateral lean;Maximal assistance       Functional mobility during ADLs: Maximal assistance;+2 for physical assistance;Rolling walker(squat-pivot only) General ADL Comments: Pt limited by activity tolerance for ADL participation. Requires cues for motivation.      Vision Patient Visual Report: No change from baseline Vision Assessment?: No apparent visual deficits     Perception     Praxis      Pertinent Vitals/Pain Pain Assessment: Faces Faces Pain Scale: Hurts even more Pain Location: left residual limb Pain Descriptors / Indicators: Grimacing;Guarding Pain Intervention(s): Limited activity within patient's tolerance;Monitored during session;Repositioned     Hand Dominance     Extremity/Trunk  Assessment Upper Extremity Assessment Upper Extremity Assessment: Generalized weakness    Lower Extremity Assessment Lower Extremity Assessment: LLE deficits/detail LLE Deficits / Details: Pt s/p transtibial amputation; wearing limb guard       Communication Communication Communication: No difficulties   Cognition Arousal/Alertness: Awake/alert Behavior During Therapy: WFL for tasks assessed/performed Overall Cognitive Status: Within Functional Limits for tasks assessed                                 General Comments: Somewhat decreased insight into deficits   General Comments  Educated on safe positioning of LLE.     Exercises     Shoulder Instructions      Home Living Family/patient expects to be discharged to:: Private residence Living Arrangements: Spouse/significant other Available Help at Discharge: Family Type of Home: House Home Access: Ramped entrance     Home Layout: One level     Bathroom Shower/Tub: (does wash-ups only)   Bathroom Toilet: (uses BSC)     Home Equipment: Environmental consultantWalker - 2 wheels;Wheelchair - manual;Bedside commode   Additional Comments: Hires nephew to assist when wife is at work during the day. Has HHPT/HHOT.      Prior Functioning/Environment Level of Independence: Needs assistance  Gait / Transfers Assistance Needed: Assistance required to transfer to and from w/c. Sits in recliner majority of the day. States he was working on "walking" with HHPT.  ADL's / Homemaking Assistance Needed: Max assist for ADL. Has a foley for urination. [facility] for [facility] movement.             OT Problem List: Decreased strength;Decreased range of motion;Decreased activity tolerance;Impaired balance (sitting and/or standing);Decreased safety awareness;Decreased knowledge of use of DME or AE;Decreased knowledge of precautions;Pain      OT Treatment/Interventions: Self-care/ADL training;Therapeutic exercise;Energy conservation;DME and/or AE instruction;Therapeutic activities;Patient/family education;Balance  training    OT Goals(Current goals can be found in the care plan section) Acute Rehab OT Goals Patient Stated Goal: "go home" OT Goal Formulation: With patient/family Time For Goal Achievement: 03/17/18 Potential to Achieve Goals: Fair ADL Goals Pt Will Perform Upper Body Dressing: with set-up;sitting Pt Will Perform Lower Body Dressing: with min assist;sitting/lateral leans Pt Will Transfer to Toilet: with min assist;squat pivot transfer;bedside commode(drop-arm BSC) Pt Will Perform Toileting - Clothing Manipulation and hygiene: with min assist;sitting/lateral leans  OT Frequency: Min 2X/week   Barriers to D/C:            Co-evaluation              AM-PAC PT "6 Clicks" Daily Activity     Outcome Measure Help from another person eating meals?: None Help from another person taking care of personal grooming?: None Help from another person toileting, which includes using toliet, bedpan, or urinal?: A Lot Help from another person bathing (including washing, rinsing, drying)?: A Lot Help from another person to put on and taking off regular upper body clothing?: A Little Help from another person to put on and taking off regular lower body clothing?: A Lot 6 Click Score: 17   End of Session Equipment Utilized During Treatment: Gait belt;Rolling walker(L limb guard) Nurse Communication: Mobility status  Activity Tolerance: Patient tolerated treatment well Patient left: in bed;with call bell/phone within reach;with family/visitor present  OT Visit Diagnosis: Other abnormalities of gait and mobility (R26.89);Pain Pain - Right/Left: Left Pain - part of body: Leg  Time: 0930-1000 OT Time Calculation (min): 30 min Charges:  OT General Charges $OT Visit: 1 Visit OT Evaluation $OT Eval Moderate Complexity: 1 Mod OT Treatments $Self Care/Home Management : 8-22 mins  Doristine Section, MS OTR/L  Pager: [phone]   Breeann Reposa A Skylan Lara 03/03/2018, 1:16 PM

## 2018-03-03 NOTE — Social Work (Addendum)
CSW spoke with pt at bedside, pt requested CSW call pt wife. Pt wife did not answer, HIPAA compliant message left.   Pt preferred SNFs- [facility] and [facility] do not accept [facility].   12:56pm- CSW spoke with [facility], per [facility] pt has met his out of network benefit and therefore would have a "neutral benefit" and could discharge to SNF that is out of network.    Alexander Mt, [place] Work [phone]

## 2018-03-04 MED ORDER — OXYCODONE-ACETAMINOPHEN 5-325 MG PO TABS: 1.0000 | ORAL_TABLET | ORAL | 0 refills | Status: DC | PRN

## 2018-03-04 NOTE — Discharge Summary (Signed)
Discharge Diagnoses:  Active Problems:   Hx of BKA, left (HCC)   Surgeries: Procedure(s): LEFT BELOW KNEE AMPUTATION on 02/28/2018    Consultants:   Discharged Condition: Improved  Hospital Course: Alger SimonsRobert T Lauderback is an 62 y.o. male who was admitted 02/28/2018 with a chief complaint of gangrene left foot, with a final diagnosis of Gangrene Left Foot.  Patient was brought to the operating room on 02/28/2018 and underwent Procedure(s): LEFT BELOW KNEE AMPUTATION.    Patient was given perioperative antibiotics:  Anti-infectives (From admission, onward)   Start     Dose/Rate Route Frequency Ordered Stop   02/28/18 1600  ceFAZolin (ANCEF) IVPB 1 g/50 mL premix     1 g 100 mL/hr over 30 Minutes Intravenous Every 6 hours 02/28/18 1350 03/01/18 0525   02/28/18 0731  ceFAZolin (ANCEF) 2-4 GM/100ML-% IVPB    Note to Pharmacy:  Block, Sarah   : cabinet override      02/28/18 0731 02/28/18 1016   02/28/18 0730  ceFAZolin (ANCEF) IVPB 2g/100 mL premix     2 g 200 mL/hr over 30 Minutes Intravenous On call to O.R. 02/28/18 0725 02/28/18 1016    .  Patient was given sequential compression devices, early ambulation, and aspirin for DVT prophylaxis.  Recent vital signs:  Patient Vitals for the past 24 hrs:  BP Temp Temp src Pulse Resp SpO2  03/04/18 0512 (!) 92/53 98.6 F (37 C) Oral 94 16 95 %  03/03/18 2127 (!) 98/54 100.3 F (37.9 C) Oral (!) 102 - 92 %  03/03/18 1559 (!) 90/50 99.3 F (37.4 C) Oral (!) 101 16 94 %  .  Recent laboratory studies: No results found.  Discharge Medications:   Allergies as of 03/04/2018      Reactions   Oxytetracycline Itching, Swelling, Other (See Comments)   Facial swelling   Sulfa Antibiotics Rash      Medication List    STOP taking these medications   acetaminophen 500 MG tablet Commonly known as:  TYLENOL   amoxicillin-clavulanate 875-125 MG tablet Commonly known as:  AUGMENTIN   diphenhydrAMINE 25 mg capsule Commonly known as:   BENADRYL   traMADol 50 MG tablet Commonly known as:  ULTRAM   triamcinolone cream 0.1 % Commonly known as:  KENALOG     TAKE these medications   aspirin EC 81 MG tablet Take 81 mg by mouth daily.   atorvastatin 40 MG tablet Commonly known as:  LIPITOR Take 1 tablet (40 mg total) by mouth daily.   carvedilol 3.125 MG tablet Commonly known as:  COREG Take 1 tablet (3.125 mg total) by mouth 2 (two) times daily with a meal.   clopidogrel 75 MG tablet Commonly known as:  PLAVIX Take 1 tablet (75 mg total) by mouth daily with breakfast.   DULoxetine 30 MG capsule Commonly known as:  CYMBALTA Take 30 mg by mouth at bedtime.   furosemide 20 MG tablet Commonly known as:  LASIX Take 1 tablet (20 mg total) by mouth 3 (three) times a week. On Monday Wednesday friday What changed:    when to take this  additional instructions   levothyroxine 100 MCG tablet Commonly known as:  SYNTHROID, LEVOTHROID Take 125 mcg by mouth daily before breakfast.   multivitamin with minerals Tabs tablet Take 1 tablet by mouth daily.   oxyCODONE-acetaminophen 5-325 MG tablet Commonly known as:  PERCOCET/ROXICET Take 1 tablet by mouth every 4 (four) hours as needed.   sacubitril-valsartan 24-26 MG Commonly known  as:  ENTRESTO Take 1 tablet by mouth 3 (three) times a week.   spironolactone 25 MG tablet Commonly known as:  ALDACTONE Take 1 tablet (25 mg total) by mouth daily.   tamsulosin 0.4 MG Caps capsule Commonly known as:  FLOMAX Take 1 capsule (0.4 mg total) by mouth daily after breakfast.       Diagnostic Studies: No results found.  Patient benefited maximally from their hospital stay and there were no complications.     Disposition: Discharge disposition: 03-Skilled Nursing Facility      Discharge Instructions    Call MD / Call 911   Complete by:  As directed    If you experience chest pain or shortness of breath, CALL 911 and be transported to the hospital emergency  room.  If you develope a fever above 101 F, pus (white drainage) or increased drainage or redness at the wound, or calf pain, call your surgeon's office.   Call MD / Call 911   Complete by:  As directed    If you experience chest pain or shortness of breath, CALL 911 and be transported to the hospital emergency room.  If you develope a fever above 101 F, pus (white drainage) or increased drainage or redness at the wound, or calf pain, call your surgeon's office.   Constipation Prevention   Complete by:  As directed    Drink plenty of fluids.  Prune juice may be helpful.  You may use a stool softener, such as Colace (over the counter) 100 mg twice a day.  Use MiraLax (over the counter) for constipation as needed.   Constipation Prevention   Complete by:  As directed    Drink plenty of fluids.  Prune juice may be helpful.  You may use a stool softener, such as Colace (over the counter) 100 mg twice a day.  Use MiraLax (over the counter) for constipation as needed.   Diet - low sodium heart healthy   Complete by:  As directed    Diet - low sodium heart healthy   Complete by:  As directed    Increase activity slowly as tolerated   Complete by:  As directed    Increase activity slowly as tolerated   Complete by:  As directed      Follow-up Information    Nadara Mustarduda, Brianne Maina V, MD In 1 week.   Specialty:  Orthopedic Surgery Contact information: [address] [place] [address] [phone]            Signed: Nadara MustardMarcus V Tabetha Haraway 03/04/2018, 6:23 AM

## 2018-03-04 NOTE — Social Work (Addendum)
CSW spoke with pt at bedside, pt given offers. Pt states he would prefer to be close to home so his wife can more easily come and visit him. [facility] CSW will initiate insurance auth- [phone].  11:25am- [facility] [place] [facility] [phone] utilization nurse. Requested facility name and diagnosis code. Information given, await return call [facility] Alroy Dustiffany [facility].   Doy HutchingIsabel H Perrion Cochran, LCSWA [facility] Clinical Social Work [phone]

## 2018-03-04 NOTE — Progress Notes (Signed)
Discharge instructions placed in packet for receiving facility.  Pt discharged in stable condition via [facility] to [facility].  Hector ShadeMoss, Vanshika Jastrzebski GrandyLindsay

## 2018-03-04 NOTE — Social Work (Addendum)
CSW received confirmation for pt to discharged to [facility], under authorization [id number] for 7 days initially given by [facility] representative Annette Stableiffany Barry at [phone] ext. [phone].   Clinical Social Worker facilitated patient discharge including contacting patient family and facility to confirm patient discharge plans.  Clinical information faxed to facility and family agreeable with plan.  CSW arranged ambulance transport via [facility] to [facility] room 112. RN to call [phone] with report  prior to discharge.  Clinical Social Worker will sign off for now as social work intervention is no longer needed. Please consult us again if new need arises.  Doy HutchingIsabel H Roi Jafari, [place] Clinical Social Worker [phone]

## 2018-03-04 NOTE — Clinical Social Work Placement (Signed)
   CLINICAL SOCIAL WORK PLACEMENT  NOTE [facility] RN to call report to [phone]  Date:  03/04/2018  Patient Details  Name: Peter Cochran MRN: [id number] Date of Birth: Dec 05, 1955  Clinical Social Work is seeking post-discharge placement for this patient at the Skilled  Nursing Facility level of care (*CSW will initial, date and re-position this form in  chart as items are completed):  Yes   Patient/family provided with [facility] Clinical Social Work Department's list of facilities offering this level of care within the geographic area requested by the patient (or if unable, by the patient's family).  Yes   Patient/family informed of their freedom to choose among providers that offer the needed level of care, that participate in Medicare, Medicaid or managed care program needed by the patient, have an available bed and are willing to accept the patient.  Yes   Patient/family informed of [facility]'s ownership interest in [facility] and [facility], as well as of the fact that they are under no obligation to receive care at these facilities.  PASRR submitted to EDS on       PASRR number received on 02/28/18     Existing PASRR number confirmed on       FL2 transmitted to all facilities in geographic area requested by pt/family on 03/03/18     FL2 transmitted to all facilities within larger geographic area on       Patient informed that his/her managed care company has contracts with or will negotiate with certain facilities, including the following:        Yes   Patient/family informed of bed offers received.  [facility]     Physician recommends and patient chooses bed at      [facility] on 03/04/18.  Patient to be transferred to facility by PTAR     Patient family notified on 03/04/18 of transfer.  Name of family member  notified:  wife Peter Cochran     PHYSICIAN       Additional Comment:    _______________________________________________ Doy HutchingIsabel H Estefan Pattison, LCSWA 03/04/2018, 12:48 PM

## 2018-03-04 NOTE — Progress Notes (Signed)
Report called to Delorise Jacksonenee Jeffries, LPN at [facility].  Hector ShadeMoss, Allene Furuya EsterbrookLindsay

## 2018-03-06 ENCOUNTER — Ambulatory Visit (INDEPENDENT_AMBULATORY_CARE_PROVIDER_SITE_OTHER): Payer: PRIVATE HEALTH INSURANCE | Admitting: Orthopedic Surgery

## 2018-03-06 ENCOUNTER — Telehealth (INDEPENDENT_AMBULATORY_CARE_PROVIDER_SITE_OTHER): Payer: Self-pay | Admitting: *Deleted

## 2018-03-06 ENCOUNTER — Telehealth (INDEPENDENT_AMBULATORY_CARE_PROVIDER_SITE_OTHER): Payer: Self-pay

## 2018-03-06 ENCOUNTER — Telehealth (INDEPENDENT_AMBULATORY_CARE_PROVIDER_SITE_OTHER): Payer: Self-pay | Admitting: Orthopedic Surgery

## 2018-03-06 NOTE — Telephone Encounter (Signed)
Returned call to patient no answer and no answering machine pickup    °

## 2018-03-06 NOTE — Telephone Encounter (Signed)
Pt wife called stating pt is on the Prevention plus 125 therapy unit and says it has a cartridge and is completely full, says there is blood dripping from wound d/t the unit being full. Pt says he was in SNF and he checked himself out and they did not have an extra unit for him. She is using a diaper and plastic wrap around the wound. Pt wants to know what she needs to do. SHe is wanting to know if there is another unit for him. Please advise.  CB# [phone] Marylee Floras(Blanche)

## 2018-03-06 NOTE — Telephone Encounter (Signed)
I called and sw pt. He was to come in this morning for wound vac malfunction and did not show. Pt states that he is not able to find transportation. He said that he will try and come in tomorrow for a nurse only visit. He is to call and let me know what time he will be able to come.

## 2018-03-06 NOTE — Telephone Encounter (Signed)
I called pt and he is on his way. Can you open a time at 10 am for him today.

## 2018-03-07 ENCOUNTER — Ambulatory Visit (INDEPENDENT_AMBULATORY_CARE_PROVIDER_SITE_OTHER): Payer: Self-pay | Admitting: Physician Assistant

## 2018-03-07 ENCOUNTER — Inpatient Hospital Stay (HOSPITAL_COMMUNITY): Payer: PRIVATE HEALTH INSURANCE | Admitting: Anesthesiology

## 2018-03-07 ENCOUNTER — Ambulatory Visit (INDEPENDENT_AMBULATORY_CARE_PROVIDER_SITE_OTHER): Payer: PRIVATE HEALTH INSURANCE | Admitting: Physician Assistant

## 2018-03-07 ENCOUNTER — Encounter (INDEPENDENT_AMBULATORY_CARE_PROVIDER_SITE_OTHER): Payer: Self-pay | Admitting: Physician Assistant

## 2018-03-07 ENCOUNTER — Inpatient Hospital Stay (HOSPITAL_COMMUNITY)
Admission: AD | Admit: 2018-03-07 | Discharge: 2018-03-10 | DRG: 475 | Disposition: A | Discharge status: Home Health | Payer: PRIVATE HEALTH INSURANCE | Attending: Orthopedic Surgery | Admitting: Orthopedic Surgery

## 2018-03-07 ENCOUNTER — Encounter (HOSPITAL_COMMUNITY)
Admission: AD | Disposition: A | Discharge status: Home Health | Payer: Self-pay | Source: Home / Self Care | Attending: Orthopedic Surgery

## 2018-03-07 DIAGNOSIS — D509 Iron deficiency anemia, unspecified: Secondary | ICD-10-CM | POA: Diagnosis present

## 2018-03-07 DIAGNOSIS — Z8349 Family history of other endocrine, nutritional and metabolic diseases: Secondary | ICD-10-CM | POA: Diagnosis not present

## 2018-03-07 DIAGNOSIS — I5022 Chronic systolic (congestive) heart failure: Secondary | ICD-10-CM | POA: Diagnosis present

## 2018-03-07 DIAGNOSIS — T879 Unspecified complications of amputation stump: Secondary | ICD-10-CM | POA: Diagnosis present

## 2018-03-07 DIAGNOSIS — Z881 Allergy status to other antibiotic agents status: Secondary | ICD-10-CM

## 2018-03-07 DIAGNOSIS — Z882 Allergy status to sulfonamides status: Secondary | ICD-10-CM | POA: Diagnosis not present

## 2018-03-07 DIAGNOSIS — Z9862 Peripheral vascular angioplasty status: Secondary | ICD-10-CM | POA: Diagnosis not present

## 2018-03-07 DIAGNOSIS — F329 Major depressive disorder, single episode, unspecified: Secondary | ICD-10-CM | POA: Diagnosis present

## 2018-03-07 DIAGNOSIS — I2510 Atherosclerotic heart disease of native coronary artery without angina pectoris: Secondary | ICD-10-CM | POA: Diagnosis present

## 2018-03-07 DIAGNOSIS — D62 Acute posthemorrhagic anemia: Secondary | ICD-10-CM | POA: Diagnosis not present

## 2018-03-07 DIAGNOSIS — Z79890 Hormone replacement therapy (postmenopausal): Secondary | ICD-10-CM | POA: Diagnosis not present

## 2018-03-07 DIAGNOSIS — T8781 Dehiscence of amputation stump: Principal | ICD-10-CM | POA: Diagnosis present

## 2018-03-07 DIAGNOSIS — I739 Peripheral vascular disease, unspecified: Secondary | ICD-10-CM | POA: Diagnosis not present

## 2018-03-07 DIAGNOSIS — N5089 Other specified disorders of the male genital organs: Secondary | ICD-10-CM | POA: Diagnosis present

## 2018-03-07 DIAGNOSIS — E039 Hypothyroidism, unspecified: Secondary | ICD-10-CM | POA: Diagnosis present

## 2018-03-07 DIAGNOSIS — Z7902 Long term (current) use of antithrombotics/antiplatelets: Secondary | ICD-10-CM | POA: Diagnosis not present

## 2018-03-07 DIAGNOSIS — I96 Gangrene, not elsewhere classified: Secondary | ICD-10-CM | POA: Diagnosis present

## 2018-03-07 DIAGNOSIS — G629 Polyneuropathy, unspecified: Secondary | ICD-10-CM | POA: Diagnosis present

## 2018-03-07 DIAGNOSIS — Z79899 Other long term (current) drug therapy: Secondary | ICD-10-CM

## 2018-03-07 DIAGNOSIS — D649 Anemia, unspecified: Secondary | ICD-10-CM | POA: Diagnosis not present

## 2018-03-07 DIAGNOSIS — D72829 Elevated white blood cell count, unspecified: Secondary | ICD-10-CM | POA: Diagnosis not present

## 2018-03-07 DIAGNOSIS — Z89612 Acquired absence of left leg above knee: Secondary | ICD-10-CM

## 2018-03-07 DIAGNOSIS — Z7982 Long term (current) use of aspirin: Secondary | ICD-10-CM | POA: Diagnosis not present

## 2018-03-07 DIAGNOSIS — N400 Enlarged prostate without lower urinary tract symptoms: Secondary | ICD-10-CM | POA: Diagnosis present

## 2018-03-07 DIAGNOSIS — I9589 Other hypotension: Secondary | ICD-10-CM | POA: Diagnosis present

## 2018-03-07 DIAGNOSIS — E785 Hyperlipidemia, unspecified: Secondary | ICD-10-CM | POA: Diagnosis present

## 2018-03-07 DIAGNOSIS — Z89512 Acquired absence of left leg below knee: Secondary | ICD-10-CM

## 2018-03-07 DIAGNOSIS — T8189XA Other complications of procedures, not elsewhere classified, initial encounter: Secondary | ICD-10-CM

## 2018-03-07 DIAGNOSIS — F17210 Nicotine dependence, cigarettes, uncomplicated: Secondary | ICD-10-CM | POA: Diagnosis present

## 2018-03-07 DIAGNOSIS — Y835 Amputation of limb(s) as the cause of abnormal reaction of the patient, or of later complication, without mention of misadventure at the time of the procedure: Secondary | ICD-10-CM | POA: Diagnosis present

## 2018-03-07 DIAGNOSIS — Z8249 Family history of ischemic heart disease and other diseases of the circulatory system: Secondary | ICD-10-CM | POA: Diagnosis not present

## 2018-03-07 DIAGNOSIS — T8789 Other complications of amputation stump: Secondary | ICD-10-CM

## 2018-03-07 HISTORY — DX: Hypothyroidism, unspecified: E03.9

## 2018-03-07 HISTORY — DX: Acquired absence of left leg above knee: Z89.612

## 2018-03-07 HISTORY — PX: STUMP REVISION: SHX6102

## 2018-03-07 LAB — CBC
HEMATOCRIT: 30.4 % — AB (ref 39.0–52.0)
Hemoglobin: 9.6 g/dL — ABNORMAL LOW (ref 13.0–17.0)
MCH: 31.9 pg (ref 26.0–34.0)
MCHC: 31.6 g/dL (ref 30.0–36.0)
MCV: 101 fL — ABNORMAL HIGH (ref 78.0–100.0)
PLATELETS: 311 10*3/uL (ref 150–400)
RBC: 3.01 MIL/uL — ABNORMAL LOW (ref 4.22–5.81)
RDW: 14.4 % (ref 11.5–15.5)
WBC: 14 10*3/uL — AB (ref 4.0–10.5)

## 2018-03-07 LAB — COMPREHENSIVE METABOLIC PANEL
ALBUMIN: 2 g/dL — AB (ref 3.5–5.0)
ALT: 143 U/L — ABNORMAL HIGH (ref 0–44)
ANION GAP: 11 (ref 5–15)
AST: 73 U/L — AB (ref 15–41)
Alkaline Phosphatase: 81 U/L (ref 38–126)
BUN: 20 mg/dL (ref 6–20)
CHLORIDE: 102 mmol/L (ref 98–111)
CO2: 24 mmol/L (ref 22–32)
Calcium: 8.4 mg/dL — ABNORMAL LOW (ref 8.9–10.3)
Creatinine, Ser: 0.67 mg/dL (ref 0.61–1.24)
GFR calc Af Amer: >60 mL/min (ref >60)
GFR calc non Af Amer: >60 mL/min (ref >60)
GLUCOSE: 93 mg/dL (ref 70–99)
POTASSIUM: 4 mmol/L (ref 3.5–5.1)
SODIUM: 137 mmol/L (ref 135–145)
Total Bilirubin: 0.6 mg/dL (ref 0.3–1.2)
Total Protein: 6.3 g/dL — ABNORMAL LOW (ref 6.5–8.1)

## 2018-03-07 LAB — PROTIME-INR
INR: 1.2
Prothrombin Time: 15.1 seconds (ref 11.4–15.2)

## 2018-03-07 LAB — HEMOGLOBIN AND HEMATOCRIT, BLOOD
HCT: 24.1 % — ABNORMAL LOW (ref 39.0–52.0)
Hemoglobin: 7.8 g/dL — ABNORMAL LOW (ref 13.0–17.0)

## 2018-03-07 LAB — ABO/RH: ABO/RH(D): A POS

## 2018-03-07 LAB — PREPARE RBC (CROSSMATCH)

## 2018-03-07 SURGERY — REVISION, AMPUTATION SITE
Anesthesia: General | Laterality: Left

## 2018-03-07 MED ORDER — POVIDONE-IODINE 10 % EX SWAB: 2.0000 "application " | Freq: Once | CUTANEOUS | Status: DC

## 2018-03-07 MED ORDER — LEVOTHYROXINE SODIUM 100 MCG PO TABS
100.0000 ug | ORAL_TABLET | Freq: Every day | ORAL | Status: DC
  Administered 2018-03-08 – 2018-03-10 (×3): 100 ug via ORAL
  Filled 2018-03-07 (×3): qty 1

## 2018-03-07 MED ORDER — PROPOFOL 10 MG/ML IV BOLUS
INTRAVENOUS | Status: DC | PRN
  Administered 2018-03-07: 40 mg via INTRAVENOUS

## 2018-03-07 MED ORDER — SACUBITRIL-VALSARTAN 24-26 MG PO TABS
1.0000 | ORAL_TABLET | ORAL | Status: DC
  Administered 2018-03-07: 1 via ORAL
  Filled 2018-03-07 (×2): qty 1

## 2018-03-07 MED ORDER — OXYCODONE HCL 5 MG PO TABS
5.0000 mg | ORAL_TABLET | ORAL | Status: DC | PRN
  Filled 2018-03-07: qty 1

## 2018-03-07 MED ORDER — DULOXETINE HCL 30 MG PO CPEP
30.0000 mg | ORAL_CAPSULE | Freq: Every day | ORAL | Status: DC
  Administered 2018-03-07 – 2018-03-09 (×3): 30 mg via ORAL
  Filled 2018-03-07 (×3): qty 1

## 2018-03-07 MED ORDER — CLOPIDOGREL BISULFATE 75 MG PO TABS
75.0000 mg | ORAL_TABLET | Freq: Every day | ORAL | Status: DC
  Administered 2018-03-08 – 2018-03-10 (×3): 75 mg via ORAL
  Filled 2018-03-07 (×3): qty 1

## 2018-03-07 MED ORDER — SODIUM CHLORIDE 0.9 % IV SOLN
INTRAVENOUS | Status: DC
  Administered 2018-03-07: 20:00:00 via INTRAVENOUS

## 2018-03-07 MED ORDER — FUROSEMIDE 20 MG PO TABS
20.0000 mg | ORAL_TABLET | ORAL | Status: DC
  Administered 2018-03-07 – 2018-03-10 (×2): 20 mg via ORAL
  Filled 2018-03-07 (×2): qty 1

## 2018-03-07 MED ORDER — METHOCARBAMOL 1000 MG/10ML IJ SOLN
500.0000 mg | Freq: Four times a day (QID) | INTRAVENOUS | Status: DC | PRN
  Filled 2018-03-07: qty 5

## 2018-03-07 MED ORDER — CEFAZOLIN SODIUM-DEXTROSE 1-4 GM/50ML-% IV SOLN
1.0000 g | Freq: Four times a day (QID) | INTRAVENOUS | Status: AC
  Administered 2018-03-07 – 2018-03-08 (×3): 1 g via INTRAVENOUS
  Filled 2018-03-07 (×3): qty 50

## 2018-03-07 MED ORDER — ASPIRIN EC 81 MG PO TBEC
81.0000 mg | DELAYED_RELEASE_TABLET | Freq: Every day | ORAL | Status: DC
  Administered 2018-03-07 – 2018-03-10 (×4): 81 mg via ORAL
  Filled 2018-03-07 (×4): qty 1

## 2018-03-07 MED ORDER — FENTANYL CITRATE (PF) 250 MCG/5ML IJ SOLN
INTRAMUSCULAR | Status: AC
  Filled 2018-03-07: qty 5

## 2018-03-07 MED ORDER — PROPOFOL 10 MG/ML IV BOLUS: INTRAVENOUS | Status: DC | PRN

## 2018-03-07 MED ORDER — SODIUM CHLORIDE 0.9 % IV SOLN
INTRAVENOUS | Status: DC | PRN
  Administered 2018-03-07: 50 ug/min via INTRAVENOUS

## 2018-03-07 MED ORDER — ONDANSETRON HCL 4 MG PO TABS: 4.0000 mg | ORAL_TABLET | Freq: Four times a day (QID) | ORAL | Status: DC | PRN

## 2018-03-07 MED ORDER — METOCLOPRAMIDE HCL 5 MG PO TABS: 5.0000 mg | ORAL_TABLET | Freq: Three times a day (TID) | ORAL | Status: DC | PRN

## 2018-03-07 MED ORDER — CARVEDILOL 3.125 MG PO TABS
ORAL_TABLET | ORAL | Status: AC
  Filled 2018-03-07: qty 1

## 2018-03-07 MED ORDER — CARVEDILOL 3.125 MG PO TABS
3.1250 mg | ORAL_TABLET | Freq: Two times a day (BID) | ORAL | Status: DC
  Administered 2018-03-10: 3.125 mg via ORAL
  Filled 2018-03-07 (×5): qty 1

## 2018-03-07 MED ORDER — GABAPENTIN 300 MG PO CAPS
300.0000 mg | ORAL_CAPSULE | Freq: Three times a day (TID) | ORAL | Status: DC
  Administered 2018-03-07 – 2018-03-10 (×8): 300 mg via ORAL
  Filled 2018-03-07 (×8): qty 1

## 2018-03-07 MED ORDER — CHLORHEXIDINE GLUCONATE 4 % EX LIQD: 60.0000 mL | Freq: Once | CUTANEOUS | Status: DC

## 2018-03-07 MED ORDER — PROPOFOL 10 MG/ML IV BOLUS
INTRAVENOUS | Status: AC
  Filled 2018-03-07: qty 20

## 2018-03-07 MED ORDER — ONDANSETRON HCL 4 MG/2ML IJ SOLN: 4.0000 mg | Freq: Four times a day (QID) | INTRAMUSCULAR | Status: DC | PRN

## 2018-03-07 MED ORDER — MAGNESIUM CITRATE PO SOLN: 1.0000 | Freq: Once | ORAL | Status: DC | PRN

## 2018-03-07 MED ORDER — ONDANSETRON HCL 4 MG/2ML IJ SOLN
INTRAMUSCULAR | Status: DC | PRN
  Administered 2018-03-07: 4 mg via INTRAVENOUS

## 2018-03-07 MED ORDER — DOCUSATE SODIUM 100 MG PO CAPS
100.0000 mg | ORAL_CAPSULE | Freq: Two times a day (BID) | ORAL | Status: DC
  Administered 2018-03-07 – 2018-03-08 (×3): 100 mg via ORAL
  Filled 2018-03-07 (×6): qty 1

## 2018-03-07 MED ORDER — LACTATED RINGERS IV SOLN
INTRAVENOUS | Status: DC
  Administered 2018-03-07: 12:00:00 via INTRAVENOUS

## 2018-03-07 MED ORDER — SODIUM CHLORIDE 0.9 % IR SOLN
Status: DC | PRN
  Administered 2018-03-07: 1000 mL

## 2018-03-07 MED ORDER — CARVEDILOL 3.125 MG PO TABS
3.1250 mg | ORAL_TABLET | Freq: Once | ORAL | Status: AC
  Administered 2018-03-07: 3.125 mg via ORAL

## 2018-03-07 MED ORDER — FENTANYL CITRATE (PF) 100 MCG/2ML IJ SOLN
INTRAMUSCULAR | Status: DC | PRN
  Administered 2018-03-07 (×3): 50 ug via INTRAVENOUS

## 2018-03-07 MED ORDER — METOCLOPRAMIDE HCL 5 MG/ML IJ SOLN: 5.0000 mg | Freq: Three times a day (TID) | INTRAMUSCULAR | Status: DC | PRN

## 2018-03-07 MED ORDER — PHENYLEPHRINE HCL 10 MG/ML IJ SOLN
INTRAMUSCULAR | Status: DC | PRN
  Administered 2018-03-07 (×6): 80 ug via INTRAVENOUS

## 2018-03-07 MED ORDER — POLYETHYLENE GLYCOL 3350 17 G PO PACK: 17.0000 g | PACK | Freq: Every day | ORAL | Status: DC | PRN

## 2018-03-07 MED ORDER — OXYCODONE HCL 5 MG PO TABS: 10.0000 mg | ORAL_TABLET | ORAL | Status: DC | PRN

## 2018-03-07 MED ORDER — HYDROMORPHONE HCL 1 MG/ML IJ SOLN: 0.5000 mg | INTRAMUSCULAR | Status: DC | PRN

## 2018-03-07 MED ORDER — METHOCARBAMOL 500 MG PO TABS
500.0000 mg | ORAL_TABLET | Freq: Four times a day (QID) | ORAL | Status: DC | PRN
  Administered 2018-03-07 – 2018-03-08 (×2): 500 mg via ORAL
  Filled 2018-03-07 (×2): qty 1

## 2018-03-07 MED ORDER — SPIRONOLACTONE 25 MG PO TABS
25.0000 mg | ORAL_TABLET | Freq: Every day | ORAL | Status: DC
  Administered 2018-03-07 – 2018-03-10 (×4): 25 mg via ORAL
  Filled 2018-03-07 (×4): qty 1

## 2018-03-07 MED ORDER — CEFAZOLIN SODIUM-DEXTROSE 2-4 GM/100ML-% IV SOLN
2.0000 g | INTRAVENOUS | Status: AC
  Administered 2018-03-07: 2 g via INTRAVENOUS
  Filled 2018-03-07: qty 100

## 2018-03-07 MED ORDER — ACETAMINOPHEN 325 MG PO TABS
325.0000 mg | ORAL_TABLET | Freq: Four times a day (QID) | ORAL | Status: DC | PRN
  Administered 2018-03-08: 650 mg via ORAL
  Filled 2018-03-07: qty 2

## 2018-03-07 MED ORDER — SODIUM CHLORIDE 0.9% IV SOLUTION
Freq: Once | INTRAVENOUS | Status: AC
  Administered 2018-03-07: 22:00:00 via INTRAVENOUS

## 2018-03-07 MED ORDER — TAMSULOSIN HCL 0.4 MG PO CAPS
0.4000 mg | ORAL_CAPSULE | Freq: Every day | ORAL | Status: DC
  Administered 2018-03-08 – 2018-03-10 (×3): 0.4 mg via ORAL
  Filled 2018-03-07 (×3): qty 1

## 2018-03-07 MED ORDER — LACTATED RINGERS IV SOLN
INTRAVENOUS | Status: DC | PRN
  Administered 2018-03-07 (×2): via INTRAVENOUS

## 2018-03-07 MED ORDER — BISACODYL 10 MG RE SUPP: 10.0000 mg | Freq: Every day | RECTAL | Status: DC | PRN

## 2018-03-07 MED ORDER — ATORVASTATIN CALCIUM 40 MG PO TABS
40.0000 mg | ORAL_TABLET | Freq: Every day | ORAL | Status: DC
  Administered 2018-03-07 – 2018-03-10 (×4): 40 mg via ORAL
  Filled 2018-03-07 (×4): qty 1

## 2018-03-07 SURGICAL SUPPLY — 27 items
BLADE SURG 21 STRL SS (BLADE) ×3 IMPLANT
CANISTER WOUND CARE 500ML ATS (WOUND CARE) ×3 IMPLANT
COVER SURGICAL LIGHT HANDLE (MISCELLANEOUS) ×3 IMPLANT
DRAPE EXTREMITY T 121X128X90 (DRAPE) ×3 IMPLANT
DRAPE HALF SHEET 40X57 (DRAPES) ×3 IMPLANT
DRAPE INCISE IOBAN 66X45 STRL (DRAPES) ×3 IMPLANT
DRAPE U-SHAPE 47X51 STRL (DRAPES) ×6 IMPLANT
DRESSING PREVENA PLUS CUSTOM (GAUZE/BANDAGES/DRESSINGS) ×1 IMPLANT
DRSG PREVENA PLUS CUSTOM (GAUZE/BANDAGES/DRESSINGS) ×3
DURAPREP 26ML APPLICATOR (WOUND CARE) ×3 IMPLANT
ELECT REM PT RETURN 9FT ADLT (ELECTROSURGICAL) ×3
ELECTRODE REM PT RTRN 9FT ADLT (ELECTROSURGICAL) ×1 IMPLANT
GLOVE BIOGEL PI IND STRL 9 (GLOVE) ×1 IMPLANT
GLOVE BIOGEL PI INDICATOR 9 (GLOVE) ×2
GLOVE SURG ORTHO 9.0 STRL STRW (GLOVE) ×3 IMPLANT
GOWN STRL REUS W/ TWL XL LVL3 (GOWN DISPOSABLE) ×2 IMPLANT
GOWN STRL REUS W/TWL XL LVL3 (GOWN DISPOSABLE) ×4
KIT BASIN OR (CUSTOM PROCEDURE TRAY) ×3 IMPLANT
KIT TURNOVER KIT B (KITS) ×3 IMPLANT
NS IRRIG 1000ML POUR BTL (IV SOLUTION) ×3 IMPLANT
PACK GENERAL/GYN (CUSTOM PROCEDURE TRAY) ×3 IMPLANT
PAD ARMBOARD 7.5X6 YLW CONV (MISCELLANEOUS) ×3 IMPLANT
PREVENA RESTOR ARTHOFORM 46X30 (CANNISTER) ×3 IMPLANT
SUT ETHILON 2 0 PSLX (SUTURE) ×6 IMPLANT
SUT SILK 2 0 (SUTURE)
SUT SILK 2-0 18XBRD TIE 12 (SUTURE) IMPLANT
TOWEL OR 17X26 10 PK STRL BLUE (TOWEL DISPOSABLE) ×3 IMPLANT

## 2018-03-07 NOTE — Anesthesia Preprocedure Evaluation (Addendum)
Anesthesia Evaluation  Patient identified by MRN, date of birth, ID band Patient awake    Reviewed: Allergy & Precautions, NPO status , Patient's Chart, lab work & pertinent test results  Airway Mallampati: I  TM Distance: >3 FB Neck ROM: Full    Dental  (+) Poor Dentition,    Pulmonary Current Smoker,    breath sounds clear to auscultation       Cardiovascular + Peripheral Vascular Disease and +CHF   Rhythm:Regular Rate:Normal  Stress 11/2017 EF 17% Large area of nonreversible ischemia lateral wall, no reversible ischemia   Neuro/Psych negative neurological ROS  negative psych ROS   GI/Hepatic negative GI ROS, Neg liver ROS,   Endo/Other  Hypothyroidism   Renal/GU Renal disease  negative genitourinary   Musculoskeletal negative musculoskeletal ROS (+)   Abdominal   Peds  Hematology negative hematology ROS (+)   Anesthesia Other Findings 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 revision  Reproductive/Obstetrics                           Anesthesia Physical Anesthesia Plan  ASA: IV  Anesthesia Plan: General   Post-op Pain Management:    Induction:   PONV Risk Score and Plan: 1 and Treatment may vary due to age or medical condition, Dexamethasone and Ondansetron  Airway Management Planned: LMA  Additional Equipment:   Intra-op Plan:   Post-operative Plan: Extubation in OR  Informed Consent: I have reviewed the patients History and Physical, chart, labs and discussed the procedure including the risks, benefits and alternatives for the proposed anesthesia with the patient or authorized representative who has indicated his/her understanding and acceptance.   Dental advisory given  Plan Discussed with: CRNA  Anesthesia Plan Comments:         Anesthesia Quick Evaluation

## 2018-03-07 NOTE — Anesthesia Procedure Notes (Signed)
Procedure Name: LMA Insertion Date/Time: 03/07/2018 2:55 PM Performed by: Rudi RummageLowder, Daryl Beehler J, CRNA Pre-anesthesia Checklist: Patient identified, Emergency Drugs available, Suction available, Patient being monitored and Timeout performed Patient Re-evaluated:Patient Re-evaluated prior to induction Oxygen Delivery Method: Circle system utilized Preoxygenation: Pre-oxygenation with 100% oxygen Induction Type: IV induction Ventilation: Mask ventilation without difficulty LMA Size: 5.0 Number of attempts: 1 Placement Confirmation: positive ETCO2 and breath sounds checked- equal and bilateral Tube secured with: Tape Dental Injury: Teeth and Oropharynx as per pre-operative assessment

## 2018-03-07 NOTE — Consult Note (Signed)
[facility] Medical Consultation  Peter Cochran AOZ:[id number] DOB: [birth year] DOA: 03/07/2018 PCP: Floria RavelingWilson, Ashley H, FNP   Requesting physician: Dr. Lajoyce CornersUda Date of consultation: 03/07/2018 Reason for consultation: Management of other medical comorbidities  Impression/Recommendations ActiveAlger Cochran Problems:   BKA stump complication (HCC)   S/P AKA (above knee amputation) unilateral, left (HCC)    1. Gangrene left lower extremity s/p Left  AKA by Dr. Lajoyce Cornersuda today       - per orthopedics  2. Anemia: Hemoglobin presurgery noted to be 9.6.       -  Recheck hemoglobin and transfuse blood products if hemoglobin less than 8.  3. Systolic congestive heart failure: Last EF noted to be 30 to 35%.  Patient does not appear grossly fluid overloaded at this time.       -    Strict I&O's and daily weight       -  Continue Coreg, Lasix, Entresto, spironolactone as tolerated   4. Peripheral arterial disease -  Continue Plavix  5. Hypothyroidism - Continue Synthroid  6. Depression - Continue Cymbalta  7. Hyperlipidemia        - Continue atorvastatin  8. BPH       -  Continue Flomax  I will followup again tomorrow. Please contact me if I can be of assistance in the meanwhile. Thank you for this consultation.  Chief Complaint: Left leg wound  HPI:  Peter Cochran is a 62 y.o. male with medical history significant of systolic CHF 25- 96%30%, PAD, and hypothyroidism; who presented with worsening left BKA wound.  Patient was taken for revision to AKA.  Review of Systems  Constitutional: Negative for chills and fever.  Eyes: Negative for photophobia and pain.  Respiratory: Negative for cough and shortness of breath.   Cardiovascular: Negative for chest pain.  Gastrointestinal: Negative for abdominal pain, nausea and vomiting.  Genitourinary: Negative for hematuria and urgency.  Musculoskeletal: Positive for myalgias. Negative for joint pain.  Skin:       Positive for wound of left lower  extremity.      Past Medical History:  Diagnosis Date  . Cellulitis    mild.  L leg  . CHF (congestive heart failure) (HCC)    Dr. Norman HerrlichBrian Munley  . Complication of anesthesia    'hard time waking up"  . Difficulty voiding   . 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  . Enlarged prostate   . Hand fracture, right    Dr. Linda HedgesJeffrey Yaste  . Hypothyroidism (acquired)   . Leg ulcer, left (HCC)   . PAD (peripheral artery disease) (HCC)   . Periorbital hematoma, right   . Peripheral neuropathy   . Toe ulcer (HCC)    Left  . Wound of left leg    Non-healing. Dr. Liston AlbaPedro Hernandez, Dr. Bynum Bellowsitorya Strover.   Past Surgical History:  Procedure Laterality Date  . AMPUTATION Left 02/28/2018   Procedure: LEFT BELOW KNEE AMPUTATION;  Surgeon: Nadara Mustarduda, Marcus V, MD;  [facility];  Service: Orthopedics;  Laterality: Left;  . AORTOGRAM Left 12/12/2017   Procedure: AORTOGRAM LEFT LOWER EXTREMITY RUNOFF;  Surgeon: Fransisco Hertzhen, Brian L, MD;  [facility];  Service: Vascular;  Laterality: Left;  . BELOW KNEE LEG AMPUTATION Left 02/28/2018  . CYST EXCISION    . PERIPHERAL VASCULAR BALLOON ANGIOPLASTY Left 12/12/2017   Procedure: PERIPHERAL VASCULAR BALLOON ANGIOPLASTY LEFT SFA;  Surgeon: Fransisco Hertzhen, Brian L, MD;  [facility];  Service: Vascular;  Laterality:  Left;  . PILONIDAL CYST DRAINAGE     Social History:  reports that he has been smoking cigarettes. He has a 25.00 pack-year smoking history. He has never used smokeless tobacco. He reports that he drank alcohol. He reports that he has current or past drug history.  Allergies  Allergen Reactions  . Oxytetracycline Itching, Swelling and Other (See Comments)    Facial swelling  . Sulfa Antibiotics Rash   Family History  Problem Relation Age of Onset  . Hypertension Mother   . Hyperlipidemia Mother   . Heart attack Father   . Diabetes Father   . Hypertension Father   . Hyperlipidemia Father   . Stroke Maternal Grandmother     Prior to Admission  medications   Medication Sig Start Date End Date Taking? Authorizing Provider  amoxicillin-clavulanate (AUGMENTIN) 875-125 MG tablet Take 1 tablet by mouth 2 (two) times daily.   Yes [provider]  aspirin EC 81 MG tablet Take 81 mg by mouth daily.   Yes [provider]  atorvastatin (LIPITOR) 40 MG tablet Take 1 tablet (40 mg total) by mouth daily. 11/20/17  Yes Baldo Daub, MD  carvedilol (COREG) 3.125 MG tablet Take 1 tablet (3.125 mg total) by mouth 2 (two) times daily with a meal. 11/20/17  Yes Baldo Daub, MD  clopidogrel (PLAVIX) 75 MG tablet Take 1 tablet (75 mg total) by mouth daily with breakfast. 12/15/17  Yes Albertine Grates, MD  DULoxetine (CYMBALTA) 30 MG capsule Take 30 mg by mouth at bedtime.  10/23/17  Yes [provider]  furosemide (LASIX) 20 MG tablet Take 1 tablet (20 mg total) by mouth 3 (three) times a week. On Monday Wednesday friday Patient taking differently: Take 20 mg by mouth every Monday, Wednesday, and Friday.  12/25/17 03/25/18 Yes Baldo Daub, MD  levothyroxine (SYNTHROID, LEVOTHROID) 100 MCG tablet Take 100 mcg by mouth daily before breakfast.    Yes [provider]  Multiple Vitamin (MULTIVITAMIN WITH MINERALS) TABS tablet Take 1 tablet by mouth daily. 12/15/17  Yes Albertine Grates, MD  sacubitril-valsartan (ENTRESTO) 24-26 MG Take 1 tablet by mouth 3 (three) times a week. 11/13/17  Yes Baldo Daub, MD  spironolactone (ALDACTONE) 25 MG tablet Take 1 tablet (25 mg total) by mouth daily. 10/28/17  Yes Baldo Daub, MD  tamsulosin (FLOMAX) 0.4 MG CAPS capsule Take 1 capsule (0.4 mg total) by mouth daily after breakfast. 12/15/17  Yes Albertine Grates, MD  traMADol (ULTRAM) 50 MG tablet Take 100 mg by mouth every 6 (six) hours as needed for moderate pain.   Yes [provider]  oxyCODONE-acetaminophen (PERCOCET/ROXICET) 5-325 MG tablet Take 1 tablet by mouth every 4 (four) hours as needed. Patient not taking: Reported on 03/07/2018 03/04/18    Nadara Mustard, MD   Physical Exam:  Constitutional: NAD, calm, comfortable Vitals:   03/07/18 1026 03/07/18 1547 03/07/18 1604  BP: 106/74 (!) 97/58 99/61  Pulse: 98 86 85  Resp: 20 18 18   Temp: 98 F (36.7 C) 97.9 F (36.6 C)   TempSrc: Oral    SpO2: 100% 100% 99%  Weight: 93.4 kg    Height: 5\' 11"  (1.803 m)     Eyes: PERRL, lids and conjunctivae normal ENMT: Mucous membranes are moist. Posterior pharynx clear of any exudate or lesions. Poor dentition Neck: normal, supple, no masses, no thyromegaly Respiratory: clear to auscultation bilaterally, no wheezing, no crackles. Normal respiratory effort. No accessory muscle use.  Cardiovascular: Regular  rate and rhythm, no murmurs / rubs / gallops. No extremity edema. 2+ pedal pulses. No carotid bruits.  Abdomen: no tenderness, no masses palpated. No hepatosplenomegaly. Bowel sounds positive.  Musculoskeletal: no clubbing / cyanosis. No joint deformity upper and lower extremities. Good ROM, no contractures. Normal muscle tone.  Skin: Right lower extremity with some mild erythema Neurologic: CN 2-12 grossly intact. Sensation intact, DTR normal. Strength 5/5 in all 4.  Psychiatric: Normal judgment and insight. Alert and oriented x 3. Normal mood.   Labs on Admission:  Basic Metabolic Panel: Recent Labs  Lab 03/07/18 1105  NA 137  K 4.0  CL 102  CO2 24  GLUCOSE 93  BUN 20  CREATININE 0.67  CALCIUM 8.4*   Liver Function Tests: Recent Labs  Lab 03/07/18 1105  AST 73*  ALT 143*  ALKPHOS 81  BILITOT 0.6  PROT 6.3*  ALBUMIN 2.0*   No results for input(s): LIPASE, AMYLASE in the last 168 hours. No results for input(s): AMMONIA in the last 168 hours. CBC: Recent Labs  Lab 03/07/18 1105  WBC 14.0*  HGB 9.6*  HCT 30.4*  MCV 101.0*  PLT 311   Cardiac Enzymes: No results for input(s): CKTOTAL, CKMB, CKMBINDEX, TROPONINI in the last 168 hours. BNP: Invalid input(s): POCBNP CBG: No results for input(s): GLUCAP in the  last 168 hours.  Radiological Exams on Admission: No results found.    Time spent: >45 minutes  Clydie Braun [facility] Pager [phone]  If 7PM-7AM, please contact night-coverage [email or web address] Password [facility] 03/07/2018, 4:52 PM

## 2018-03-07 NOTE — Anesthesia Postprocedure Evaluation (Signed)
Anesthesia Post Note  Patient: Alger SimonsRobert T Mckeone  Procedure(s) Performed: REVISION LEFT BELOW KNEE AMPUTATION VS. LEFT ABOVE KNEE AMPUTATION (Left )     Patient location during evaluation: PACU Anesthesia Type: General Level of consciousness: awake and alert Pain management: pain level controlled Vital Signs Assessment: post-procedure vital signs reviewed and stable Respiratory status: spontaneous breathing, nonlabored ventilation, respiratory function stable and patient connected to nasal cannula oxygen Cardiovascular status: blood pressure returned to baseline and stable Postop Assessment: no apparent nausea or vomiting Anesthetic complications: no    Last Vitals:  Vitals:   03/07/18 1747 03/07/18 2041  BP: 94/62 (!) 94/54  Pulse: 83 93  Resp: 18 20  Temp:  36.7 C  SpO2: 96% 96%    Last Pain:  Vitals:   03/07/18 2041  TempSrc: Oral  PainSc:    Pain Goal:                 Beryle Lathehomas E Brock

## 2018-03-07 NOTE — Progress Notes (Signed)
Office Visit Note   Patient: Peter Cochran           Date of Birth: 1955-08-17           MRN: [id number] Visit Date: 03/07/2018              Requested by: Floria Raveling, FNP [address] [place], [place] [address] PCP: Floria Raveling, FNP   Assessment & Plan: Visit Diagnoses: No diagnosis found.  Plan: To OR for irrigation and debridement, likely revision to left above the knee amputation.  Consult hospitalist service to assist with care.    Follow-Up Instructions: No follow-ups on file.   Orders:  No orders of the defined types were placed in this encounter.  No orders of the defined types were placed in this encounter.     Procedures: No procedures performed   Clinical Data: No additional findings.   Subjective: No chief complaint on file.   HPI Patient is a 72 you male who is status post left BKA 02/28/2018. Patient was discharged to SNF with [facility] on 03/04/18 but reports he left the facility the following day. He reports the [facility] has not been working since 03/04/18 and he has left the dressing in place.  He denies fever or chills. Reports drainage from the left BKA and has been taping a diaper to the stump protector and using a garbage bag over the area to keep it from draining on the bedding.  He is brought in by his family today.  Review of Systems   Objective: Vital Signs: There were no vitals taken for this visit.  Physical Exam Elderly male presents in wheelchair. Denies fever or chills.   Ortho Exam Left BKA with dehiscence of the incision and necrotic skin, fat and muscle visible in the wound bed. Odor and purulent appearing drainage.  Specialty Comments:  No specialty comments available.  Imaging: No results found.   PMFS History: Patient Active Problem List   Diagnosis Date Noted  . Hx of BKA, left (HCC) 02/28/2018  . Antibiotic long-term use   . AKI (acute kidney injury) (HCC)   . Severe protein-calorie malnutrition (HCC)  12/12/2017  . Bilateral lower leg cellulitis   . Gangrene of toe of left foot (HCC)   . Pressure injury of skin 12/07/2017  . Sepsis due to cellulitis (HCC) 12/06/2017  . Chronic systolic heart failure (HCC) 10/28/2017  . Dilated cardiomyopathy (HCC) 10/27/2017  . Mitral regurgitation 10/27/2017  . Elevated troponin 10/27/2017  . Elevated brain natriuretic peptide (BNP) level 10/27/2017  . PAD (peripheral artery disease) (HCC) 10/27/2017  . 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 Fatigue 10/22/2017   Past Medical History:  Diagnosis Date  . Cellulitis    mild.  L leg  . CHF (congestive heart failure) (HCC)    Dr. Norman Herrlich  . Complication of anesthesia    'hard time waking up"  . Difficulty voiding   . 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  . Enlarged prostate   . Hand fracture, right    Dr. Linda Hedges  . Hypothyroidism (acquired)   . Leg ulcer, left (HCC)   . PAD (peripheral artery disease) (HCC)   . Periorbital hematoma, right   . Peripheral neuropathy   . Toe ulcer (HCC)    Left  . Wound of left leg    Non-healing. Dr. Liston Alba, Dr. Bynum Bellows.  Family History  Problem Relation Age of Onset  . Hypertension Mother   . Hyperlipidemia Mother   . Heart attack Father   . Diabetes Father   . Hypertension Father   . Hyperlipidemia Father   . Stroke Maternal Grandmother     Past Surgical History:  Procedure Laterality Date  . AMPUTATION Left 02/28/2018   Procedure: LEFT BELOW KNEE AMPUTATION;  Surgeon: Nadara Mustarduda, Marcus V, MD;  [facility];  Service: Orthopedics;  Laterality: Left;  . AORTOGRAM Left 12/12/2017   Procedure: AORTOGRAM LEFT LOWER EXTREMITY RUNOFF;  Surgeon: Fransisco Hertzhen, Brian L, MD;  [facility];  Service: Vascular;  Laterality: Left;  . BELOW KNEE LEG AMPUTATION Left 02/28/2018  . CYST EXCISION    . PERIPHERAL VASCULAR BALLOON ANGIOPLASTY Left 12/12/2017   Procedure: PERIPHERAL VASCULAR BALLOON  ANGIOPLASTY LEFT SFA;  Surgeon: Fransisco Hertzhen, Brian L, MD;  [facility];  Service: Vascular;  Laterality: Left;  . PILONIDAL CYST DRAINAGE     Social History   Occupational History  . Not on file  Tobacco Use  . Smoking status: Current Every Day Smoker    Packs/day: 0.50    Years: 50.00    Pack years: 25.00    Types: Cigarettes  . Smokeless tobacco: Never Used  . Tobacco comment: Less than a pack , "trying to quit"  Substance and Sexual Activity  . Alcohol use: Not Currently  . Drug use: Not Currently  . Sexual activity: Not on file

## 2018-03-07 NOTE — Plan of Care (Signed)

## 2018-03-07 NOTE — H&P (Incomplete)
Alger SimonsRobert T Garate is an 62 y.o. male.   Chief Complaint: *** HPI: ***  Past Medical History:  Diagnosis Date  . Cellulitis    mild.  L leg  . CHF (congestive heart failure) (HCC)    Dr. Norman HerrlichBrian Munley  . Complication of anesthesia    'hard time waking up"  . Difficulty voiding   . 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  . Enlarged prostate   . Hand fracture, right    Dr. Linda HedgesJeffrey Yaste  . Hypothyroidism (acquired)   . Leg ulcer, left (HCC)   . PAD (peripheral artery disease) (HCC)   . Periorbital hematoma, right   . Peripheral neuropathy   . Toe ulcer (HCC)    Left  . Wound of left leg    Non-healing. Dr. Liston AlbaPedro Hernandez, Dr. Bynum Bellowsitorya Strover.    Past Surgical History:  Procedure Laterality Date  . AMPUTATION Left 02/28/2018   Procedure: LEFT BELOW KNEE AMPUTATION;  Surgeon: Nadara Mustarduda, Marcus V, MD;  [facility];  Service: Orthopedics;  Laterality: Left;  . AORTOGRAM Left 12/12/2017   Procedure: AORTOGRAM LEFT LOWER EXTREMITY RUNOFF;  Surgeon: Fransisco Hertzhen, Brian L, MD;  [facility];  Service: Vascular;  Laterality: Left;  . BELOW KNEE LEG AMPUTATION Left 02/28/2018  . CYST EXCISION    . PERIPHERAL VASCULAR BALLOON ANGIOPLASTY Left 12/12/2017   Procedure: PERIPHERAL VASCULAR BALLOON ANGIOPLASTY LEFT SFA;  Surgeon: Fransisco Hertzhen, Brian L, MD;  [facility];  Service: Vascular;  Laterality: Left;  . PILONIDAL CYST DRAINAGE      Family History  Problem Relation Age of Onset  . Hypertension Mother   . Hyperlipidemia Mother   . Heart attack Father   . Diabetes Father   . Hypertension Father   . Hyperlipidemia Father   . Stroke Maternal Grandmother    Social History:  reports that he has been smoking cigarettes. He has a 25.00 pack-year smoking history. He has never used smokeless tobacco. He reports that he drank alcohol. He reports that he has current or past drug history.  Allergies:  Allergies  Allergen Reactions  . Oxytetracycline Itching, Swelling and Other (See Comments)   Facial swelling  . Sulfa Antibiotics Rash     (Not in a hospital admission)  No results found for this or any previous visit (from the past 48 hour(s)). No results found.  ROS  There were no vitals taken for this visit. Physical Exam     Assessment/Plan ***  Trayden Brandy, PA-C 03/07/2018, 9:47 AM

## 2018-03-07 NOTE — Op Note (Addendum)
03/07/2018  3:39 PM  PATIENT:  Peter Cochran    PRE-OPERATIVE DIAGNOSIS:  Dehiscence Left Below Knee Amputation  POST-OPERATIVE DIAGNOSIS:  Same  PROCEDURE:  LEFT ABOVE KNEE AMPUTATION  SURGEON:  Nadara MustardMarcus V Quinnten Calvin, MD  PHYSICIAN ASSISTANT: Lazaro ArmsShawn Rayburn, PA-C ANESTHESIA:   General  PREOPERATIVE INDICATIONS:  Peter Cochran is a  62 y.o. male with a diagnosis of Dehiscence Left Below Knee Amputation who failed conservative measures and elected for surgical management.    The risks benefits and alternatives were discussed with the patient preoperatively including but not limited to the risks of infection, bleeding, nerve injury, cardiopulmonary complications, the need for revision surgery, among others, and the patient was willing to proceed.  OPERATIVE IMPLANTS: Praveena customizable and restore incisional VAC  @ENCIMAGES @  OPERATIVE FINDINGS: Muscle is viable petechial bleeding.  OPERATIVE PROCEDURE: Patient was brought the operating room and underwent a general anesthetic.  After adequate levels of anesthesia were obtained patient's left lower extremity was prepped using DuraPrep draped into a sterile field the necrotic below the knee amputation was draped out of the sterile field with impervious stockinette and Covan.  A timeout was called patient received 2 g of Kefzol preoperatively.  A fishmouth incision was made proximal to the patella.  This was carried down to bone a reciprocating saw was used to amputate the limb through the femur.  The vascular bundle was clamped and suture-ligated medially.  The sciatic nerve was pulled cut and allowed to retract.  The soft tissue envelope was then completed with the amputation.  Electrocautery was used for further hemostasis.  The wound was irrigated normal saline the deep superficial fascia layers and skin was closed using 2-0 nylon.  A Praveena customizable foam was then placed midline dorsal to posteriorly and a restore dressing was then applied  over top of this.  This was covered with [place] this had a good suction fit patient was extubated taken the PACU in stable condition.  Shawn Rayburn was present throughout the case, she was necessary for positioning, prepping, surgical assistance, and  closure.   DISCHARGE PLANNING:  Antibiotic duration: Continue IV antibiotics for 24 hours  Weightbearing: Nonweightbearing on the left  Pain medication: High-dose opioid pathway  Dressing care/ Wound VAC: Continue wound VAC for 1 week  Ambulatory devices: Walker  Discharge to: Skilled nursing facility  Follow-up: In the office 1 week post operative.

## 2018-03-07 NOTE — Anesthesia Procedure Notes (Signed)
Arterial Line Insertion Start/End8/23/2019 1:01 PM, 03/07/2018 1:16 PM Performed by: Rachel MouldsLee, Johnella Crumm B, CRNA, CRNA  Patient location: Pre-op. Preanesthetic checklist: patient identified, IV checked, site marked, risks and benefits discussed, surgical consent, monitors and equipment checked, pre-op evaluation and timeout performed Lidocaine 1% used for infiltration Right, radial was placed Catheter size: 20 G Hand hygiene performed  and maximum sterile barriers used  Allen's test indicative of satisfactory collateral circulation Attempts: 1 Procedure performed without using ultrasound guided technique. Following insertion, Biopatch and dressing applied. Post procedure assessment: normal  Patient tolerated the procedure well with no immediate complications.

## 2018-03-07 NOTE — Progress Notes (Signed)
Wife states that pt. Checked himself out of nursing home on Wednesday,has not taken any of his medications since then.  Call Dr. Armond HangWoodrum about medications,states she will review chart  And see if there any medications he needs before surgery,and let us know.

## 2018-03-07 NOTE — Transfer of Care (Signed)
Immediate Anesthesia Transfer of Care Note  Patient: Peter SimonsRobert T Cochran  Procedure(s) Performed: REVISION LEFT BELOW KNEE AMPUTATION VS. LEFT ABOVE KNEE AMPUTATION (Left )  Patient Location: PACU  Anesthesia Type:General  Level of Consciousness: awake  Airway & Oxygen Therapy: Patient Spontanous Breathing  Post-op Assessment: Report given to RN and Post -op Vital signs reviewed and stable  Post vital signs: Reviewed and stable  Last Vitals:  Vitals Value Taken Time  BP 99/61 03/07/2018  4:04 PM  Temp    Pulse 82 03/07/2018  4:05 PM  Resp 23 03/07/2018  4:05 PM  SpO2 98 % 03/07/2018  4:05 PM  Vitals shown include unvalidated device data.  Last Pain:  Vitals:   03/07/18 1107  TempSrc:   PainSc: 4          Complications: No apparent anesthesia complications

## 2018-03-08 ENCOUNTER — Encounter (HOSPITAL_COMMUNITY): Payer: Self-pay

## 2018-03-08 ENCOUNTER — Other Ambulatory Visit: Payer: Self-pay

## 2018-03-08 DIAGNOSIS — Z89612 Acquired absence of left leg above knee: Secondary | ICD-10-CM

## 2018-03-08 DIAGNOSIS — D649 Anemia, unspecified: Secondary | ICD-10-CM

## 2018-03-08 DIAGNOSIS — D72829 Elevated white blood cell count, unspecified: Secondary | ICD-10-CM | POA: Diagnosis present

## 2018-03-08 DIAGNOSIS — I9589 Other hypotension: Secondary | ICD-10-CM

## 2018-03-08 DIAGNOSIS — I739 Peripheral vascular disease, unspecified: Secondary | ICD-10-CM

## 2018-03-08 DIAGNOSIS — E039 Hypothyroidism, unspecified: Secondary | ICD-10-CM

## 2018-03-08 DIAGNOSIS — N5089 Other specified disorders of the male genital organs: Secondary | ICD-10-CM

## 2018-03-08 DIAGNOSIS — I5022 Chronic systolic (congestive) heart failure: Secondary | ICD-10-CM

## 2018-03-08 HISTORY — DX: Other hypotension: I95.89

## 2018-03-08 HISTORY — DX: Other specified disorders of the male genital organs: N50.89

## 2018-03-08 HISTORY — DX: Elevated white blood cell count, unspecified: D72.829

## 2018-03-08 HISTORY — DX: Anemia, unspecified: D64.9

## 2018-03-08 LAB — BPAM RBC
BLOOD PRODUCT EXPIRATION DATE: 201909152359
ISSUE DATE / TIME: 201908232220
UNIT TYPE AND RH: 6200

## 2018-03-08 LAB — CBC
HEMATOCRIT: 27.6 % — AB (ref 39.0–52.0)
HEMOGLOBIN: 9 g/dL — AB (ref 13.0–17.0)
MCH: 31.1 pg (ref 26.0–34.0)
MCHC: 32.6 g/dL (ref 30.0–36.0)
MCV: 95.5 fL (ref 78.0–100.0)
Platelets: 325 10*3/uL (ref 150–400)
RBC: 2.89 MIL/uL — ABNORMAL LOW (ref 4.22–5.81)
RDW: 14.6 % (ref 11.5–15.5)
WBC: 13 10*3/uL — ABNORMAL HIGH (ref 4.0–10.5)

## 2018-03-08 LAB — TYPE AND SCREEN
ABO/RH(D): A POS
ANTIBODY SCREEN: NEGATIVE
UNIT DIVISION: 0

## 2018-03-08 NOTE — Plan of Care (Signed)
  Problem: Activity: Goal: Ability to perform//tolerate increased activity and mobilize with assistive devices will improve Outcome: Progressing   Problem: Clinical Measurements: Goal: Postoperative complications will be avoided or minimized Outcome: Progressing   Problem: Self-Care: Goal: Ability to meet self-care needs will improve Outcome: Progressing   Problem: Self-Concept: Goal: Ability to maintain and perform role responsibilities to the fullest extent possible will improve Outcome: Progressing   Problem: Pain Management: Goal: Pain level will decrease with appropriate interventions Outcome: Progressing

## 2018-03-08 NOTE — Evaluation (Signed)
Physical Therapy Evaluation Patient Details Name: Peter Cochran Peter Cochran MRN: [id number] DOB: 01-Nov-1955 Today's Date: 03/08/2018   History of Present Illness  Peter Cochran Arganbright is an 62 y.o. 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 Reportedly patient left the SNF after a day and his VAC had not been working since 03/04/18. He presented with increased drainage from his BKA. He was admitted after being seen in office on 8/23 with plan to revise to left aka.   Clinical Impression  Pt now with AKA on left.  His right side is weak.  Pt with poor mobilty.  He will need +2 help with all mobility and benefit from daily therapy if he is going to get his mobility back.  He needs to have consistent nursing assist to help with wound healing and protecting right leg (he has to use it more and more which increases shearing forces on it if not careful).  Pt needs to go SNF for best outcome. He says he will refuse and wants to go home. He understands my reasoning for SNF.  If he goes home pt may need hoyer lift for safety.  I did not get to talk to wife and see if she feels she could care for him at home in his present condition.    Follow Up Recommendations SNF;Supervision/Assistance - 24 hour    Equipment Recommendations  None recommended by PT    Recommendations for Other Services       Precautions / Restrictions Precautions Precautions: Fall Precaution Comments: left AKA - VAC intact.  Swollen Scrotum = catheter intact Restrictions Weight Bearing Restrictions: Yes LLE Weight Bearing: Non weight bearing Other Position/Activity Restrictions: pts right leg with poor circulation and ulcers - needs to protect this leg      Mobility  Bed Mobility Overal bed mobility: Needs Assistance Bed Mobility: Supine to Sit;Sit to Supine      Supine to sit: Max assist Sit to supine: Mod assist   General bed mobility comments: pt had hard time getting into sitting - couldnt get wegiht onto arms to push up- i let him pull on me and he still needed a lot of help to get into sitting.  pt needed help to get leg back into bed to lay down and with bed in trendelenburg - helped him scoot up in bed.  Transfers Overall transfer level: Needs assistance               General transfer comment: pt would need +2 to get OOB.  today i had him work on scooting up in bed.   he worked for 10-15 minutes on scooting up - using his arms and shifting his legs and body to get up in bed - good exercises for him.  got a clean pad under him and kept pad moving wit him to reduce any friction on buttocks.  pt said he does a scoot with his wife or pulls up on something to get up.  he is wanting to get sliding board to help transfer  Ambulation/Gait                Stairs            Wheelchair Mobility    Modified Rankin (Stroke Patients Only)       Balance  Sitting balance - Comments: pt occasionally would lean posterior but could correct.  pt encouraged to sit more erect.                                     Pertinent Vitals/Pain Pain Assessment: 0-10 Pain Score: 5 (pt thinking pain is good -he has good nerves and sensation) Pain Location: left residual limb Pain Descriptors / Indicators: Aching;Discomfort Pain Intervention(s): Limited activity within patient's tolerance;Monitored during session;Repositioned    Home Living Family/patient expects to be discharged to:: Skilled nursing facility               Home Equipment: Dan Humphreys - 2 wheels;Wheelchair - manual;Bedside commode Additional Comments: After BKA pt went to SNF and said they never came when he called - always needed +2 and never came back.  He went home and h e says it went well (But VAC didnt work).  Pt has had HH for a while - not moving  well since April. He says wife can handle him - he pulls up on kitchen sink and wife puts [facility] under him and then cleans him up - will be harder now with AKA.  pt says wife works 2 twelve hour days and then 3 days off.  nephew takes care of him while wife works.    Prior Function Level of Independence: Needs assistance   Gait / Transfers Assistance Needed: Assistance required to transfer to and from w/c. Sits in recliner majority of the day. States he was working on "walking" with HHPT.   ADL's / Homemaking Assistance Needed: Max assist for ADL. Has a foley for urination. Occasionally transfers to [facility] for bowel movement - by pulling on kitchen sink  Comments: has fallen x 2 in past month. Once out of bed and once when trying to stand at sink and feet slipped from underneath him.     Hand Dominance        Extremity/Trunk Assessment   Upper Extremity Assessment Upper Extremity Assessment: (right arm weaker than left - cant straighten right arm - approx 40 degree elbow contrature.)    Lower Extremity Assessment Lower Extremity Assessment: (right leg was his weak leg prior to AKA.  grossly 3-/5)    Cervical / Trunk Assessment Cervical / Trunk Assessment: Kyphotic(pt said whole right side weaker - MD awre and they have done tests but dont know why)  Communication   Communication: No difficulties  Cognition Arousal/Alertness: Awake/alert Behavior During Therapy: WFL for tasks assessed/performed Overall Cognitive Status: Within Functional Limits for tasks assessed                                 General Comments: Somewhat decreased insight into deficits      General Comments General comments (skin integrity, edema, etc.): pt educated on safe positioning on each leg to protect skin    Exercises     Assessment/Plan    PT Assessment Patient needs continued PT services  PT Problem List Decreased strength;Decreased range of motion;Decreased activity tolerance;Decreased  balance;Decreased mobility;Pain;Decreased knowledge of precautions;Decreased knowledge of use of DME;Impaired sensation;Decreased skin integrity       PT Treatment Interventions DME instruction;Functional mobility training;Therapeutic activities;Therapeutic exercise;Balance training;Patient/family education;Wheelchair mobility training    PT Goals (Current goals can be found in the Care Plan section)  Acute Rehab PT Goals Patient Stated Goal: "go  home" PT Goal Formulation: With patient Time For Goal Achievement: 03/15/18 Potential to Achieve Goals: Poor    Frequency Min 2X/week   Barriers to discharge   needs +2 assist for mobilty and needs therapy every day and needs nursing to help with wound healing    Co-evaluation               AM-PAC PT "6 Clicks" Daily Activity  Outcome Measure Difficulty turning over in bed (including adjusting bedclothes, sheets and blankets)?: Unable Difficulty moving from lying on back to sitting on the side of the bed? : Unable Difficulty sitting down on and standing up from a chair with arms (e.g., wheelchair, bedside commode, etc,.)?: Unable Help needed moving to and from a bed to chair (including a wheelchair)?: Total Help needed walking in hospital room?: Total Help needed climbing 3-5 steps with a railing? : Total 6 Click Score: 6    End of Session   Activity Tolerance: Patient tolerated treatment well;Patient limited by fatigue Patient left: in bed;with call bell/phone within reach Nurse Communication: Mobility status;Need for lift equipment PT Visit Diagnosis: Other abnormalities of gait and mobility (R26.89);Muscle weakness (generalized) (M62.81)    Time: 1610-1700 PT Time Calculation (min) (ACUTE ONLY): 50 min   Charges:   PT Evaluation $PT Eval Moderate Complexity: 1 Mod PT Treatments $Therapeutic Activity: 23-37 mins        03/08/2018   Ranae Palms, PT   Judson Roch 03/08/2018, 5:27 PM

## 2018-03-08 NOTE — Progress Notes (Signed)
Patient ID: Peter SimonsRobert T Reitz, male   DOB: 16-Apr-1956, 62 y.o.   MRN: [id number] Awake and alert this am.  Appears comfortable.  H/H stable.  Slightly hypotensive, but asymptomatic.  Not much pain.  Left AKA stump dressing with incisional VAC.  The plan is to keep him here thru the weekend.

## 2018-03-08 NOTE — Progress Notes (Signed)
Transfused 1 unit PRBC, no adverse reactions/side effects noted, Pt tolerated well, vital signs taken and recorded, post blood transfusion CBC order placed.

## 2018-03-08 NOTE — Plan of Care (Signed)
  Problem: Education: Goal: Knowledge of General Education information will improve Description: Including pain rating scale, medication(s)/side effects and non-pharmacologic comfort measures Outcome: Progressing   Problem: Health Behavior/Discharge Planning: Goal: Ability to manage health-related needs will improve Outcome: Progressing   Problem: Clinical Measurements: Goal: Ability to maintain clinical measurements within normal limits will improve Outcome: Progressing Goal: Will remain free from infection Outcome: Progressing Goal: Diagnostic test results will improve Outcome: Progressing   Problem: Safety: Goal: Ability to remain free from injury will improve Outcome: Progressing   Problem: Skin Integrity: Goal: Risk for impaired skin integrity will decrease Outcome: Progressing   

## 2018-03-08 NOTE — Progress Notes (Signed)
Medical Consultation-Follow up note   Peter Cochran  ZOX:[id number]  DOB: 01-11-1956  DOA: 03/07/2018  PCP: Floria RavelingWilson, Ashley H, FNP   Requesting physician: Dr. Lajoyce CornersUda Date of consultation: 03/07/2018 Reason for consultation: Management of other medical comorbidities   Impression/Recommendations Principal Problem:   BKA stump complication (HCC) Active Problems:   PAD (peripheral artery disease) (HCC)   Chronic systolic heart failure (HCC)   S/P AKA (above knee amputation) unilateral, left (HCC)   Hypothyroidism  Left BKA dehiscence status post left AKA on 8/23, stable.  Per primary  Chronic Hypotension.  Does have soft BPs (systolics 90s-100s) at baseline per patient and confirmed on chart review ( recent office visits).  Blood pressure in the last 24 hours 81/65-94/60.  Remains asymptomatic.  Will be hesitant for fluid bolus given reduced EF.  Monitor  Acute on chronic anemia, stable.  Baseline hemoglobin 10-12.  On admission hemoglobin 9.6 with a drop to 7.8 shortly after procedure.  Suspect acute blood loss anemia, rebounded to 9.0 on 8/24 without transfusion intervention.  Leukocytosis, suspect stress related to recent surgery.  No localizing signs of infection other than wound dehiscence.  Remains afebrile, monitor.  Systolic CHF (EF 81-19%25-30%, 10/2017).  Euvolemic, daily weights, strict I's and O's.  Coreg, Lasix, Entresto, spironolactone per home regimen  Significant scrotal edema, chronic. Reports present for 20+ years. He uses a chronic indwelling foley catheter. Monitor  PAD, stable-on Plavix  Hypothyroidism, stable-home Synthroid  Hyperlipidemia, stable- Home atorvastatin  BPH, stable-on Flomax   Thank you for this consultation.  Our [facility] hospitalist team will follow the patient with you.     Laverna PeaceShayla D Nettey M.D. [facility] [email or web address] Password [email or web address]  03/08/2018, 10:56 AM   History of Present Illness: Peter SimonsRobert T Cochran is an 62 y.o. male  med hx significant for PAD s/p angioplasty and stenting to left leg,PVD,CAD, HLD, smoking hospitalized for gangrenous foot in 11/2017 and underwent  L BKA on 02/28/18 and discharged with Prevena VAC on 03/04/18 to SNF. Reportedly patient left the SNF after a day and his VAC had not been working since 03/04/18. He presented with increased drainage from his BKA. He was admitted after being seen in office on 8/23 with plan to revise left aka.    Past Medical History: Past Medical History:  Diagnosis Date  . Cellulitis    mild.  L leg  . CHF (congestive heart failure) (HCC)    Dr. Norman HerrlichBrian Munley  . Complication of anesthesia    'hard time waking up"  . Difficulty voiding   . 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  . Enlarged prostate   . Hand fracture, right    Dr. Linda HedgesJeffrey Yaste  . Hypothyroidism (acquired)   . Leg ulcer, left (HCC)   . PAD (peripheral artery disease) (HCC)   . Periorbital hematoma, right   . Peripheral neuropathy   . Toe ulcer (HCC)    Left  . Wound of left leg    Non-healing. Dr. Liston AlbaPedro Hernandez, Dr. Bynum Bellowsitorya Strover.    Past Surgical History: Past Surgical History:  Procedure Laterality Date  . AMPUTATION Left 02/28/2018   Procedure: LEFT BELOW KNEE AMPUTATION;  Surgeon: Nadara Mustarduda, Marcus V, MD;  [facility];  Service: Orthopedics;  Laterality: Left;  . AORTOGRAM Left 12/12/2017   Procedure: AORTOGRAM LEFT LOWER EXTREMITY RUNOFF;  Surgeon: Fransisco Hertzhen, Brian L, MD;  [facility];  Service:  Vascular;  Laterality: Left;  . BELOW KNEE LEG AMPUTATION Left 02/28/2018  . CYST EXCISION    . PERIPHERAL VASCULAR BALLOON ANGIOPLASTY Left 12/12/2017   Procedure: PERIPHERAL VASCULAR BALLOON ANGIOPLASTY LEFT SFA;  Surgeon: Fransisco Hertz, MD;  Location: [facility] OR;  Service: Vascular;  Laterality: Left;  . PILONIDAL CYST DRAINAGE    . STUMP REVISION Left 03/07/2018   Procedure: REVISION LEFT BELOW KNEE AMPUTATION VS. LEFT ABOVE KNEE AMPUTATION;  Surgeon: Nadara Mustard, MD;  Location: [facility] OR;   Service: Orthopedics;  Laterality: Left;     Allergies:   Allergies  Allergen Reactions  . Oxytetracycline Itching, Swelling and Other (See Comments)    Facial swelling  . Sulfa Antibiotics Rash     Social History:  reports that he has been smoking cigarettes. He has a 25.00 pack-year smoking history. He has never used smokeless tobacco. He reports that he drank alcohol. He reports that he has current or past drug history.   Family History: Family History  Problem Relation Age of Onset  . Hypertension Mother   . Hyperlipidemia Mother   . Heart attack Father   . Diabetes Father   . Hypertension Father   . Hyperlipidemia Father   . Stroke Maternal Grandmother       Physical Exam: Vitals:   03/07/18 2251 03/08/18 0115 03/08/18 0524 03/08/18 1007  BP: (!) 88/55 (!) 81/65 (!) 87/58 94/60  Pulse: 96 97 93 87  Resp: 16 15    Temp: 98.4 F (36.9 C) 98.9 F (37.2 C) 98.3 F (36.8 C)   TempSrc: Oral Oral Oral   SpO2: 95% 96% 94%   Weight:      Height:        Constitutional: lying in bed, no distress Eyes: anicteric, EOMI ENMT: normal dentition CVS: RRR, no murmurs, rubs or gallops appreciated. Scant edema of left leg Respiratory:  Normal respiratory effort on room air Abdomen: soft, non-distended, non-tender, normal bowel sounds Musculoskeletal: : L AKA with VAC in place Neuro: no focal neurologic deficits Psych: appropriate affect and mood GU: significant scrotal edema with foley catheter in place   Data reviewed:  I have personally reviewed following labs and imaging studies Labs:  CBC: Recent Labs  Lab 03/07/18 1105 03/07/18 1923 03/08/18 0357  WBC 14.0*  --  13.0*  HGB 9.6* 7.8* 9.0*  HCT 30.4* 24.1* 27.6*  MCV 101.0*  --  95.5  PLT 311  --  325    Basic Metabolic Panel: Recent Labs  Lab 03/07/18 1105  NA 137  K 4.0  CL 102  CO2 24  GLUCOSE 93  BUN 20  CREATININE 0.67  CALCIUM 8.4*   GFR Estimated Creatinine Clearance: 114.6 mL/min  (by C-G formula based on SCr of 0.67 mg/dL). Liver Function Tests: Recent Labs  Lab 03/07/18 1105  AST 73*  ALT 143*  ALKPHOS 81  BILITOT 0.6  PROT 6.3*  ALBUMIN 2.0*   No results for input(s): LIPASE, AMYLASE in the last 168 hours. No results for input(s): AMMONIA in the last 168 hours. Coagulation profile Recent Labs  Lab 03/07/18 1206  INR 1.20    Cardiac Enzymes: No results for input(s): CKTOTAL, CKMB, CKMBINDEX, TROPONINI in the last 168 hours. BNP: Invalid input(s): POCBNP CBG: No results for input(s): GLUCAP in the last 168 hours. D-Dimer No results for input(s): DDIMER in the last 72 hours. Hgb A1c No results for input(s): HGBA1C in the last 72 hours. Lipid Profile No results for input(s): CHOL,  HDL, LDLCALC, TRIG, CHOLHDL, LDLDIRECT in the last 72 hours. Thyroid function studies No results for input(s): TSH, T4TOTAL, T3FREE, THYROIDAB in the last 72 hours.  Invalid input(s): FREET3 Anemia work up No results for input(s): VITAMINB12, FOLATE, FERRITIN, TIBC, IRON, RETICCTPCT in the last 72 hours. Urinalysis    Component Value Date/Time   COLORURINE YELLOW 12/06/2017 2100   APPEARANCEUR HAZY (A) 12/06/2017 2100   LABSPEC 1.017 12/06/2017 2100   PHURINE 5.0 12/06/2017 2100   GLUCOSEU NEGATIVE 12/06/2017 2100   HGBUR MODERATE (A) 12/06/2017 2100   BILIRUBINUR NEGATIVE 12/06/2017 2100   KETONESUR NEGATIVE 12/06/2017 2100   PROTEINUR NEGATIVE 12/06/2017 2100   NITRITE NEGATIVE 12/06/2017 2100   LEUKOCYTESUR NEGATIVE 12/06/2017 2100     Microbiology No results found for this or any previous visit (from the past 240 hour(s)).     Inpatient Medications:   Scheduled Meds: . aspirin EC  81 mg Oral Daily  . atorvastatin  40 mg Oral Daily  . carvedilol  3.125 mg Oral BID WC  . clopidogrel  75 mg Oral Q breakfast  . docusate sodium  100 mg Oral BID  . DULoxetine  30 mg Oral QHS  . furosemide  20 mg Oral Q M,W,F  . gabapentin  300 mg Oral TID  .  levothyroxine  100 mcg Oral QAC breakfast  . sacubitril-valsartan  1 tablet Oral Once per day on Mon Wed Fri  . spironolactone  25 mg Oral Daily  . tamsulosin  0.4 mg Oral QPC breakfast   Continuous Infusions: . sodium chloride 10 mL/hr at 03/07/18 1930  . methocarbamol (ROBAXIN) IV       Radiological Exams on Admission: No results found.

## 2018-03-09 LAB — CBC
HEMATOCRIT: 26.5 % — AB (ref 39.0–52.0)
Hemoglobin: 8.6 g/dL — ABNORMAL LOW (ref 13.0–17.0)
MCH: 31 pg (ref 26.0–34.0)
MCHC: 32.5 g/dL (ref 30.0–36.0)
MCV: 95.7 fL (ref 78.0–100.0)
PLATELETS: 344 10*3/uL (ref 150–400)
RBC: 2.77 MIL/uL — ABNORMAL LOW (ref 4.22–5.81)
RDW: 14.7 % (ref 11.5–15.5)
WBC: 10.8 10*3/uL — AB (ref 4.0–10.5)

## 2018-03-09 NOTE — Progress Notes (Signed)
Patient ID: Peter SimonsRobert T Hollywood, male   DOB: 12/07/1955, 62 y.o.   MRN: [id number] No acute changes.  Reports feeling fine overall.  Anticipate discharge to skilled nursing early this week.  VAC still to suction with good seal.

## 2018-03-09 NOTE — Progress Notes (Signed)
Medical Consultation-Follow up note   Peter SimonsRobert T Cochran  ZOX:[id number]  DOB: Apr 27, 1956  DOA: 03/07/2018  PCP: Floria RavelingWilson, Ashley H, FNP   Requesting physician: Dr. Lajoyce CornersUda Date of consultation: 03/07/2018 Reason for consultation: Management of other medical comorbidities   Impression/Recommendations Principal Problem:   BKA stump complication (HCC) Active Problems:   PAD (peripheral artery disease) (HCC)   Chronic systolic heart failure (HCC)   S/P AKA (above knee amputation) unilateral, left (HCC)   Hypothyroidism  Left BKA dehiscence status post left AKA on 8/23, stable.  Per primary  Chronic Hypotension.  Does have soft BPs (systolics 90s-100s) at baseline per patient and confirmed on chart review ( recent office visits).    Remains asymptomatic.  Will be hesitant for fluid bolus given reduced EF.  Monitor  Acute on chronic anemia, stable.  Baseline hemoglobin 10-12.  On admission hemoglobin 9.6 with a drop to 7.8 shortly after procedure.  Suspect acute blood loss anemia, rebounded without transfusion intervention. Monitor CBC  Leukocytosis, suspect stress related to recent surgery, resolved.  No localizing signs of infection other than wound dehiscence.  Remains afebrile, monitor.  Systolic CHF (EF 81-19%25-30%, 10/2017).  Euvolemic, daily weights, strict I's and O's.  Coreg, Lasix, Entresto, spironolactone per home regimen  Significant scrotal edema, chronic. Reports present for 20+ years. He uses a chronic indwelling foley catheter. Monitor  PAD, stable-on Plavix  Hypothyroidism, stable-home Synthroid  Hyperlipidemia, stable- Home atorvastatin  BPH, stable-on Flomax   Thank you for this consultation.  [facility] will follow the patient with you.     Laverna PeaceShayla D Fredi Geiler M.D. [facility] [email or web address] Password [email or web address]  03/08/2018, 10:56 AM   History of Present Illness: Peter SimonsRobert T Cochran is an 62 y.o. male med hx significant for PAD s/p  angioplasty and stenting to left leg,PVD,CAD, HLD, smoking hospitalized for gangrenous foot in 11/2017 and underwent  L BKA on 02/28/18 and discharged with Prevena VAC on 03/04/18 to SNF. Reportedly patient left the SNF after a day and his VAC had not been working since 03/04/18. He presented with increased drainage from his BKA. He was admitted after being seen in office on 8/23 with plan to revise left aka.    Past Medical History: Past Medical History:  Diagnosis Date  . Cellulitis    mild.  L leg  . CHF (congestive heart failure) (HCC)    Dr. Norman HerrlichBrian Munley  . Complication of anesthesia    'hard time waking up"  . Difficulty voiding   . 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  . Enlarged prostate   . Hand fracture, right    Dr. Linda HedgesJeffrey Yaste  . Hypothyroidism (acquired)   . Leg ulcer, left (HCC)   . PAD (peripheral artery disease) (HCC)   . Periorbital hematoma, right   . Peripheral neuropathy   . Toe ulcer (HCC)    Left  . Wound of left leg    Non-healing. Dr. Liston AlbaPedro Hernandez, Dr. Bynum Bellowsitorya Strover.    Past Surgical History: Past Surgical History:  Procedure Laterality Date  . AMPUTATION Left 02/28/2018   Procedure: LEFT BELOW KNEE AMPUTATION;  Surgeon: Nadara Mustarduda, Marcus V, MD;  [facility];  Service: Orthopedics;  Laterality: Left;  . AORTOGRAM Left 12/12/2017   Procedure: AORTOGRAM LEFT LOWER EXTREMITY RUNOFF;  Surgeon: Fransisco Hertzhen, Brian L, MD;  [facility];  Service: Vascular;  Laterality: Left;  . BELOW KNEE  LEG AMPUTATION Left 02/28/2018  . CYST EXCISION    . PERIPHERAL VASCULAR BALLOON ANGIOPLASTY Left 12/12/2017   Procedure: PERIPHERAL VASCULAR BALLOON ANGIOPLASTY LEFT SFA;  Surgeon: Fransisco Hertz, MD;  Location: [facility];  Service: Vascular;  Laterality: Left;  . PILONIDAL CYST DRAINAGE    . STUMP REVISION Left 03/07/2018   Procedure: REVISION LEFT BELOW KNEE AMPUTATION VS. LEFT ABOVE KNEE AMPUTATION;  Surgeon: Nadara Mustard, MD;  Location: [facility] OR;  Service: Orthopedics;   Laterality: Left;     Allergies:   Allergies  Allergen Reactions  . Oxytetracycline Itching, Swelling and Other (See Comments)    Facial swelling  . Sulfa Antibiotics Rash     Social History:  reports that he has been smoking cigarettes. He has a 25.00 pack-year smoking history. He has never used smokeless tobacco. He reports that he drank alcohol. He reports that he has current or past drug history.   Family History: Family History  Problem Relation Age of Onset  . Hypertension Mother   . Hyperlipidemia Mother   . Heart attack Father   . Diabetes Father   . Hypertension Father   . Hyperlipidemia Father   . Stroke Maternal Grandmother       Physical Exam: Vitals:   03/08/18 2047 03/09/18 0438 03/09/18 0900 03/09/18 1432  BP:  (!) 82/57 (!) 95/58 (!) 91/58  Pulse:  83 88 98  Resp:  18  18  Temp: 98.5 F (36.9 C) 98.1 F (36.7 C)  98.4 F (36.9 C)  TempSrc: Oral Oral  Oral  SpO2:  98%  96%  Weight:      Height:        Constitutional: lying in bed, no distress Eyes: anicteric, EOMI ENMT: normal dentition CVS: RRR, no murmurs, rubs or gallops appreciated. Scant edema of left leg Respiratory:  Normal respiratory effort on room air Abdomen: soft, non-distended, non-tender, normal bowel sounds Musculoskeletal: : L AKA with VAC in place Neuro: no focal neurologic deficits Psych: appropriate affect and mood GU: significant scrotal edema with foley catheter in place   Data reviewed:  I have personally reviewed following labs and imaging studies Labs:  CBC: Recent Labs  Lab 03/07/18 1105 03/07/18 1923 03/08/18 0357 03/09/18 1221  WBC 14.0*  --  13.0* 10.8*  HGB 9.6* 7.8* 9.0* 8.6*  HCT 30.4* 24.1* 27.6* 26.5*  MCV 101.0*  --  95.5 95.7  PLT 311  --  325 344    Basic Metabolic Panel: Recent Labs  Lab 03/07/18 1105  NA 137  K 4.0  CL 102  CO2 24  GLUCOSE 93  BUN 20  CREATININE 0.67  CALCIUM 8.4*   GFR Estimated Creatinine Clearance: 114.6  mL/min (by C-G formula based on SCr of 0.67 mg/dL). Liver Function Tests: Recent Labs  Lab 03/07/18 1105  AST 73*  ALT 143*  ALKPHOS 81  BILITOT 0.6  PROT 6.3*  ALBUMIN 2.0*   No results for input(s): LIPASE, AMYLASE in the last 168 hours. No results for input(s): AMMONIA in the last 168 hours. Coagulation profile Recent Labs  Lab 03/07/18 1206  INR 1.20    Cardiac Enzymes: No results for input(s): CKTOTAL, CKMB, CKMBINDEX, TROPONINI in the last 168 hours. BNP: Invalid input(s): POCBNP CBG: No results for input(s): GLUCAP in the last 168 hours. D-Dimer No results for input(s): DDIMER in the last 72 hours. Hgb A1c No results for input(s): HGBA1C in the last 72 hours. Lipid Profile No results for input(s): CHOL, HDL,  LDLCALC, TRIG, CHOLHDL, LDLDIRECT in the last 72 hours. Thyroid function studies No results for input(s): TSH, T4TOTAL, T3FREE, THYROIDAB in the last 72 hours.  Invalid input(s): FREET3 Anemia work up No results for input(s): VITAMINB12, FOLATE, FERRITIN, TIBC, IRON, RETICCTPCT in the last 72 hours. Urinalysis    Component Value Date/Time   COLORURINE YELLOW 12/06/2017 2100   APPEARANCEUR HAZY (A) 12/06/2017 2100   LABSPEC 1.017 12/06/2017 2100   PHURINE 5.0 12/06/2017 2100   GLUCOSEU NEGATIVE 12/06/2017 2100   HGBUR MODERATE (A) 12/06/2017 2100   BILIRUBINUR NEGATIVE 12/06/2017 2100   KETONESUR NEGATIVE 12/06/2017 2100   PROTEINUR NEGATIVE 12/06/2017 2100   NITRITE NEGATIVE 12/06/2017 2100   LEUKOCYTESUR NEGATIVE 12/06/2017 2100     Microbiology No results found for this or any previous visit (from the past 240 hour(s)).     Inpatient Medications:   Scheduled Meds: . aspirin EC  81 mg Oral Daily  . atorvastatin  40 mg Oral Daily  . carvedilol  3.125 mg Oral BID WC  . clopidogrel  75 mg Oral Q breakfast  . docusate sodium  100 mg Oral BID  . DULoxetine  30 mg Oral QHS  . furosemide  20 mg Oral Q M,W,F  . gabapentin  300 mg Oral TID  .  levothyroxine  100 mcg Oral QAC breakfast  . sacubitril-valsartan  1 tablet Oral Once per day on Mon Wed Fri  . spironolactone  25 mg Oral Daily  . tamsulosin  0.4 mg Oral QPC breakfast   Continuous Infusions: . sodium chloride 10 mL/hr at 03/07/18 1930  . methocarbamol (ROBAXIN) IV       Radiological Exams on Admission: No results found.

## 2018-03-09 NOTE — Clinical Social Work Note (Signed)
Clinical Social Work Assessment  Patient Details  Name: Peter SimonsRobert T Cochran MRN: [id number] Date of Birth: Oct 02, 1955  Date of referral:  03/09/18               Reason for consult:  Facility Placement                Permission sought to share information with:    Permission granted to share information::     Name::        Agency::     Relationship::     Contact Information:     Housing/Transportation Living arrangements for the past 2 months:  Single Family Home Source of Information:  Patient Patient Interpreter Needed:  None Criminal Activity/Legal Involvement Pertinent to Current Situation/Hospitalization:  No - Comment as needed Significant Relationships:  Spouse Lives with:  Spouse Do you feel safe going back to the place where you live?  Yes Need for family participation in patient care:  Yes (Comment)  Care giving concerns:  Pt is alert and oriented.   Social Worker assessment / plan:  CSW spoke with pt at bedside. Pt is refusing SNF. Pt states that his spouse is home and when she has to leave his nephew is there. RNCM updated. Clinical Social Worker will sign off for now as social work intervention is no longer needed. Please consult us again if new need arises.    Employment status:  Disabled (Comment on whether or not currently receiving Disability) Insurance information:  Other (Comment Required)(Medcost) PT Recommendations:  Skilled Nursing Facility Information / Referral to community resources:  Skilled Nursing Facility  Patient/Family's Response to care:  Pt declines any questions regarding treatment plan at this time.  Patient/Family's Understanding of and Emotional Response to Diagnosis, Current Treatment, and Prognosis:  Pt is aware of SNF recommendation. Pt is refusing SNF. Pt states he will go home with home health. Pt states his spouse and nephew will be there with him. RNCM updated.   Emotional Assessment Appearance:  Appears stated  age Attitude/Demeanor/Rapport:  (Patient was appropriate) Affect (typically observed):  Accepting, Appropriate Orientation:  Oriented to Self, Oriented to Place, Oriented to  Time, Oriented to Situation Alcohol / Substance use:  Not Applicable Psych involvement (Current and /or in the community):  No (Comment)  Discharge Needs  Concerns to be addressed:  Basic Needs, Care Coordination Readmission within the last 30 days:  Yes Current discharge risk:  Dependent with Mobility Barriers to Discharge:  Continued Medical Work up   Pacific MutualBridget A Gomer France, LCSW 03/09/2018, 10:21 AM

## 2018-03-09 NOTE — Plan of Care (Signed)
  Problem: Self-Care: Goal: Ability to meet self-care needs will improve Outcome: Progressing   Problem: Self-Concept: Goal: Ability to maintain and perform role responsibilities to the fullest extent possible will improve Outcome: Progressing   Problem: Pain Management: Goal: Pain level will decrease with appropriate interventions Outcome: Progressing   Problem: Skin Integrity: Goal: Demonstration of wound healing without infection will improve Outcome: Progressing   

## 2018-03-09 NOTE — Clinical Social Work Note (Signed)
Pt refusing SNF. RNCM updated. Clinical Social Worker will sign off for now as social work intervention is no longer needed. Please consult us again if new need arises.   Clarisse GougeBridget A Lashara Urey 03/09/2018

## 2018-03-10 LAB — CBC
HCT: 29.4 % — ABNORMAL LOW (ref 39.0–52.0)
Hemoglobin: 9.5 g/dL — ABNORMAL LOW (ref 13.0–17.0)
MCH: 31.3 pg (ref 26.0–34.0)
MCHC: 32.3 g/dL (ref 30.0–36.0)
MCV: 96.7 fL (ref 78.0–100.0)
PLATELETS: 398 10*3/uL (ref 150–400)
RBC: 3.04 MIL/uL — ABNORMAL LOW (ref 4.22–5.81)
RDW: 14.7 % (ref 11.5–15.5)
WBC: 9.6 10*3/uL (ref 4.0–10.5)

## 2018-03-10 MED ORDER — OXYCODONE-ACETAMINOPHEN 5-325 MG PO TABS: 1.0000 | ORAL_TABLET | ORAL | 0 refills | Status: DC | PRN

## 2018-03-10 NOTE — Care Management Note (Signed)
Case Management Note  Patient Details  Name: Peter SimonsRobert T Radcliffe MRN: [id number] Date of Birth: [birth year]  Subjective/Objective:    BKA               Action/Plan: Patient is refusing to return to SNF at discharge and plans [facility] to [facility]; DME - walker, crutches at home; he is requesting a 3:1 and slide board; Fayrene FearingJames with [facility] called for DME - to be delivered to the room today prior to dc; [facility].  Expected Discharge Date:  03/10/18               Expected Discharge Plan:  Home w Home Health Services  Discharge planning Services  CM Consult   Choice offered to:  Patient, Spouse  DME Arranged:  3-N-1 DME Agency:  [facility]  HH Arranged:  RN, PT, OT, Nurse's Aide [facility]  Status of Service:  In process, will continue to follow  Reola MosherChandler, Thy Gullikson L, RN,MHA,BSN [phone] 03/10/2018, 10:57 AM

## 2018-03-10 NOTE — Progress Notes (Signed)
                Medical Consultation-Follow up note(no charge)   [name]  ZOX:[id number]  DOB: [birth year]  DOA: 03/07/2018  PCP: Floria RavelingWilson, Ashley H, Peter SimonsFNP   Requesting physician: Dr. Lajoyce CornersUda Date of consultation: 03/07/2018 Reason for consultation: Management of other medical comorbidities   Impression/Recommendations Principal Problem:   BKA stump complication (HCC) Active Problems:   PAD (peripheral artery disease) (HCC)   Chronic systolic heart failure (HCC)   S/P AKA (above knee amputation) unilateral, left (HCC)   Hypothyroidism  Left BKA dehiscence status post left AKA on 8/23, stable.  Per primary. Patient discharging today to home with PT therapy arranged  Chronic Hypotension.  Does have soft BPs (systolics 90s-100s) at baseline per patient and confirmed on chart review ( recent office visits).    Remains asymptomatic.    Acute on chronic anemia, stable.  Baseline hemoglobin 10-12.  On admission hemoglobin 9.6 with a drop to 7.8 shortly after procedure.  Suspect acute blood loss anemia, rebounded without transfusion intervention.   Leukocytosis, suspect stress related to recent surgery, resolved.  No localizing signs of infection other than wound dehiscence.  Remains afebrile, monitor.  Systolic CHF (EF 81-19%25-30%, 10/2017).  Euvolemic, daily weights, strict I's and O's.  Coreg, Lasix, Entresto, spironolactone per home regimen  Significant scrotal edema, chronic. Reports present for 20+ years. He uses a chronic indwelling foley catheter.   PAD, stable-on Plavix  Hypothyroidism, stable-home Synthroid  Hyperlipidemia, stable- Home atorvastatin  BPH, stable-on Flomax  Patient going home with stable chronic medical conditions. No further recommendations on our part.  Thank you for this consultation.  [facility] will sign off     [name] M.D. [facility] [email or web address] Password [facility]  03/08/2018, 10:56 AM   History of Present  Illness: Peter SimonsRobert T Cochran is an 62 y.o. 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 Reportedly patient left the SNF after a day and his VAC had not been working since 03/04/18. He presented with increased drainage from his BKA. He was admitted after being seen in office on 8/23 with plan to revise left aka.

## 2018-03-10 NOTE — Progress Notes (Signed)
Received call from Physical Therapist, patient needs a hospital bed, patient is in agreement; need note from Physical Therapist as of need for Hospital Bed and voice message left with the Attending MD; pt does not need to stay in the hosp for the delivery of hosp bed; CM informed patient that [facility] will be in contact with the patient for the delivery of the bed. Abelino DerrickB Jaquelyne Firkus RN,MHA,BSN

## 2018-03-10 NOTE — Progress Notes (Signed)
Once patient was dressing for discharge, the dressing folded back from leg. There continued to be a leak even with interventions. Had to eventually take off wound vac bandage. Dr. Lajoyce Cornersuda notified of situation. 4x4, ABD, and ACE wraps were applied to leg prior to discharge. Patient tolerated well.

## 2018-03-10 NOTE — Progress Notes (Signed)
Physical Therapy Treatment Patient Details Name: Peter Cochran MRN: [id number] DOB: 01/09/1956 Today's Date: 03/10/2018    History of Present Illness Peter Cochran is an 62 y.o. 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 Reportedly patient left the SNF after a day and his VAC had not been working since 03/04/18. He presented with increased drainage from his BKA. He was admitted after being seen in office on 8/23 with plan to revise to left aka.     PT Comments    Pt performed supine to sit and sit to supine as well as lateral scoot to and from commode.  Pt is slow and gaurded and requires mod to max assistance to complete transfers.  Wife performed transfers with patient to ensure she is competent to manage patient at home.  Pt is limited due to pain and decreased strength and will require HHPT as he is refusing SNF placement.      Follow Up Recommendations  Home health PT;Supervision/Assistance - 24 hour     Equipment Recommendations  Other (comment)(hospital bed with trapeze frame, 30 inch slide board and  drop arm commode)    Recommendations for Other Services       Precautions / Restrictions Precautions Precautions: Fall Precaution Comments: left AKA - VAC intact.  Swollen Scrotum = catheter intact, open wound on bottom with purulent odorous drainage.   Restrictions Weight Bearing Restrictions: Yes LLE Weight Bearing: Non weight bearing Other Position/Activity Restrictions: pts right leg with poor circulation and ulcers - needs to protect this leg, R leg is weeping as well.      Mobility  Bed Mobility Overal bed mobility: Needs Assistance Bed Mobility: Supine to Sit;Sit to Supine     Supine to sit: Mod assist Sit to supine: Min assist   General bed mobility comments: Pt remains to have a  difficult time coming to sitting at edge of bed.  Pt is heavily reliant on use of bed rails to mobilize in the bed.  Pt required assistance from his wife for trunk elevation and LE advancement.  Pt able to come return to supine with min assistance and heavy reliance of UEs and bed rails to boost in supine.    Transfers Overall transfer level: Needs assistance Equipment used: None Transfers: Lateral/Scoot Transfers     Squat pivot transfers: Mod assist;Max assist     General transfer comment: Pt performed lateral scoot to drop arm commode with moderate assistance from his wife. To return back to bed he required max assistance to transfer to his surgical side back into bed.  Pt with poor trunk control during transfer and required assistance to maintain balance.  Post transfer back to bed, PTA simulated use and technique of slide board into car.  Wife present and able to teach back method to PTA.    Ambulation/Gait Ambulation/Gait assistance: (NT (unable))               Stairs             Wheelchair Mobility    Modified Rankin (Stroke Patients Only)       Balance  Cognition Arousal/Alertness: Awake/alert Behavior During Therapy: WFL for tasks assessed/performed Overall Cognitive Status: Within Functional Limits for tasks assessed                                 General Comments: Somewhat decreased insight into deficits      Exercises      General Comments        Pertinent Vitals/Pain Pain Assessment: Faces Faces Pain Scale: Hurts even more Pain Location: left residual limb Pain Descriptors / Indicators: Aching;Discomfort Pain Intervention(s): Monitored during session;Repositioned    Home Living                      Prior Function            PT Goals (current goals can now be found in the care plan section) Acute Rehab PT Goals Patient Stated Goal: "go  home" Potential to Achieve Goals: Poor Progress towards PT goals: Progressing toward goals    Frequency    Min 2X/week      PT Plan Discharge plan needs to be updated    Co-evaluation PT/OT/SLP Co-Evaluation/Treatment: Yes Reason for Co-Treatment: Complexity of the patient's impairments (multi-system involvement) PT goals addressed during session: Mobility/safety with mobility        AM-PAC PT "6 Clicks" Daily Activity  Outcome Measure  Difficulty turning over in bed (including adjusting bedclothes, sheets and blankets)?: Unable Difficulty moving from lying on back to sitting on the side of the bed? : Unable Difficulty sitting down on and standing up from a chair with arms (e.g., wheelchair, bedside commode, etc,.)?: Unable Help needed moving to and from a bed to chair (including a wheelchair)?: A Lot Help needed walking in hospital room?: Total Help needed climbing 3-5 steps with a railing? : Total 6 Click Score: 7    End of Session   Activity Tolerance: Patient tolerated treatment well;Patient limited by fatigue Patient left: in bed;with call bell/phone within reach Nurse Communication: Mobility status;Need for lift equipment PT Visit Diagnosis: Other abnormalities of gait and mobility (R26.89);Muscle weakness (generalized) (M62.81)     Time: [id number] PT Time Calculation (min) (ACUTE ONLY): 39 min  Charges:  $Therapeutic Activity: 8-22 mins                     Joycelyn RuaAimee Shahrzad Koble, PTA pager [phone]    Peter AversAimee J Shauntell Iglesia 03/10/2018, 1:16 PM

## 2018-03-10 NOTE — Progress Notes (Signed)
RN spoke with Dr. Lajoyce Cornersuda about wound vac, large wound vac works in the room. However, when patient is connected to prevena plus to go home, it shows a leak detected. Dr. Lajoyce Cornersuda was called about the situation. Dr. Lajoyce Cornersuda, instructed RN to try and reinforce amputated area. If the reinforcement does not work, then take dressing off and apply 4X4, ABD pad, and ACE wrap per MD.   At this time RN is still trying to reinforce area, and changed out prevena wound vac. Awaiting to prevena to detect leaks.

## 2018-03-10 NOTE — Evaluation (Signed)
Occupational Therapy Evaluation Patient Details Name: Peter SimonsRobert T Cochran MRN: [id number] DOB: 1955-12-12 Today's Date: 03/10/2018    History of Present Illness Peter SimonsRobert T Cochran is an 62 y.o. 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 Reportedly patient left the SNF after a day and his VAC had not been working since 03/04/18. He presented with increased drainage from his BKA. He was admitted after being seen in office on 8/23 with plan to revise to left aka.    Clinical Impression   Pt is assisted for ADL and transfers at home. He has not walked since April. Pt presents with generalized weakness, with open, draining wound on buttocks and weeping R LE. He has a wound vac on his residual limb. Pt performed bed mobility and lateral transfers with his wife and mod to max assist. Pt requires set up to total assist for ADL. Pt is adamant he wants to go home today. Agreeable to home health services, hospital bed, transfer board and drop arm commode.    Follow Up Recommendations  Home health OT;Supervision/Assistance - 24 hour(home health aide)    Equipment Recommendations  Hospital bed(trapeze, drop arm commode, 30 inch sliding board)    Recommendations for Other Services       Precautions / Restrictions Precautions Precautions: Fall Precaution Comments: left AKA - VAC intact.  Swollen Scrotum = catheter intact, open wound on bottom with purulent odorous drainage.   Restrictions Weight Bearing Restrictions: Yes LLE Weight Bearing: Non weight bearing Other Position/Activity Restrictions: pts right leg with poor circulation and ulcers - needs to protect this leg, R leg is weeping as well.        Mobility Bed Mobility Overal bed mobility: Needs Assistance Bed Mobility: Supine to Sit;Sit to Supine     Supine to sit: Mod  assist Sit to supine: Min assist   General bed mobility comments: requires increased time, heavy use of rail and assist to raise trunk, assist for L LE back into bed  Transfers Overall transfer level: Needs assistance Equipment used: None Transfers: Lateral/Scoot Transfers     Squat pivot transfers: Mod assist;Max assist    Lateral/Scoot Transfers: Mod assist;Max assist General transfer comment: performed lateral transfer to drop arm commode with moderate assistance, max assist to return to bed toward weaker side, but accomplishe safely with wife     Balance Overall balance assessment: Needs assistance   Sitting balance-Leahy Scale: Fair                                     ADL either performed or assessed with clinical judgement   ADL Overall ADL's : Needs assistance/impaired Eating/Feeding: Set up;Sitting   Grooming: Set up;Sitting   Upper Body Bathing: Minimal assistance;Sitting   Lower Body Bathing: Total assistance;Bed level   Upper Body Dressing : Minimal assistance;Sitting   Lower Body Dressing: Total assistance;Bed level   Toilet Transfer: Ambulation;BSC;Requires drop arm;Moderate assistance   Toileting- Clothing Manipulation and Hygiene: Total assistance;Bed level         General ADL Comments: Wife demonstrated ability to assist pt with bed mobility and transfers to and from drop arm BSC. Recommended hospital bed with trapeze for home use.     Vision Patient Visual Report: No change from baseline  Vision Assessment?: No apparent visual deficits     Perception     Praxis      Pertinent Vitals/Pain Pain Assessment: Faces Faces Pain Scale: Hurts even more Pain Location: left residual limb Pain Descriptors / Indicators: Aching;Discomfort Pain Intervention(s): Monitored during session;Repositioned     Hand Dominance Right   Extremity/Trunk Assessment Upper Extremity Assessment Upper Extremity Assessment: Generalized weakness(R  shoulder weaker than L)   Lower Extremity Assessment Lower Extremity Assessment: Defer to PT evaluation   Cervical / Trunk Assessment Cervical / Trunk Assessment: Kyphotic   Communication Communication Communication: No difficulties   Cognition Arousal/Alertness: Awake/alert Behavior During Therapy: WFL for tasks assessed/performed Overall Cognitive Status: Within Functional Limits for tasks assessed                                 General Comments: Somewhat decreased insight into deficits and very focused on going home today   General Comments       Exercises     Shoulder Instructions      Home Living Family/patient expects to be discharged to:: Private residence Living Arrangements: Spouse/significant other Available Help at Discharge: Family;Available 24 hours/day Type of Home: House Home Access: Ramped entrance     Home Layout: One level               Home Equipment: Walker - 2 wheels;Wheelchair - manual   Additional Comments: After BKA pt went to SNF and said they never came when he called - always needed +2 and never came back.  He went home and h e says it went well (But VAC didnt work).  Pt has had HH for a while - not moving well since April. He says wife can handle him - he pulls up on kitchen sink and wife puts [facility] under him and then cleans him up - will be harder now with AKA.  pt says wife works 2 twelve hour days and then 3 days off.  nephew takes care of him while wife works.      Prior Functioning/Environment Level of Independence: Needs assistance  Gait / Transfers Assistance Needed: Assistance required to transfer to and from w/c. Sits in recliner majority of the day. States he was working on "walking" with HHPT.  ADL's / Homemaking Assistance Needed: Max assist for ADL. Has a foley for urination. Occasionally transfers to [facility] for bowel movement - by pulling on kitchen sink            OT Problem List: Decreased strength;Decreased  range of motion;Decreased activity tolerance;Impaired balance (sitting and/or standing);Decreased safety awareness;Decreased knowledge of use of DME or AE;Decreased knowledge of precautions;Pain      OT Treatment/Interventions: Self-care/ADL training;Therapeutic exercise;Balance training;Patient/family education;Therapeutic activities;DME and/or AE instruction    OT Goals(Current goals can be found in the care plan section) Acute Rehab OT Goals Patient Stated Goal: "go home"  OT Frequency:     Barriers to D/C:            Co-evaluation PT/OT/SLP Co-Evaluation/Treatment: Yes Reason for Co-Treatment: For patient/therapist safety;Complexity of the patient's impairments (multi-system involvement) PT goals addressed during session: Mobility/safety with mobility OT goals addressed during session: ADL's and self-care      AM-PAC PT "6 Clicks" Daily Activity     Outcome Measure Help from another person eating meals?: None Help from another person taking care of personal grooming?: None Help from another person toileting, which includes using toliet,  bedpan, or urinal?: A Little Help from another person bathing (including washing, rinsing, drying)?: A Lot Help from another person to put on and taking off regular upper body clothing?: A Little Help from another person to put on and taking off regular lower body clothing?: Total 6 Click Score: 17   End of Session Equipment Utilized During Treatment: Gait belt Nurse Communication: Mobility status  Activity Tolerance: Patient tolerated treatment well Patient left: in bed;with call bell/phone within reach;with chair alarm set;with family/visitor present  OT Visit Diagnosis: Other abnormalities of gait and mobility (R26.89);Pain Pain - Right/Left: Left Pain - part of body: Leg                Time: 1133-1201 OT Time Calculation (min): 28 min Charges:  OT General Charges $OT Visit: 1 Visit OT Evaluation $OT Eval Moderate Complexity: 1  Mod  Evern Bio 03/10/2018, 3:22 PM  03/10/2018 Martie Round, OTR/L Pager: [phone]

## 2018-03-10 NOTE — Progress Notes (Signed)
Discharge instructions completed with pt. Pt verbalized understanding of the information.  Pt denies chest pain, shortness of breath, dizziness, lightheadedness, and n/v.  Pt discharged home.  

## 2018-03-10 NOTE — Discharge Summary (Signed)
Discharge Diagnoses:  Principal Problem:   BKA stump complication (HCC) Active Problems:   PAD (peripheral artery disease) (HCC)   Chronic systolic heart failure (HCC)   S/P AKA (above knee amputation) unilateral, left (HCC)   Hypothyroidism   Scrotal edema   Anemia   Chronic hypotension   Leukocytosis   Surgeries: Procedure(s): REVISION LEFT BELOW KNEE AMPUTATION VS. LEFT ABOVE KNEE AMPUTATION on 03/07/2018    Consultants: Treatment Team:  [facility] M[facility], Mc6, MD Laverna PeaceNettey, Shayla D, MD  Discharged Condition: Improved  Hospital Course: Alger SimonsRobert T Manganelli is an 62 y.o. male who was admitted 03/07/2018 with a chief complaint of dehiscence left transtibial amputation, with a final diagnosis of Dehiscence Left Below Knee Amputation.  Patient was brought to the operating room on 03/07/2018 and underwent Procedure(s): REVISION LEFT BELOW KNEE AMPUTATION VS. LEFT ABOVE KNEE AMPUTATION.    Patient was given perioperative antibiotics:  Anti-infectives (From admission, onward)   Start     Dose/Rate Route Frequency Ordered Stop   03/07/18 2030  ceFAZolin (ANCEF) IVPB 1 g/50 mL premix     1 g 100 mL/hr over 30 Minutes Intravenous Every 6 hours 03/07/18 1846 03/08/18 1819   03/07/18 1045  ceFAZolin (ANCEF) IVPB 2g/100 mL premix     2 g 200 mL/hr over 30 Minutes Intravenous On call to O.R. 03/07/18 1032 03/07/18 1447    .  Patient was given sequential compression devices, early ambulation, and aspirin for DVT prophylaxis.  Recent vital signs:  Patient Vitals for the past 24 hrs:  BP Temp Temp src Pulse Resp SpO2  03/10/18 0432 (!) 93/59 98.3 F (36.8 C) Oral 86 - 94 %  03/09/18 2355 (!) 125/59 99.3 F (37.4 C) Oral 88 - 98 %  03/09/18 1934 (!) 85/48 97.7 F (36.5 C) Oral 73 - 99 %  03/09/18 1654 91/60 - - 92 - -  03/09/18 1432 (!) 91/58 98.4 F (36.9 C) Oral 98 18 96 %  03/09/18 0900 (!) 95/58 - - 88 - -  .  Recent laboratory studies: No results found.  Discharge Medications:    Allergies as of 03/10/2018      Reactions   Oxytetracycline Itching, Swelling, Other (See Comments)   Facial swelling   Sulfa Antibiotics Rash      Medication List    STOP taking these medications   traMADol 50 MG tablet Commonly known as:  ULTRAM     TAKE these medications   amoxicillin-clavulanate 875-125 MG tablet Commonly known as:  AUGMENTIN Take 1 tablet by mouth 2 (two) times daily.   aspirin EC 81 MG tablet Take 81 mg by mouth daily.   atorvastatin 40 MG tablet Commonly known as:  LIPITOR Take 1 tablet (40 mg total) by mouth daily.   carvedilol 3.125 MG tablet Commonly known as:  COREG Take 1 tablet (3.125 mg total) by mouth 2 (two) times daily with a meal.   clopidogrel 75 MG tablet Commonly known as:  PLAVIX Take 1 tablet (75 mg total) by mouth daily with breakfast.   DULoxetine 30 MG capsule Commonly known as:  CYMBALTA Take 30 mg by mouth at bedtime.   furosemide 20 MG tablet Commonly known as:  LASIX Take 1 tablet (20 mg total) by mouth 3 (three) times a week. On Monday Wednesday friday What changed:    when to take this  additional instructions   levothyroxine 100 MCG tablet Commonly known as:  SYNTHROID, LEVOTHROID Take 100 mcg by mouth daily before breakfast.  multivitamin with minerals Tabs tablet Take 1 tablet by mouth daily.   oxyCODONE-acetaminophen 5-325 MG tablet Commonly known as:  PERCOCET/ROXICET Take 1 tablet by mouth every 4 (four) hours as needed.   sacubitril-valsartan 24-26 MG Commonly known as:  ENTRESTO Take 1 tablet by mouth 3 (three) times a week.   spironolactone 25 MG tablet Commonly known as:  ALDACTONE Take 1 tablet (25 mg total) by mouth daily.   tamsulosin 0.4 MG Caps capsule Commonly known as:  FLOMAX Take 1 capsule (0.4 mg total) by mouth daily after breakfast.       Diagnostic Studies: No results found.  Patient benefited maximally from their hospital stay and there were no complications.      Disposition: Discharge disposition: 01-Home or Self Care      Discharge Instructions    Call MD / Call 911   Complete by:  As directed    If you experience chest pain or shortness of breath, CALL 911 and be transported to the hospital emergency room.  If you develope a fever above 101 F, pus (white drainage) or increased drainage or redness at the wound, or calf pain, call your surgeon's office.   Constipation Prevention   Complete by:  As directed    Drink plenty of fluids.  Prune juice may be helpful.  You may use a stool softener, such as Colace (over the counter) 100 mg twice a day.  Use MiraLax (over the counter) for constipation as needed.   Diet - low sodium heart healthy   Complete by:  As directed    Increase activity slowly as tolerated   Complete by:  As directed    Negative Pressure Wound Therapy - Incisional   Complete by:  As directed    Disconnected from the hospital VAC and attached to the Praveena plus portable wound VAC pump for discharge.     Follow-up Information    Nadara Mustard, MD In 1 week.   Specialty:  Orthopedic Surgery Contact information: [address] [place] [address] [phone]            Signed: Nadara Mustard 03/10/2018, 7:01 AM

## 2018-03-10 NOTE — Progress Notes (Signed)
RN worked with Orvilla Fusommy that works for [employer], we were able to get dressing reinforced and prevena wound vac to work prior to discharge. Director, Jasmine DecemberSharon and Dr. Lajoyce Cornersuda were both notified of situation and end result, knowing that patient was able to go home with prevena.

## 2018-03-14 ENCOUNTER — Encounter (INDEPENDENT_AMBULATORY_CARE_PROVIDER_SITE_OTHER): Payer: Self-pay | Admitting: Physician Assistant

## 2018-03-14 ENCOUNTER — Ambulatory Visit (INDEPENDENT_AMBULATORY_CARE_PROVIDER_SITE_OTHER): Payer: PRIVATE HEALTH INSURANCE | Admitting: Physician Assistant

## 2018-03-14 VITALS — Ht 71.0 in | Wt 206.0 lb

## 2018-03-14 DIAGNOSIS — Z89612 Acquired absence of left leg above knee: Secondary | ICD-10-CM

## 2018-03-14 NOTE — Progress Notes (Signed)
Office Visit Note   Patient: Peter SimonsRobert T Stanaland           Date of Birth: 1956-06-26           MRN: [id number] Visit Date: 03/14/2018              Requested by: Floria RavelingWilson, Ashley H, FNP [address] [place], [place] [id number] PCP: Floria RavelingWilson, Ashley H, FNP   Assessment & Plan: Visit Diagnoses:  1. 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  Gave the patient a prescription for home health nursing to do dressing changes with ABD pads to the incisional area kerlix and Ace wrapping to help control edema.  He needs daily dressing changes.  He can shower and clean the area with Dial soap and water at this point.  He is on Augmentin and will see Dr. Drue SecondSnider of infectious disease on 03/24/2018 per his wife.  They will follow-up here in 1 week.  They can apply some antibiotic ointment to the blistered areas of the left proximal thigh but not to the incisional area.  Follow-Up Instructions: Return in about 1 week (around 03/21/2018).   Orders:  No orders of the defined types were placed in this encounter.  No orders of the defined types were placed in this encounter.     Procedures: No procedures performed   Clinical Data: No additional findings.   Subjective: Chief Complaint  Patient presents with  . Left Leg - Routine Post Op    HPI Patient is a 62 year old male who is seen for postoperative follow-up following revision of his left below the knee amputation to a left above-the-knee amputation on 03/07/2018.  The patient had originally undergone a left below the knee amputation but presented with dehiscence and necrosis of the left below the knee amputation flap.  He underwent the left above-the-knee amputation and had the Praveena VAC placed but they were unable to maintain suction and it was removed prior to the patient's discharge from the hospital.  They have been applying dry dressings to the incisional area and Kerlix  and to wax but he is not really had any compression.  He does have a 3 XL sock/stump shrinker but is unable to don this at this time.  He reports that he is having some pain locally in the stump as well as phantom pains.  Home health has been coming in to the home. Review of Systems   Objective: Vital Signs: Ht 5\' 11"  (1.803 m)   Wt 206 lb (93.4 kg)   BMI 28.73 kg/m   Physical Exam Patient is a elderly appearing chronically ill-appearing male who presents with a wheelchair level. Ortho Exam Examination of the left above-the-knee amputation incision is intact but there is some serosanguineous drainage and the sutures are under fair amount of tension.  There is edema of the stump.  There is no erythema.  He has some blisters and dried skin of the proximal left thigh due to the VAC drape. Specialty Comments:  No specialty comments available.  Imaging: No results found.   PMFS History: Patient Active Problem List   Diagnosis Date Noted  . Scrotal edema 03/08/2018  . Anemia 03/08/2018  . Chronic hypotension 03/08/2018  . Leukocytosis 03/08/2018  . S/P AKA (above knee amputation) unilateral, left (HCC) 03/07/2018  . Hypothyroidism 03/07/2018  . BKA stump complication (HCC)   . Hx  of BKA, left (HCC) 02/28/2018  . Antibiotic long-term use   . AKI (acute kidney injury) (HCC)   . Severe protein-calorie malnutrition (HCC) 12/12/2017  . Bilateral lower leg cellulitis   . Gangrene of toe of left foot (HCC)   . Pressure injury of skin 12/07/2017  . Sepsis due to cellulitis (HCC) 12/06/2017  . Chronic systolic heart failure (HCC) 10/28/2017  . Dilated cardiomyopathy (HCC) 10/27/2017  . Mitral regurgitation 10/27/2017  . Elevated troponin 10/27/2017  . Elevated brain natriuretic peptide (BNP) level 10/27/2017  . PAD (peripheral artery disease) (HCC) 10/27/2017  . 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 Fatigue 10/22/2017   Past Medical History:   Diagnosis Date  . Cellulitis    mild.  L leg  . CHF (congestive heart failure) (HCC)    Dr. Norman Herrlich  . Complication of anesthesia    'hard time waking up"  . Difficulty voiding   . 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  . Enlarged prostate   . Hand fracture, right    Dr. Linda Hedges  . Hypothyroidism (acquired)   . Leg ulcer, left (HCC)   . PAD (peripheral artery disease) (HCC)   . Periorbital hematoma, right   . Peripheral neuropathy   . Toe ulcer (HCC)    Left  . Wound of left leg    Non-healing. Dr. Liston Alba, Dr. Bynum Bellows.    Family History  Problem Relation Age of Onset  . Hypertension Mother   . Hyperlipidemia Mother   . Heart attack Father   . Diabetes Father   . Hypertension Father   . Hyperlipidemia Father   . Stroke Maternal Grandmother     Past Surgical History:  Procedure Laterality Date  . AMPUTATION Left 02/28/2018   Procedure: LEFT BELOW KNEE AMPUTATION;  Surgeon: Nadara Mustard, MD;  Location: [facility] OR;  Service: Orthopedics;  Laterality: Left;  . AORTOGRAM Left 12/12/2017   Procedure: AORTOGRAM LEFT LOWER EXTREMITY RUNOFF;  Surgeon: Fransisco Hertz, MD;  Location: [facility] OR;  Service: Vascular;  Laterality: Left;  . BELOW KNEE LEG AMPUTATION Left 02/28/2018  . CYST EXCISION    . PERIPHERAL VASCULAR BALLOON ANGIOPLASTY Left 12/12/2017   Procedure: PERIPHERAL VASCULAR BALLOON ANGIOPLASTY LEFT SFA;  Surgeon: Fransisco Hertz, MD;  Location: [facility] OR;  Service: Vascular;  Laterality: Left;  . PILONIDAL CYST DRAINAGE    . STUMP REVISION Left 03/07/2018   Procedure: REVISION LEFT BELOW KNEE AMPUTATION VS. LEFT ABOVE KNEE AMPUTATION;  Surgeon: Nadara Mustard, MD;  Location: [facility] OR;  Service: Orthopedics;  Laterality: Left;   Social History   Occupational History  . Not on file  Tobacco Use  . Smoking status: Current Every Day Smoker    Packs/day: 0.50    Years: 50.00    Pack years: 25.00    Types: Cigarettes  . Smokeless tobacco: Never Used  .  Tobacco comment: Less than a pack , "trying to quit"  Substance and Sexual Activity  . Alcohol use: Not Currently  . Drug use: Not Currently  . Sexual activity: Not on file

## 2018-03-20 ENCOUNTER — Encounter (INDEPENDENT_AMBULATORY_CARE_PROVIDER_SITE_OTHER): Payer: Self-pay | Admitting: Physician Assistant

## 2018-03-20 ENCOUNTER — Ambulatory Visit (INDEPENDENT_AMBULATORY_CARE_PROVIDER_SITE_OTHER): Payer: PRIVATE HEALTH INSURANCE | Admitting: Physician Assistant

## 2018-03-20 VITALS — Ht 71.0 in | Wt 206.0 lb

## 2018-03-20 DIAGNOSIS — E43 Unspecified severe protein-calorie malnutrition: Secondary | ICD-10-CM

## 2018-03-20 DIAGNOSIS — I739 Peripheral vascular disease, unspecified: Secondary | ICD-10-CM

## 2018-03-20 DIAGNOSIS — Z89612 Acquired absence of left leg above knee: Secondary | ICD-10-CM

## 2018-03-20 MED ORDER — GABAPENTIN 100 MG PO CAPS: 100.0000 mg | ORAL_CAPSULE | Freq: Three times a day (TID) | ORAL | 1 refills | Status: DC

## 2018-03-20 NOTE — Progress Notes (Addendum)
Office Visit Note   Patient: Peter Cochran           Date of Birth: [birth year]           MRN: [id number] Visit Date: 03/20/2018              Requested by: Floria Raveling, FNP [address] [place], [place] [address] PCP: Floria Raveling, FNP  Chief Complaint  Patient presents with  . 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  Surgery was 03/07/2018.  He has been cleaning the area daily with Dial soap and utilizing Kerlix and an Ace wrap to help control edema.  He is having a lot of phantom pain.  He was unable to tolerate oxycodone secondary to nausea vomiting.  Tylenol was not controlling his pain and he did speak with his primary care provider who is ordered him some tramadol and this seems to help a little bit but he reports a lot of burning and phantom sensation still.  He is not currently on any Neurontin.  We discussed starting Neurontin and he is willing to try this.   Assessment & Plan: Visit Diagnoses: No diagnosis found.  Plan: We will have them apply Iodosorb to the incisional area daily after cleaning and wrapped with Ace wraps and again stump shrinker stocking if able to help control edema.  He does have a 3 XL stump shrinker at home.  We will start gabapentin 100 mg p.o. 3 times daily for the next week and then see if he is tolerating this and increase the dose as appropriate.  Follow-Up Instructions: No follow-ups on file.   Ortho Exam  Patient is alert, oriented, no adenopathy, well-dressed, normal affect, normal respiratory effort. He presents in the wheelchair with his family.  The left above-the-knee amputation shows slight dehiscence over the medial incision line.  The sutures are intact.  There is no odor.  There is some moderate serosanguineous drainage.  There is no cellulitis.  Imaging: No  results found. No images are attached to the encounter.  Labs: Lab Results  Component Value Date   HGBA1C 6.0 (H) 12/07/2017   ESRSEDRATE 75 (H) 02/19/2018   ESRSEDRATE 46 (H) 01/14/2018   ESRSEDRATE 78 (H) 12/07/2017   CRP 79.4 (H) 02/19/2018   CRP 17.2 (H) 01/14/2018   CRP 13.5 (H) 12/07/2017     Lab Results  Component Value Date   ALBUMIN 2.0 (L) 03/07/2018   ALBUMIN 2.4 (L) 12/07/2017   ALBUMIN 2.8 (L) 12/06/2017   PREALBUMIN 10.8 (L) 12/07/2017    Body mass index is 28.73 kg/m.  Orders:  No orders of the defined types were placed in this encounter.  No orders of the defined types were placed in this encounter.    Procedures: No procedures performed  Clinical Data: No additional findings.  ROS:  All other systems negative, except as noted in the HPI. Review of Systems  Objective: Vital Signs: Ht 5\' 11"  (1.803 m)   Wt 206 lb (93.4 kg)   BMI 28.73 kg/m   Specialty Comments:  No specialty comments available.  PMFS History: Patient Active Problem List   Diagnosis Date Noted  . Scrotal edema 03/08/2018  . Anemia 03/08/2018  . Chronic hypotension 03/08/2018  . Leukocytosis 03/08/2018  . S/P AKA (above knee amputation)  unilateral, left (HCC) 03/07/2018  . Hypothyroidism 03/07/2018  . BKA stump complication (HCC)   . Hx of BKA, left (HCC) 02/28/2018  . Antibiotic long-term use   . AKI (acute kidney injury) (HCC)   . Severe protein-calorie malnutrition (HCC) 12/12/2017  . Bilateral lower leg cellulitis   . Gangrene of toe of left foot (HCC)   . Pressure injury of skin 12/07/2017  . Sepsis due to cellulitis (HCC) 12/06/2017  . Chronic systolic heart failure (HCC) 10/28/2017  . Dilated cardiomyopathy (HCC) 10/27/2017  . Mitral regurgitation 10/27/2017  . Elevated troponin 10/27/2017  . Elevated brain natriuretic peptide (BNP) level 10/27/2017  . PAD (peripheral artery disease) (HCC) 10/27/2017  . Chronic ulcer of left lower extremity with fat  layer exposed (HCC) 10/27/2017  . Fatigue 10/22/2017   Past Medical History:  Diagnosis Date  . Cellulitis    mild.  L leg  . CHF (congestive heart failure) (HCC)    Dr. Norman Herrlich  . Complication of anesthesia    'hard time waking up"  . Difficulty voiding   . 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  . Enlarged prostate   . Hand fracture, right    Dr. Linda Hedges  . Hypothyroidism (acquired)   . Leg ulcer, left (HCC)   . PAD (peripheral artery disease) (HCC)   . Periorbital hematoma, right   . Peripheral neuropathy   . Toe ulcer (HCC)    Left  . Wound of left leg    Non-healing. Dr. Liston Alba, Dr. Bynum Bellows.    Family History  Problem Relation Age of Onset  . Hypertension Mother   . Hyperlipidemia Mother   . Heart attack Father   . Diabetes Father   . Hypertension Father   . Hyperlipidemia Father   . Stroke Maternal Grandmother     Past Surgical History:  Procedure Laterality Date  . AMPUTATION Left 02/28/2018   Procedure: LEFT BELOW KNEE AMPUTATION;  Surgeon: Nadara Mustard, MD;  Location: [facility] OR;  Service: Orthopedics;  Laterality: Left;  . AORTOGRAM Left 12/12/2017   Procedure: AORTOGRAM LEFT LOWER EXTREMITY RUNOFF;  Surgeon: Fransisco Hertz, MD;  Location: [facility] OR;  Service: Vascular;  Laterality: Left;  . BELOW KNEE LEG AMPUTATION Left 02/28/2018  . CYST EXCISION    . PERIPHERAL VASCULAR BALLOON ANGIOPLASTY Left 12/12/2017   Procedure: PERIPHERAL VASCULAR BALLOON ANGIOPLASTY LEFT SFA;  Surgeon: Fransisco Hertz, MD;  Location: [facility] OR;  Service: Vascular;  Laterality: Left;  . PILONIDAL CYST DRAINAGE    . STUMP REVISION Left 03/07/2018   Procedure: REVISION LEFT BELOW KNEE AMPUTATION VS. LEFT ABOVE KNEE AMPUTATION;  Surgeon: Nadara Mustard, MD;  Location: [facility] OR;  Service: Orthopedics;  Laterality: Left;   Social History   Occupational History  . Not on file  Tobacco Use  . Smoking status: Current Every Day Smoker    Packs/day: 0.50    Years: 50.00     Pack years: 25.00    Types: Cigarettes  . Smokeless tobacco: Never Used  . Tobacco comment: Less than a pack , "trying to quit"  Substance and Sexual Activity  . Alcohol use: Not Currently  . Drug use: Not Currently  . Sexual activity: Not on file

## 2018-03-24 ENCOUNTER — Encounter: Payer: Self-pay | Admitting: Internal Medicine

## 2018-03-24 ENCOUNTER — Ambulatory Visit (INDEPENDENT_AMBULATORY_CARE_PROVIDER_SITE_OTHER): Payer: PRIVATE HEALTH INSURANCE | Admitting: Internal Medicine

## 2018-03-24 VITALS — BP 92/63 | HR 97 | Temp 98.6°F

## 2018-03-24 DIAGNOSIS — L089 Local infection of the skin and subcutaneous tissue, unspecified: Secondary | ICD-10-CM

## 2018-03-24 DIAGNOSIS — T148XXA Other injury of unspecified body region, initial encounter: Secondary | ICD-10-CM

## 2018-03-24 DIAGNOSIS — T8789 Other complications of amputation stump: Secondary | ICD-10-CM | POA: Diagnosis not present

## 2018-03-24 DIAGNOSIS — T8189XA Other complications of procedures, not elsewhere classified, initial encounter: Secondary | ICD-10-CM

## 2018-03-24 DIAGNOSIS — I96 Gangrene, not elsewhere classified: Secondary | ICD-10-CM | POA: Diagnosis not present

## 2018-03-24 MED ORDER — AMOXICILLIN-POT CLAVULANATE 875-125 MG PO TABS: 1.0000 | ORAL_TABLET | Freq: Two times a day (BID) | ORAL | 0 refills | Status: DC

## 2018-03-24 NOTE — Progress Notes (Signed)
RFV:   Patient ID: Peter Cochran, male   DOB: 05-31-1956, 61 y.o.   MRN: [id number]  HPI Peter Cochran severe vascular disease s/p revascularization though still had gangrenous changes of his toes, he underwent BKA on 8/23  but then needed to be converted to AKA due to wound dehiscence. He was last seen by Peter Cochran's team last week- started on gabapentin for phantom limb pain. Some wound dehiscence on medial aspect of incision. Sutures still in place. Foul smelling.. neurontin is helping. Still has foley in place  Outpatient Encounter Medications as of 03/24/2018  Medication Sig  . amoxicillin-clavulanate (AUGMENTIN) 875-125 MG tablet Take 1 tablet by mouth 2 (two) times daily.  Marland Kitchen aspirin EC 81 MG tablet Take 81 mg by mouth daily.  Marland Kitchen atorvastatin (LIPITOR) 40 MG tablet Take 1 tablet (40 mg total) by mouth daily.  . carvedilol (COREG) 3.125 MG tablet Take 1 tablet (3.125 mg total) by mouth 2 (two) times daily with a meal.  . clopidogrel (PLAVIX) 75 MG tablet Take 1 tablet (75 mg total) by mouth daily with breakfast.  . DULoxetine (CYMBALTA) 30 MG capsule Take 30 mg by mouth at bedtime.   . furosemide (LASIX) 20 MG tablet Take 1 tablet (20 mg total) by mouth 3 (three) times a week. On Monday Wednesday friday (Patient taking differently: Take 20 mg by mouth every Monday, Wednesday, and Friday. )  . gabapentin (NEURONTIN) 100 MG capsule Take 1 capsule (100 mg total) by mouth 3 (three) times daily.  Marland Kitchen levothyroxine (SYNTHROID, LEVOTHROID) 100 MCG tablet Take 100 mcg by mouth daily before breakfast.   . Multiple Vitamin (MULTIVITAMIN WITH MINERALS) TABS tablet Take 1 tablet by mouth daily.  Marland Kitchen oxyCODONE-acetaminophen (PERCOCET/ROXICET) 5-325 MG tablet Take 1 tablet by mouth every 4 (four) hours as needed.  . sacubitril-valsartan (ENTRESTO) 24-26 MG Take 1 tablet by mouth 3 (three) times a week.  . spironolactone (ALDACTONE) 25 MG tablet Take 1 tablet (25 mg total) by mouth daily.  . tamsulosin (FLOMAX)  0.4 MG CAPS capsule Take 1 capsule (0.4 mg total) by mouth daily after breakfast.   No facility-administered encounter medications on file as of 03/24/2018.      Patient Active Problem List   Diagnosis Date Noted  . Scrotal edema 03/08/2018  . Anemia 03/08/2018  . Chronic hypotension 03/08/2018  . Leukocytosis 03/08/2018  . S/P AKA (above knee amputation) unilateral, left (HCC) 03/07/2018  . Hypothyroidism 03/07/2018  . BKA stump complication (HCC)   . Hx of BKA, left (HCC) 02/28/2018  . Antibiotic long-term use   . AKI (acute kidney injury) (HCC)   . Severe protein-calorie malnutrition (HCC) 12/12/2017  . Bilateral lower leg cellulitis   . Gangrene of toe of left foot (HCC)   . Pressure injury of skin 12/07/2017  . Sepsis due to cellulitis (HCC) 12/06/2017  . Chronic systolic heart failure (HCC) 10/28/2017  . Dilated cardiomyopathy (HCC) 10/27/2017  . Mitral regurgitation 10/27/2017  . Elevated troponin 10/27/2017  . Elevated brain natriuretic peptide (BNP) level 10/27/2017  . PAD (peripheral artery disease) (HCC) 10/27/2017  . 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 Fatigue 10/22/2017     Health Maintenance Due  Topic Date Due  . Hepatitis C Screening  22-Jun-1956  . TETANUS/TDAP  05/04/1976  . COLONOSCOPY  05/05/2007  . INFLUENZA VACCINE  02/13/2018     Review of Systems  Physical Exam   BP 92/63   Pulse 97  Temp 98.6 F (37 C) (Oral)    Ext: left leg wond dehiscence medial aspect. With exudate in the area. +foul necrotic smell though no signs of erythema about the stump CBC Lab Results  Component Value Date   WBC 9.6 03/10/2018   RBC 3.04 (L) 03/10/2018   HGB 9.5 (L) 03/10/2018   HCT 29.4 (L) 03/10/2018   PLT 398 03/10/2018   MCV 96.7 03/10/2018   MCH 31.3 03/10/2018   MCHC 32.3 03/10/2018   RDW 14.7 03/10/2018   LYMPHSABS 694 (L) 02/19/2018   MONOABS 1.2 (H) 12/06/2017   EOSABS 773 (H) 02/19/2018     BMET Lab Results  Component Value Date   NA 137 03/07/2018   K 4.0 03/07/2018   CL 102 03/07/2018   CO2 24 03/07/2018   GLUCOSE 93 03/07/2018   BUN 20 03/07/2018   CREATININE 0.67 03/07/2018   CALCIUM 8.4 (L) 03/07/2018   GFRNONAA >60 03/07/2018   GFRAA >60 03/07/2018      Assessment and Plan  Chronic wound s/p bka - would do 2 more weeks of amox/clav plus  to help with wound healing. Also recommended santyl once  Day to small area of exudate so that can see if can improve tissue for wound healing.

## 2018-03-27 ENCOUNTER — Ambulatory Visit (INDEPENDENT_AMBULATORY_CARE_PROVIDER_SITE_OTHER): Payer: PRIVATE HEALTH INSURANCE | Admitting: Physician Assistant

## 2018-03-28 ENCOUNTER — Ambulatory Visit (INDEPENDENT_AMBULATORY_CARE_PROVIDER_SITE_OTHER): Payer: PRIVATE HEALTH INSURANCE | Admitting: Physician Assistant

## 2018-03-28 ENCOUNTER — Encounter (INDEPENDENT_AMBULATORY_CARE_PROVIDER_SITE_OTHER): Payer: Self-pay | Admitting: Physician Assistant

## 2018-03-28 VITALS — Ht 71.0 in | Wt 206.0 lb

## 2018-03-28 DIAGNOSIS — I739 Peripheral vascular disease, unspecified: Secondary | ICD-10-CM

## 2018-03-28 DIAGNOSIS — Z89612 Acquired absence of left leg above knee: Secondary | ICD-10-CM

## 2018-03-28 DIAGNOSIS — E43 Unspecified severe protein-calorie malnutrition: Secondary | ICD-10-CM

## 2018-03-28 MED ORDER — DOXYCYCLINE HYCLATE 100 MG PO CAPS: 100.0000 mg | ORAL_CAPSULE | Freq: Two times a day (BID) | ORAL | 1 refills | Status: DC

## 2018-03-28 MED ORDER — MUPIROCIN 2 % EX OINT: 1.0000 "application " | TOPICAL_OINTMENT | Freq: Every day | CUTANEOUS | 0 refills | Status: DC

## 2018-03-28 NOTE — Progress Notes (Signed)
Office Visit Note   Patient: Peter Cochran           Date of Birth: 12/16/1955           MRN: [id number] Visit Date: 03/28/2018              Requested by: Floria Raveling, FNP [address] [place], [place] [address] PCP: Floria Raveling, FNP  Chief Complaint  Patient presents with  . 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  He is now approximately 3 weeks postop.  He was followed up by infectious disease, Dr. Drue Second and continues on Augmentin.  He has been using some Santyl to the medial incisional dehiscence once daily for the past couple of days.  He is using Neosporin to the other areas of the incision.  He reports the Neurontin did help with his phantom pain and working to increase this to 200 mg 3 times a day.  He reports he got some rash over the stump area after his wife used some Dial soap which was scented and they have not use this again.  He is wearing the silver sock on the right lower extremity and reports that if he does not wear the sock he starts weeping from his venous stasis issues.  He is trying to take some protein supplements but doing marginally well with this.  We have also discussed him starting on some probiotics and he is going to do so.  Assessment & Plan: Visit Diagnoses: No diagnosis found.  Plan: Will add doxycycline 100 mg p.o. twice daily.  Also mupirocin ointment to the incisional line daily after cleansing.  Avoid perfumed or scented soaps.  Continue protein supplements.  Start probiotic at least once daily.  Increase Neurontin 200 mg 3 times daily for the next few days and then will likely increase to 300 mg 3 times daily at next visit.  He will follow-up next Wednesday and see Dr. Lajoyce Corners.  Follow-Up Instructions: No follow-ups on file.    Ortho Exam  Patient is alert, oriented, no adenopathy, well-dressed, normal affect, normal respiratory effort. The left above-the-knee amputation has dehiscence medially.  The sutures are still intact.  He has crusting throughout the rest of the incisional line and this was all removed.  He does have some blanching erythema of the residual stump and again they feel this is related to the scented soap that was used on the stump earlier in the week.  There is odor and minimal serosanguineous drainage from the wound.  Imaging: No results found. No images are attached to the encounter.  Labs: Lab Results  Component Value Date   HGBA1C 6.0 (H) 12/07/2017   ESRSEDRATE 75 (H) 02/19/2018   ESRSEDRATE 46 (H) 01/14/2018   ESRSEDRATE 78 (H) 12/07/2017   CRP 79.4 (H) 02/19/2018   CRP 17.2 (H) 01/14/2018   CRP 13.5 (H) 12/07/2017     Lab Results  Component Value Date   ALBUMIN 2.0 (L) 03/07/2018   ALBUMIN 2.4 (L) 12/07/2017   ALBUMIN 2.8 (L) 12/06/2017   PREALBUMIN 10.8 (L) 12/07/2017    Body mass index is 28.73 kg/m.  Orders:  No orders of the defined types were placed in this encounter.  No orders of  the defined types were placed in this encounter.    Procedures: No procedures performed  Clinical Data: No additional findings.  ROS:  All other systems negative, except as noted in the HPI. Review of Systems  Objective: Vital Signs: Ht 5\' 11"  (1.803 m)   Wt 206 lb (93.4 kg)   BMI 28.73 kg/m   Specialty Comments:  No specialty comments available.  PMFS History: Patient Active Problem List   Diagnosis Date Noted  . Scrotal edema 03/08/2018  . Anemia 03/08/2018  . Chronic hypotension 03/08/2018  . Leukocytosis 03/08/2018  . S/P AKA (above knee amputation) unilateral, left (HCC) 03/07/2018  . Hypothyroidism 03/07/2018  . BKA stump complication (HCC)   . Hx of BKA, left (HCC) 02/28/2018  . Antibiotic long-term use   . AKI (acute kidney injury) (HCC)   . Severe  protein-calorie malnutrition (HCC) 12/12/2017  . Bilateral lower leg cellulitis   . Gangrene of toe of left foot (HCC)   . Pressure injury of skin 12/07/2017  . Sepsis due to cellulitis (HCC) 12/06/2017  . Chronic systolic heart failure (HCC) 10/28/2017  . Dilated cardiomyopathy (HCC) 10/27/2017  . Mitral regurgitation 10/27/2017  . Elevated troponin 10/27/2017  . Elevated brain natriuretic peptide (BNP) level 10/27/2017  . PAD (peripheral artery disease) (HCC) 10/27/2017  . 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 Fatigue 10/22/2017   Past Medical History:  Diagnosis Date  . Cellulitis    mild.  L leg  . CHF (congestive heart failure) (HCC)    Dr. Norman Herrlich  . Complication of anesthesia    'hard time waking up"  . Difficulty voiding   . 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  . Enlarged prostate   . Hand fracture, right    Dr. Linda Hedges  . Hypothyroidism (acquired)   . Leg ulcer, left (HCC)   . PAD (peripheral artery disease) (HCC)   . Periorbital hematoma, right   . Peripheral neuropathy   . Toe ulcer (HCC)    Left  . Wound of left leg    Non-healing. Dr. Liston Alba, Dr. Bynum Bellows.    Family History  Problem Relation Age of Onset  . Hypertension Mother   . Hyperlipidemia Mother   . Heart attack Father   . Diabetes Father   . Hypertension Father   . Hyperlipidemia Father   . Stroke Maternal Grandmother     Past Surgical History:  Procedure Laterality Date  . AMPUTATION Left 02/28/2018   Procedure: LEFT BELOW KNEE AMPUTATION;  Surgeon: Nadara Mustard, MD;  Location: [facility] OR;  Service: Orthopedics;  Laterality: Left;  . AORTOGRAM Left 12/12/2017   Procedure: AORTOGRAM LEFT LOWER EXTREMITY RUNOFF;  Surgeon: Fransisco Hertz, MD;  Location: [facility] OR;  Service: Vascular;  Laterality: Left;  . BELOW KNEE LEG AMPUTATION Left 02/28/2018  . CYST EXCISION    . PERIPHERAL VASCULAR BALLOON ANGIOPLASTY Left 12/12/2017    Procedure: PERIPHERAL VASCULAR BALLOON ANGIOPLASTY LEFT SFA;  Surgeon: Fransisco Hertz, MD;  Location: [facility] OR;  Service: Vascular;  Laterality: Left;  . PILONIDAL CYST DRAINAGE    . STUMP REVISION Left 03/07/2018   Procedure: REVISION LEFT BELOW KNEE AMPUTATION VS. LEFT ABOVE KNEE AMPUTATION;  Surgeon: Nadara Mustard, MD;  Location: [facility] OR;  Service: Orthopedics;  Laterality: Left;   Social History   Occupational History  . Not on file  Tobacco Use  . Smoking status: Current Every Day Smoker  Packs/day: 0.50    Years: 50.00    Pack years: 25.00    Types: Cigarettes  . Smokeless tobacco: Never Used  . Tobacco comment: Less than a pack , "trying to quit"  Substance and Sexual Activity  . Alcohol use: Not Currently  . Drug use: Not Currently  . Sexual activity: Not on file

## 2018-04-02 ENCOUNTER — Ambulatory Visit (INDEPENDENT_AMBULATORY_CARE_PROVIDER_SITE_OTHER): Payer: PRIVATE HEALTH INSURANCE | Admitting: Physician Assistant

## 2018-04-02 ENCOUNTER — Encounter (INDEPENDENT_AMBULATORY_CARE_PROVIDER_SITE_OTHER): Payer: Self-pay | Admitting: Physician Assistant

## 2018-04-02 DIAGNOSIS — Z89612 Acquired absence of left leg above knee: Secondary | ICD-10-CM

## 2018-04-02 DIAGNOSIS — E43 Unspecified severe protein-calorie malnutrition: Secondary | ICD-10-CM

## 2018-04-02 DIAGNOSIS — I739 Peripheral vascular disease, unspecified: Secondary | ICD-10-CM

## 2018-04-02 NOTE — Progress Notes (Signed)
Office Visit Note   Patient: Peter Cochran           Date of Birth: [birth year]           MRN: [id number] Visit Date: 04/02/2018              Requested by: Floria RavelingWilson, Ashley H, FNP [address] [place], [place] [id number] PCP: Floria RavelingWilson, Ashley H, FNP  No chief complaint on file.     HPI: Patient is a 62 year old male who seen for postoperative follow-up following a left above-the-knee amputation on 03/07/2018.  He is very pleased with his progress over the past week.  He reports that the redness and tenderness is overall much improved.  His edema is also significantly improved.  He is taking gabapentin for pain although he reports this is not changed things much.  He is only taking an occasional pain pill at this point however.  He is working on protein supplementation as well.  Assessment & Plan: Visit Diagnoses:  1. 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 Severe protein-calorie malnutrition (HCC)   3. PAD (peripheral artery disease) (HCC)     Plan: Patient will continue Bactroban to the incisional area cover with Telfa Curlex Ace wrap.  He was given a prescription for [facility] for a K2 prosthesis prosthetic supplies and stump shrinkers.  Suspect that the patient will be able to be a limited community ambulator with the potential for ambulation to traverse low level environmental barriers such as curbs stairs or uneven surfaces.  He will follow-up in 1 week.  We will leave the sutures in for an additional week and these can likely be harvested next week.  Follow-Up Instructions: Return in about 1 week (around 04/09/2018).   Ortho Exam  Patient is alert, oriented, no adenopathy, well-dressed, normal affect, normal respiratory effort. Patient presents at the wheelchair level with his family.  The left above-the-knee amputation incision is much improved with decreased dehiscence and tension on the sutures.  The sutures were left in place again this week.  There is no  residual erythema.  The edema is markedly improved.  No signs of cellulitis today.  Imaging: No results found. No images are attached to the encounter.  Labs: Lab Results  Component Value Date   HGBA1C 6.0 (H) 12/07/2017   ESRSEDRATE 75 (H) 02/19/2018   ESRSEDRATE 46 (H) 01/14/2018   ESRSEDRATE 78 (H) 12/07/2017   CRP 79.4 (H) 02/19/2018   CRP 17.2 (H) 01/14/2018   CRP 13.5 (H) 12/07/2017     Lab Results  Component Value Date   ALBUMIN 2.0 (L) 03/07/2018   ALBUMIN 2.4 (L) 12/07/2017   ALBUMIN 2.8 (L) 12/06/2017   PREALBUMIN 10.8 (L) 12/07/2017    There is no height or weight on file to calculate BMI.  Orders:  No orders of the defined types were placed in this encounter.  No orders of the defined types were placed in this encounter.    Procedures: No procedures performed  Clinical Data: No additional findings.  ROS:  All other systems negative, except as noted in the HPI. Review of Systems  Objective: Vital Signs: There were no vitals taken for this visit.  Specialty Comments:  No specialty comments available.  PMFS History: Patient Active Problem List   Diagnosis Date Noted  . Scrotal edema 03/08/2018  . Anemia 03/08/2018  . Chronic hypotension 03/08/2018  . Leukocytosis 03/08/2018  . S/P AKA (above knee amputation)  unilateral, left (HCC) 03/07/2018  . Hypothyroidism 03/07/2018  . BKA stump complication (HCC)   . Hx of BKA, left (HCC) 02/28/2018  . Antibiotic long-term use   . AKI (acute kidney injury) (HCC)   . Severe protein-calorie malnutrition (HCC) 12/12/2017  . Bilateral lower leg cellulitis   . Gangrene of toe of left foot (HCC)   . Pressure injury of skin 12/07/2017  . Sepsis due to cellulitis (HCC) 12/06/2017  . Chronic systolic heart failure (HCC) 10/28/2017  . Dilated cardiomyopathy (HCC) 10/27/2017  . Mitral regurgitation 10/27/2017  . Elevated troponin 10/27/2017  . Elevated brain natriuretic peptide (BNP) level 10/27/2017  .  PAD (peripheral artery disease) (HCC) 10/27/2017  . 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 Fatigue 10/22/2017   Past Medical History:  Diagnosis Date  . Cellulitis    mild.  L leg  . CHF (congestive heart failure) (HCC)    Dr. Norman Herrlich  . Complication of anesthesia    'hard time waking up"  . Difficulty voiding   . 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  . Enlarged prostate   . Hand fracture, right    Dr. Linda Hedges  . Hypothyroidism (acquired)   . Leg ulcer, left (HCC)   . PAD (peripheral artery disease) (HCC)   . Periorbital hematoma, right   . Peripheral neuropathy   . Toe ulcer (HCC)    Left  . Wound of left leg    Non-healing. Dr. Liston Alba, Dr. Bynum Bellows.    Family History  Problem Relation Age of Onset  . Hypertension Mother   . Hyperlipidemia Mother   . Heart attack Father   . Diabetes Father   . Hypertension Father   . Hyperlipidemia Father   . Stroke Maternal Grandmother     Past Surgical History:  Procedure Laterality Date  . AMPUTATION Left 02/28/2018   Procedure: LEFT BELOW KNEE AMPUTATION;  Surgeon: Nadara Mustard, MD;  Location: [facility] OR;  Service: Orthopedics;  Laterality: Left;  . AORTOGRAM Left 12/12/2017   Procedure: AORTOGRAM LEFT LOWER EXTREMITY RUNOFF;  Surgeon: Fransisco Hertz, MD;  Location: [facility] OR;  Service: Vascular;  Laterality: Left;  . BELOW KNEE LEG AMPUTATION Left 02/28/2018  . CYST EXCISION    . PERIPHERAL VASCULAR BALLOON ANGIOPLASTY Left 12/12/2017   Procedure: PERIPHERAL VASCULAR BALLOON ANGIOPLASTY LEFT SFA;  Surgeon: Fransisco Hertz, MD;  Location: [facility] OR;  Service: Vascular;  Laterality: Left;  . PILONIDAL CYST DRAINAGE    . STUMP REVISION Left 03/07/2018   Procedure: REVISION LEFT BELOW KNEE AMPUTATION VS. LEFT ABOVE KNEE AMPUTATION;  Surgeon: Nadara Mustard, MD;  Location: [facility] OR;  Service: Orthopedics;  Laterality: Left;   Social History   Occupational History  . Not on file   Tobacco Use  . Smoking status: Current Every Day Smoker    Packs/day: 0.50    Years: 50.00    Pack years: 25.00    Types: Cigarettes  . Smokeless tobacco: Never Used  . Tobacco comment: Less than a pack , "trying to quit"  Substance and Sexual Activity  . Alcohol use: Not Currently  . Drug use: Not Currently  . Sexual activity: Not on file

## 2018-04-03 ENCOUNTER — Other Ambulatory Visit (INDEPENDENT_AMBULATORY_CARE_PROVIDER_SITE_OTHER): Payer: Self-pay | Admitting: Physician Assistant

## 2018-04-03 DIAGNOSIS — Z89612 Acquired absence of left leg above knee: Secondary | ICD-10-CM

## 2018-04-08 ENCOUNTER — Ambulatory Visit (INDEPENDENT_AMBULATORY_CARE_PROVIDER_SITE_OTHER): Payer: PRIVATE HEALTH INSURANCE | Admitting: Internal Medicine

## 2018-04-08 VITALS — Temp 98.2°F

## 2018-04-08 DIAGNOSIS — L089 Local infection of the skin and subcutaneous tissue, unspecified: Secondary | ICD-10-CM | POA: Diagnosis not present

## 2018-04-08 DIAGNOSIS — Z89612 Acquired absence of left leg above knee: Secondary | ICD-10-CM

## 2018-04-08 DIAGNOSIS — T148XXA Other injury of unspecified body region, initial encounter: Secondary | ICD-10-CM

## 2018-04-08 MED ORDER — AMOXICILLIN-POT CLAVULANATE 875-125 MG PO TABS: 1.0000 | ORAL_TABLET | Freq: Two times a day (BID) | ORAL | 0 refills | Status: DC

## 2018-04-08 MED ORDER — DOXYCYCLINE HYCLATE 100 MG PO CAPS: 100.0000 mg | ORAL_CAPSULE | Freq: Two times a day (BID) | ORAL | 1 refills | Status: DC

## 2018-04-08 NOTE — Progress Notes (Signed)
RFV: stump infection  Patient ID: Peter Cochran, male   DOB: 06/25/1956, 62 y.o.   MRN: [id number]  HPI His left AKA is healing steadily. No longer odoferous. On doxy plus amox/clav currently. He was recently seen by dr duda's office this past week and they, too, are pleased with the healing process. He still has poor nutritional status. Taking in 1 ensure/day. He continues to use compression/silver sock to his right lower leg.   Afebrile, not having any diarrhea or signs of thrush.  Outpatient Encounter Medications as of 04/08/2018  Medication Sig  . amoxicillin-clavulanate (AUGMENTIN) 875-125 MG tablet Take 1 tablet by mouth 2 (two) times daily.  Marland Kitchen. aspirin EC 81 MG tablet Take 81 mg by mouth daily.  Marland Kitchen. atorvastatin (LIPITOR) 40 MG tablet Take 1 tablet (40 mg total) by mouth daily.  . carvedilol (COREG) 3.125 MG tablet Take 1 tablet (3.125 mg total) by mouth 2 (two) times daily with a meal.  . clopidogrel (PLAVIX) 75 MG tablet Take 1 tablet (75 mg total) by mouth daily with breakfast.  . doxycycline (VIBRAMYCIN) 100 MG capsule Take 1 capsule (100 mg total) by mouth 2 (two) times daily.  . DULoxetine (CYMBALTA) 30 MG capsule Take 30 mg by mouth at bedtime.   . gabapentin (NEURONTIN) 100 MG capsule Take 1 capsule (100 mg total) by mouth 3 (three) times daily.  Marland Kitchen. levothyroxine (SYNTHROID, LEVOTHROID) 100 MCG tablet Take 100 mcg by mouth daily before breakfast.   . Multiple Vitamin (MULTIVITAMIN WITH MINERALS) TABS tablet Take 1 tablet by mouth daily.  . mupirocin ointment (BACTROBAN) 2 % APPLY TO LEFT STUMP INCISION DAILY  . oxyCODONE-acetaminophen (PERCOCET/ROXICET) 5-325 MG tablet Take 1 tablet by mouth every 4 (four) hours as needed.  . sacubitril-valsartan (ENTRESTO) 24-26 MG Take 1 tablet by mouth 3 (three) times a week.  . spironolactone (ALDACTONE) 25 MG tablet Take 1 tablet (25 mg total) by mouth daily.  . tamsulosin (FLOMAX) 0.4 MG CAPS capsule Take 1 capsule (0.4 mg total) by  mouth daily after breakfast.  . furosemide (LASIX) 20 MG tablet Take 1 tablet (20 mg total) by mouth 3 (three) times a week. On Monday Wednesday friday (Patient taking differently: Take 20 mg by mouth every Monday, Wednesday, and Friday. )   No facility-administered encounter medications on file as of 04/08/2018.      Patient Active Problem List   Diagnosis Date Noted  . Scrotal edema 03/08/2018  . Anemia 03/08/2018  . Chronic hypotension 03/08/2018  . Leukocytosis 03/08/2018  . S/P AKA (above knee amputation) unilateral, left (HCC) 03/07/2018  . Hypothyroidism 03/07/2018  . BKA stump complication (HCC)   . Hx of BKA, left (HCC) 02/28/2018  . Antibiotic long-term use   . AKI (acute kidney injury) (HCC)   . Severe protein-calorie malnutrition (HCC) 12/12/2017  . Bilateral lower leg cellulitis   . Gangrene of toe of left foot (HCC)   . Pressure injury of skin 12/07/2017  . Sepsis due to cellulitis (HCC) 12/06/2017  . Chronic systolic heart failure (HCC) 10/28/2017  . Dilated cardiomyopathy (HCC) 10/27/2017  . Mitral regurgitation 10/27/2017  . Elevated troponin 10/27/2017  . Elevated brain natriuretic peptide (BNP) level 10/27/2017  . PAD (peripheral artery disease) (HCC) 10/27/2017  . 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 Fatigue 10/22/2017     Health Maintenance Due  Topic Date Due  . Hepatitis C Screening  06/25/1956  . TETANUS/TDAP  05/04/1976  .  COLONOSCOPY  05/05/2007  . INFLUENZA VACCINE  02/13/2018     Review of Systems 12 point ros is negative except for what is mentioned in hpi Physical Exam   Temp 98.2 F (36.8 C)   Physical Exam  Constitutional: He is oriented to person, place, and time. He appears well-developed and well-nourished. No distress.  HENT:  Mouth/Throat: Oropharynx is clear and moist. No oropharyngeal exudate.  Cardiovascular: Normal rate, regular rhythm and normal heart sounds. Exam reveals no gallop  and no friction rub.  No murmur heard.  Pulmonary/Chest: Effort normal and breath sounds normal. No respiratory distress. He has no wheezes.  Abdominal: Soft. Bowel sounds are normal. He exhibits no distension. There is no tenderness.  Ext: left AKA - stump has still some eschar to medial aspect of incision with fibrinous debris in the wound bed, but overall improved since we last saw him. Psychiatric: He has a normal mood and affect. His behavior is normal.     CBC Lab Results  Component Value Date   WBC 9.6 03/10/2018   RBC 3.04 (L) 03/10/2018   HGB 9.5 (L) 03/10/2018   HCT 29.4 (L) 03/10/2018   PLT 398 03/10/2018   MCV 96.7 03/10/2018   MCH 31.3 03/10/2018   MCHC 32.3 03/10/2018   RDW 14.7 03/10/2018   LYMPHSABS 694 (L) 02/19/2018   MONOABS 1.2 (H) 12/06/2017   EOSABS 773 (H) 02/19/2018    BMET Lab Results  Component Value Date   NA 137 03/07/2018   K 4.0 03/07/2018   CL 102 03/07/2018   CO2 24 03/07/2018   GLUCOSE 93 03/07/2018   BUN 20 03/07/2018   CREATININE 0.67 03/07/2018   CALCIUM 8.4 (L) 03/07/2018   GFRNONAA >60 03/07/2018   GFRAA >60 03/07/2018      Assessment and Plan Stump infection/non-healing wound - will do 7 more of abtx then watch off of antibiotics - latest blood work suggests leukocytosis of 13K - did mild debridement to help expose healthy tissue wound bed - will see back in 2-3 wk  Spent 25 min with patient

## 2018-04-17 ENCOUNTER — Ambulatory Visit (INDEPENDENT_AMBULATORY_CARE_PROVIDER_SITE_OTHER): Payer: PRIVATE HEALTH INSURANCE | Admitting: Orthopedic Surgery

## 2018-04-17 ENCOUNTER — Encounter (INDEPENDENT_AMBULATORY_CARE_PROVIDER_SITE_OTHER): Payer: Self-pay | Admitting: Orthopedic Surgery

## 2018-04-17 VITALS — Ht 71.0 in | Wt 206.0 lb

## 2018-04-17 DIAGNOSIS — Z89612 Acquired absence of left leg above knee: Secondary | ICD-10-CM

## 2018-04-17 DIAGNOSIS — E43 Unspecified severe protein-calorie malnutrition: Secondary | ICD-10-CM

## 2018-04-17 NOTE — Progress Notes (Signed)
Office Visit Note   Patient: Peter Cochran           Date of Birth: [birth year]           MRN: [id number] Visit Date: 04/17/2018              Requested by: Floria Raveling, FNP [address] [place], [place] [address] PCP: Floria Raveling, FNP  Chief Complaint  Patient presents with  . Left Leg - Routine Post Op    03/07/18 left AKA      HPI: Patient is a 62 year old gentleman who presents 1 week status post left above-the-knee amputation.  Patient did not get a good suction fit with a wound VAC and he was discharged on a regular dressing.  Patient states he has 1 more week of antibiotics.  Assessment & Plan: Visit Diagnoses:  1. 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 Severe protein-calorie malnutrition (HCC)     Plan: Recommend at least stop his Bactroban dressing changes dry dressing change as needed he does have an appointment with [facility] next week to get his prosthetic shrinker.  Follow-Up Instructions: Return in about 2 weeks (around 05/01/2018).   Ortho Exam  Patient is alert, oriented, no adenopathy, well-dressed, normal affect, normal respiratory effort. Examination the wound is well approximated there is no drainage no cellulitis no signs of infection.  Imaging: No results found. No images are attached to the encounter.  Labs: Lab Results  Component Value Date   HGBA1C 6.0 (H) 12/07/2017   ESRSEDRATE 75 (H) 02/19/2018   ESRSEDRATE 46 (H) 01/14/2018   ESRSEDRATE 78 (H) 12/07/2017   CRP 79.4 (H) 02/19/2018   CRP 17.2 (H) 01/14/2018   CRP 13.5 (H) 12/07/2017     Lab Results  Component Value Date   ALBUMIN 2.0 (L) 03/07/2018   ALBUMIN 2.4 (L) 12/07/2017   ALBUMIN 2.8 (L) 12/06/2017   PREALBUMIN 10.8 (L) 12/07/2017    Body mass index is 28.73 kg/m.  Orders:  No orders of the defined types were placed in this encounter.  No orders of the defined types were placed in this encounter.    Procedures: No procedures  performed  Clinical Data: No additional findings.  ROS:  All other systems negative, except as noted in the HPI. Review of Systems  Objective: Vital Signs: Ht 5\' 11"  (1.803 m)   Wt 206 lb (93.4 kg)   BMI 28.73 kg/m   Specialty Comments:  No specialty comments available.  PMFS History: Patient Active Problem List   Diagnosis Date Noted  . Scrotal edema 03/08/2018  . Anemia 03/08/2018  . Chronic hypotension 03/08/2018  . Leukocytosis 03/08/2018  . S/P AKA (above knee amputation) unilateral, left (HCC) 03/07/2018  . Hypothyroidism 03/07/2018  . BKA stump complication (HCC)   . Hx of BKA, left (HCC) 02/28/2018  . Antibiotic long-term use   . AKI (acute kidney injury) (HCC)   . Severe protein-calorie malnutrition (HCC) 12/12/2017  . Bilateral lower leg cellulitis   . Gangrene of toe of left foot (HCC)   . Pressure injury of skin 12/07/2017  . Sepsis due to cellulitis (HCC) 12/06/2017  . Chronic systolic heart failure (HCC) 10/28/2017  . Dilated cardiomyopathy (HCC) 10/27/2017  . Mitral regurgitation 10/27/2017  . Elevated troponin 10/27/2017  . Elevated brain natriuretic peptide (BNP) level 10/27/2017  . PAD (peripheral artery disease) (HCC) 10/27/2017  . 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  Fatigue 10/22/2017   Past Medical History:  Diagnosis Date  . Cellulitis    mild.  L leg  . CHF (congestive heart failure) (HCC)    Dr. Norman Herrlich  . Complication of anesthesia    'hard time waking up"  . Difficulty voiding   . 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  . Enlarged prostate   . Hand fracture, right    Dr. Linda Hedges  . Hypothyroidism (acquired)   . Leg ulcer, left (HCC)   . PAD (peripheral artery disease) (HCC)   . Periorbital hematoma, right   . Peripheral neuropathy   . Toe ulcer (HCC)    Left  . Wound of left leg    Non-healing. Dr. Liston Alba, Dr. Bynum Bellows.    Family History  Problem  Relation Age of Onset  . Hypertension Mother   . Hyperlipidemia Mother   . Heart attack Father   . Diabetes Father   . Hypertension Father   . Hyperlipidemia Father   . Stroke Maternal Grandmother     Past Surgical History:  Procedure Laterality Date  . AMPUTATION Left 02/28/2018   Procedure: LEFT BELOW KNEE AMPUTATION;  Surgeon: Nadara Mustard, MD;  Location: [facility] OR;  Service: Orthopedics;  Laterality: Left;  . AORTOGRAM Left 12/12/2017   Procedure: AORTOGRAM LEFT LOWER EXTREMITY RUNOFF;  Surgeon: Fransisco Hertz, MD;  Location: [facility] OR;  Service: Vascular;  Laterality: Left;  . BELOW KNEE LEG AMPUTATION Left 02/28/2018  . CYST EXCISION    . PERIPHERAL VASCULAR BALLOON ANGIOPLASTY Left 12/12/2017   Procedure: PERIPHERAL VASCULAR BALLOON ANGIOPLASTY LEFT SFA;  Surgeon: Fransisco Hertz, MD;  Location: [facility] OR;  Service: Vascular;  Laterality: Left;  . PILONIDAL CYST DRAINAGE    . STUMP REVISION Left 03/07/2018   Procedure: REVISION LEFT BELOW KNEE AMPUTATION VS. LEFT ABOVE KNEE AMPUTATION;  Surgeon: Nadara Mustard, MD;  Location: [facility] OR;  Service: Orthopedics;  Laterality: Left;   Social History   Occupational History  . Not on file  Tobacco Use  . Smoking status: Current Every Day Smoker    Packs/day: 0.50    Years: 50.00    Pack years: 25.00    Types: Cigarettes  . Smokeless tobacco: Never Used  . Tobacco comment: Less than a pack , "trying to quit"  Substance and Sexual Activity  . Alcohol use: Not Currently  . Drug use: Not Currently  . Sexual activity: Not on file

## 2018-04-25 ENCOUNTER — Encounter: Payer: Self-pay | Admitting: Internal Medicine

## 2018-04-25 ENCOUNTER — Ambulatory Visit (INDEPENDENT_AMBULATORY_CARE_PROVIDER_SITE_OTHER): Payer: PRIVATE HEALTH INSURANCE | Admitting: Internal Medicine

## 2018-04-25 VITALS — BP 108/71 | HR 93 | Temp 97.9°F

## 2018-04-25 DIAGNOSIS — L089 Local infection of the skin and subcutaneous tissue, unspecified: Secondary | ICD-10-CM

## 2018-04-25 DIAGNOSIS — T8789 Other complications of amputation stump: Secondary | ICD-10-CM | POA: Diagnosis not present

## 2018-04-25 DIAGNOSIS — T148XXA Other injury of unspecified body region, initial encounter: Secondary | ICD-10-CM | POA: Diagnosis not present

## 2018-04-25 DIAGNOSIS — T8189XA Other complications of procedures, not elsewhere classified, initial encounter: Secondary | ICD-10-CM

## 2018-04-25 NOTE — Progress Notes (Signed)
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   MRN: [id number]  HPI Peter Cochran is a 63 yo M with hx of aka slow to heal required oral but now improved. Still has sutures in place. Has compression socks in place in anticipation to getting prosthesis for his left leg. No longer having drainage. Finished oral abtx roughly 3 days ago.  Outpatient Encounter Medications as of 04/25/2018  Medication Sig  . amoxicillin-clavulanate (AUGMENTIN) 875-125 MG tablet Take 1 tablet by mouth 2 (two) times daily.  Marland Kitchen aspirin EC 81 MG tablet Take 81 mg by mouth daily.  Marland Kitchen atorvastatin (LIPITOR) 40 MG tablet Take 1 tablet (40 mg total) by mouth daily.  . carvedilol (COREG) 3.125 MG tablet Take 1 tablet (3.125 mg total) by mouth 2 (two) times daily with a meal.  . clopidogrel (PLAVIX) 75 MG tablet Take 1 tablet (75 mg total) by mouth daily with breakfast.  . doxycycline (VIBRAMYCIN) 100 MG capsule Take 1 capsule (100 mg total) by mouth 2 (two) times daily.  . DULoxetine (CYMBALTA) 30 MG capsule Take 30 mg by mouth at bedtime.   . gabapentin (NEURONTIN) 100 MG capsule Take 1 capsule (100 mg total) by mouth 3 (three) times daily.  Marland Kitchen levothyroxine (SYNTHROID, LEVOTHROID) 100 MCG tablet Take 100 mcg by mouth daily before breakfast.   . Multiple Vitamin (MULTIVITAMIN WITH MINERALS) TABS tablet Take 1 tablet by mouth daily.  . mupirocin ointment (BACTROBAN) 2 % APPLY TO LEFT STUMP INCISION DAILY  . oxyCODONE-acetaminophen (PERCOCET/ROXICET) 5-325 MG tablet Take 1 tablet by mouth every 4 (four) hours as needed.  . sacubitril-valsartan (ENTRESTO) 24-26 MG Take 1 tablet by mouth 3 (three) times a week.  . spironolactone (ALDACTONE) 25 MG tablet Take 1 tablet (25 mg total) by mouth daily.  . tamsulosin (FLOMAX) 0.4 MG CAPS capsule Take 1 capsule (0.4 mg total) by mouth daily after breakfast.  . furosemide (LASIX) 20 MG tablet Take 1 tablet  (20 mg total) by mouth 3 (three) times a week. On Monday Wednesday friday (Patient taking differently: Take 20 mg by mouth every Monday, Wednesday, and Friday. )   No facility-administered encounter medications on file as of 04/25/2018.      Patient Active Problem List   Diagnosis Date Noted  . Scrotal edema 03/08/2018  . Anemia 03/08/2018  . Chronic hypotension 03/08/2018  . Leukocytosis 03/08/2018  . S/P AKA (above knee amputation) unilateral, left (HCC) 03/07/2018  . Hypothyroidism 03/07/2018  . BKA stump complication (HCC)   . Hx of BKA, left (HCC) 02/28/2018  . Antibiotic long-term use   . AKI (acute kidney injury) (HCC)   . Severe protein-calorie malnutrition (HCC) 12/12/2017  . Bilateral lower leg cellulitis   . Gangrene of toe of left foot (HCC)   . Pressure injury of skin 12/07/2017  . Sepsis due to cellulitis (HCC) 12/06/2017  . Chronic systolic heart failure (HCC) 10/28/2017  . Dilated cardiomyopathy (HCC) 10/27/2017  . Mitral regurgitation 10/27/2017  . Elevated troponin 10/27/2017  . Elevated brain natriuretic peptide (BNP) level 10/27/2017  . PAD (peripheral artery disease) (HCC) 10/27/2017  . 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 Fatigue 10/22/2017     Health Maintenance Due  Topic Date Due  . Hepatitis C Screening  17-Jul-1955  . TETANUS/TDAP  05/04/1976  . COLONOSCOPY  05/05/2007     Review  of Systems  Physical Exam   BP 108/71   Pulse 93   Temp 97.9 F (36.6 C)    gen = a xo by 4, jovial in good spirits Ext= left aka has dry flaky skin about stump. Sutures intact. No erythema along healed incision line. Medial aspect has crusted yellowish exudate. No pain or fluctuance. CBC Lab Results  Component Value Date   WBC 9.6 03/10/2018   RBC 3.04 (L) 03/10/2018   HGB 9.5 (L) 03/10/2018   HCT 29.4 (L) 03/10/2018   PLT 398 03/10/2018   MCV 96.7 03/10/2018   MCH 31.3 03/10/2018   MCHC 32.3 03/10/2018   RDW  14.7 03/10/2018   LYMPHSABS 694 (L) 02/19/2018   MONOABS 1.2 (H) 12/06/2017   EOSABS 773 (H) 02/19/2018    BMET Lab Results  Component Value Date   NA 137 03/07/2018   K 4.0 03/07/2018   CL 102 03/07/2018   CO2 24 03/07/2018   GLUCOSE 93 03/07/2018   BUN 20 03/07/2018   CREATININE 0.67 03/07/2018   CALCIUM 8.4 (L) 03/07/2018   GFRNONAA >60 03/07/2018   GFRAA >60 03/07/2018      Assessment and Plan  Hx of non healing wound with grangrenous features/osteo s/p amputation = appears well healed no other signs of infection. No need for further abt at this time.  rtc if needed

## 2018-05-01 ENCOUNTER — Encounter (INDEPENDENT_AMBULATORY_CARE_PROVIDER_SITE_OTHER): Payer: Self-pay | Admitting: Orthopedic Surgery

## 2018-05-01 ENCOUNTER — Ambulatory Visit (INDEPENDENT_AMBULATORY_CARE_PROVIDER_SITE_OTHER): Payer: PRIVATE HEALTH INSURANCE | Admitting: Physician Assistant

## 2018-05-01 VITALS — Ht 71.0 in | Wt 206.0 lb

## 2018-05-01 DIAGNOSIS — E43 Unspecified severe protein-calorie malnutrition: Secondary | ICD-10-CM

## 2018-05-01 DIAGNOSIS — Z89612 Acquired absence of left leg above knee: Secondary | ICD-10-CM

## 2018-05-01 DIAGNOSIS — I739 Peripheral vascular disease, unspecified: Secondary | ICD-10-CM

## 2018-05-04 ENCOUNTER — Encounter (INDEPENDENT_AMBULATORY_CARE_PROVIDER_SITE_OTHER): Payer: Self-pay | Admitting: Physician Assistant

## 2018-05-04 MED ORDER — GABAPENTIN 100 MG PO CAPS: 300.0000 mg | ORAL_CAPSULE | Freq: Three times a day (TID) | ORAL | 3 refills | Status: DC

## 2018-05-04 NOTE — Progress Notes (Signed)
Office Visit Note   Patient: Peter Cochran           Date of Birth: [birth year]           MRN: [id number] Visit Date: 05/01/2018              Requested by: Floria Raveling, FNP [address] [place], [place] [address] PCP: Floria Raveling, FNP  Chief Complaint  Patient presents with  . Left Leg - Routine Post Op    BKA left leg      HPI: Patient is a 62 year old male who is seen for postoperative follow-up following revision of his left below the knee amputation to an above-the-knee amputation on 03/07/2018.  He has been following up with Dr. Drue Second of infectious disease, and has completed all of his antibiotic course.  He reports that he is having a lot of phantom sensations over the left lower extremity.  He is wearing his stump shrinker and his edema has markedly improved and he does actually need a smaller stump shrinker now.  He is working with physical therapy on strengthening but thus far has been unable to stand utilizing his right lower extremity and his arms and he is going to need to continue to work on this before he can get his prosthesis.  He is working with [facility] on getting a prosthesis.  He has been wearing a silver compression sock on the right lower extremity and is doing well with this.  Assessment & Plan: Visit Diagnoses:  1. 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 Severe protein-calorie malnutrition (HCC)   3. PAD (peripheral artery disease) (HCC)     Plan: Sutures were harvested from the left above-the-knee amputation this visit.  He will continue to work with [facility] on getting his prosthesis.  He does need to follow-up with them on getting a smaller stump shrinker.  We will increase his gabapentin to 300 mg p.o. 3 times daily to help with his phantom sensations.  He will follow-up here in 4 weeks.  Follow-Up Instructions: No follow-ups on file.   Ortho Exam  Patient is alert, oriented, no adenopathy, well-dressed, normal affect,  normal respiratory effort. The left above-the-knee amputation incisional site is healing well and sutures are will be harvested this visit.  His edema is markedly improved.  There is no signs of cellulitis.  He is sensitive to palpation over the medial side of the stump.  He is wearing a silver compression stocking to the right lower extremity.  Imaging: No results found. No images are attached to the encounter.  Labs: Lab Results  Component Value Date   HGBA1C 6.0 (H) 12/07/2017   ESRSEDRATE 75 (H) 02/19/2018   ESRSEDRATE 46 (H) 01/14/2018   ESRSEDRATE 78 (H) 12/07/2017   CRP 79.4 (H) 02/19/2018   CRP 17.2 (H) 01/14/2018   CRP 13.5 (H) 12/07/2017     Lab Results  Component Value Date   ALBUMIN 2.0 (L) 03/07/2018   ALBUMIN 2.4 (L) 12/07/2017   ALBUMIN 2.8 (L) 12/06/2017   PREALBUMIN 10.8 (L) 12/07/2017    Body mass index is 28.73 kg/m.  Orders:  No orders of the defined types were placed in this encounter.  No orders of the defined types were placed in this encounter.    Procedures: No procedures performed  Clinical Data: No additional findings.  ROS:  All other systems negative, except as noted in the HPI. Review of Systems  Objective: Vital Signs: Ht 5\' 11"  (1.803 m)   Wt 206 lb (93.4 kg)   BMI 28.73 kg/m   Specialty Comments:  No specialty comments available.  PMFS History: Patient Active Problem List   Diagnosis Date Noted  . Scrotal edema 03/08/2018  . Anemia 03/08/2018  . Chronic hypotension 03/08/2018  . Leukocytosis 03/08/2018  . S/P AKA (above knee amputation) unilateral, left (HCC) 03/07/2018  . Hypothyroidism 03/07/2018  . BKA stump complication (HCC)   . Hx of BKA, left (HCC) 02/28/2018  . Antibiotic long-term use   . AKI (acute kidney injury) (HCC)   . Severe protein-calorie malnutrition (HCC) 12/12/2017  . Bilateral lower leg cellulitis   . Gangrene of toe of left foot (HCC)   . Pressure injury of skin 12/07/2017  . Sepsis due to  cellulitis (HCC) 12/06/2017  . Chronic systolic heart failure (HCC) 10/28/2017  . Dilated cardiomyopathy (HCC) 10/27/2017  . Mitral regurgitation 10/27/2017  . Elevated troponin 10/27/2017  . Elevated brain natriuretic peptide (BNP) level 10/27/2017  . PAD (peripheral artery disease) (HCC) 10/27/2017  . 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 Fatigue 10/22/2017   Past Medical History:  Diagnosis Date  . Cellulitis    mild.  L leg  . CHF (congestive heart failure) (HCC)    Dr. Norman Herrlich  . Complication of anesthesia    'hard time waking up"  . Difficulty voiding   . 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  . Enlarged prostate   . Hand fracture, right    Dr. Linda Hedges  . Hypothyroidism (acquired)   . Leg ulcer, left (HCC)   . PAD (peripheral artery disease) (HCC)   . Periorbital hematoma, right   . Peripheral neuropathy   . Toe ulcer (HCC)    Left  . Wound of left leg    Non-healing. Dr. Liston Alba, Dr. Bynum Bellows.    Family History  Problem Relation Age of Onset  . Hypertension Mother   . Hyperlipidemia Mother   . Heart attack Father   . Diabetes Father   . Hypertension Father   . Hyperlipidemia Father   . Stroke Maternal Grandmother     Past Surgical History:  Procedure Laterality Date  . AMPUTATION Left 02/28/2018   Procedure: LEFT BELOW KNEE AMPUTATION;  Surgeon: Nadara Mustard, MD;  Location: [facility] OR;  Service: Orthopedics;  Laterality: Left;  . AORTOGRAM Left 12/12/2017   Procedure: AORTOGRAM LEFT LOWER EXTREMITY RUNOFF;  Surgeon: Fransisco Hertz, MD;  Location: [facility] OR;  Service: Vascular;  Laterality: Left;  . BELOW KNEE LEG AMPUTATION Left 02/28/2018  . CYST EXCISION    . PERIPHERAL VASCULAR BALLOON ANGIOPLASTY Left 12/12/2017   Procedure: PERIPHERAL VASCULAR BALLOON ANGIOPLASTY LEFT SFA;  Surgeon: Fransisco Hertz, MD;  Location: [facility] OR;  Service: Vascular;  Laterality: Left;  . PILONIDAL CYST DRAINAGE    .  STUMP REVISION Left 03/07/2018   Procedure: REVISION LEFT BELOW KNEE AMPUTATION VS. LEFT ABOVE KNEE AMPUTATION;  Surgeon: Nadara Mustard, MD;  Location: [facility] OR;  Service: Orthopedics;  Laterality: Left;   Social History   Occupational History  . Not on file  Tobacco Use  . Smoking status: Current Every Day Smoker    Packs/day: 0.50    Years: 50.00    Pack years: 25.00    Types: Cigarettes  . Smokeless tobacco: Never Used  . Tobacco comment: Less than a pack , "trying to  quit"  Substance and Sexual Activity  . Alcohol use: Not Currently  . Drug use: Not Currently  . Sexual activity: Not on file

## 2018-05-19 ENCOUNTER — Other Ambulatory Visit: Payer: Self-pay | Admitting: Cardiology

## 2018-05-28 ENCOUNTER — Ambulatory Visit (INDEPENDENT_AMBULATORY_CARE_PROVIDER_SITE_OTHER): Payer: PRIVATE HEALTH INSURANCE | Admitting: Orthopedic Surgery

## 2018-05-28 ENCOUNTER — Encounter (INDEPENDENT_AMBULATORY_CARE_PROVIDER_SITE_OTHER): Payer: Self-pay | Admitting: Orthopedic Surgery

## 2018-05-28 VITALS — Ht 71.0 in | Wt 206.0 lb

## 2018-05-28 DIAGNOSIS — Z89612 Acquired absence of left leg above knee: Secondary | ICD-10-CM

## 2018-06-02 ENCOUNTER — Encounter (INDEPENDENT_AMBULATORY_CARE_PROVIDER_SITE_OTHER): Payer: Self-pay | Admitting: Orthopedic Surgery

## 2018-06-02 NOTE — Progress Notes (Signed)
Office Visit Note   Patient: Peter SimonsRobert T Dominey           Date of Birth: September 28, 1955           MRN: [id number] Visit Date: 05/28/2018              Requested by: Floria RavelingWilson, Ashley H, FNP [address] [place], [place] [id number] PCP: Floria RavelingWilson, Ashley H, FNP  Chief Complaint  Patient presents with  . Left Leg - Routine Post Op    Revision Left AKA      HPI: Patient is a 62 year old gentleman who presents 11 weeks status post revision left above-the-knee amputation.  Patient states [facility] [place] [facility] [place].  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  Assessment & Plan: Visit Diagnoses:  1. 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  Follow-Up Instructions: Return in about 2 months (around 07/28/2018).   Ortho Exam  Patient is alert, oriented, no adenopathy, well-dressed, normal affect, normal respiratory effort. Examination the surgical incision is healing well there is a small scabbed area on the residual limb there is no drainage no cellulitis no signs of infection.  Patient's last albumin was 2.0 and discussed the importance of nutrition protein supplements.  Imaging: No results found. No images are attached to the encounter.  Labs: Lab Results  Component Value Date   HGBA1C 6.0 (H) 12/07/2017   ESRSEDRATE 75 (H) 02/19/2018   ESRSEDRATE 46 (H) 01/14/2018   ESRSEDRATE 78 (H) 12/07/2017   CRP 79.4 (H) 02/19/2018   CRP 17.2 (H) 01/14/2018   CRP 13.5 (H) 12/07/2017     Lab Results  Component Value Date   ALBUMIN 2.0 (L) 03/07/2018   ALBUMIN 2.4 (L) 12/07/2017   ALBUMIN 2.8 (L) 12/06/2017   PREALBUMIN 10.8 (L) 12/07/2017    Body mass index is 28.73 kg/m.  Orders:  No orders of the defined types were placed in this encounter.  No orders of the defined types were placed in this encounter.    Procedures: No  procedures performed  Clinical Data: No additional findings.  ROS:  All other systems negative, except as noted in the HPI. Review of Systems  Objective: Vital Signs: Ht 5\' 11"  (1.803 m)   Wt 206 lb (93.4 kg)   BMI 28.73 kg/m   Specialty Comments:  No specialty comments available.  PMFS History: Patient Active Problem List   Diagnosis Date Noted  . Scrotal edema 03/08/2018  . Anemia 03/08/2018  . Chronic hypotension 03/08/2018  . Leukocytosis 03/08/2018  . S/P AKA (above knee amputation) unilateral, left (HCC) 03/07/2018  . Hypothyroidism 03/07/2018  . BKA stump complication (HCC)   . Hx of BKA, left (HCC) 02/28/2018  . Antibiotic long-term use   . AKI (acute kidney injury) (HCC)   . Severe protein-calorie malnutrition (HCC) 12/12/2017  . Bilateral lower leg cellulitis   . Gangrene of toe of left foot (HCC)   . Pressure injury of skin 12/07/2017  . Sepsis due to cellulitis (HCC) 12/06/2017  . Chronic systolic heart failure (HCC) 10/28/2017  . Dilated cardiomyopathy (HCC) 10/27/2017  . Mitral regurgitation 10/27/2017  . Elevated troponin 10/27/2017  . Elevated brain natriuretic peptide (BNP) level 10/27/2017  . PAD (peripheral artery disease) (HCC) 10/27/2017  . 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 Fatigue  10/22/2017   Past Medical History:  Diagnosis Date  . Cellulitis    mild.  L leg  . CHF (congestive heart failure) (HCC)    Dr. Norman Herrlich  . Complication of anesthesia    'hard time waking up"  . Difficulty voiding   . 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  . Enlarged prostate   . Hand fracture, right    Dr. Linda Hedges  . Hypothyroidism (acquired)   . Leg ulcer, left (HCC)   . PAD (peripheral artery disease) (HCC)   . Periorbital hematoma, right   . Peripheral neuropathy   . Toe ulcer (HCC)    Left  . Wound of left leg    Non-healing. Dr. Liston Alba, Dr. Bynum Bellows.    Family History    Problem Relation Age of Onset  . Hypertension Mother   . Hyperlipidemia Mother   . Heart attack Father   . Diabetes Father   . Hypertension Father   . Hyperlipidemia Father   . Stroke Maternal Grandmother     Past Surgical History:  Procedure Laterality Date  . AMPUTATION Left 02/28/2018   Procedure: LEFT BELOW KNEE AMPUTATION;  Surgeon: Nadara Mustard, MD;  Location: [facility] OR;  Service: Orthopedics;  Laterality: Left;  . AORTOGRAM Left 12/12/2017   Procedure: AORTOGRAM LEFT LOWER EXTREMITY RUNOFF;  Surgeon: Fransisco Hertz, MD;  Location[facility] OR;  Service: Vascular;  Laterality: Left;  . BELOW KNEE LEG AMPUTATION Left 02/28/2018  . CYST EXCISION    . PERIPHERAL VASCULAR BALLOON ANGIOPLASTY Left 12/12/2017   Procedure: PERIPHERAL VASCULAR BALLOON ANGIOPLASTY LEFT SFA;  Surgeon: Fransisco Hertz, MD;  Location[facility] OR;  Service: Vascular;  Laterality: Left;  . PILONIDAL CYST DRAINAGE    . STUMP REVISION Left 03/07/2018   Procedure: REVISION LEFT BELOW KNEE AMPUTATION VS. LEFT ABOVE KNEE AMPUTATION;  Surgeon: Nadara Mustard, MD;  Location: [facility] OR;  Service: Orthopedics;  Laterality: Left;   Social History   Occupational History  . Not on file  Tobacco Use  . Smoking status: Current Every Day Smoker    Packs/day: 0.50    Years: 50.00    Pack years: 25.00    Types: Cigarettes  . Smokeless tobacco: Never Used  . Tobacco comment: Less than a pack , "trying to quit"  Substance and Sexual Activity  . Alcohol use: Not Currently  . Drug use: Not Currently  . Sexual activity: Not on file

## 2018-07-28 ENCOUNTER — Ambulatory Visit (INDEPENDENT_AMBULATORY_CARE_PROVIDER_SITE_OTHER): Payer: PRIVATE HEALTH INSURANCE | Admitting: Orthopedic Surgery

## 2018-07-28 NOTE — Progress Notes (Signed)
Cardiology Office Note:    Date:  07/29/2018   ID:  Peter Cochran, DOB 10/19/1955, MRN [id number]  PCP:  Floria RavelingWilson, Ashley H, FNP  Cardiologist:  Norman HerrlichBrian Esmay Amspacher, MD    Referring MD: Floria RavelingWilson, Ashley H, FNP    ASSESSMENT:    1. Chronic systolic heart failure (HCC)   2. Dilated cardiomyopathy (HCC)   3. PAD (peripheral artery disease) (HCC)    PLAN:    In order of problems listed above:  1. He has severe heart failure apparently asymptomatic but he is very limited after his left lower extremity amputation he has mild fluid overload will resume a minimum dose of diuretic Entresto pending on renal function potassium and blood pressure consider MRA 2. See discussion above recheck echocardiogram begin to think about the role of ICD therapy as it appears he is going to survive his peripheral vascular disease and his heart failure is relatively compensators 3. Stable improved he is healed wounds from his left lower extremity amputation 4. Stable hyperlipidemia continue with statin    Next appointment: 6 weeks   Medication Adjustments/Labs and Tests Ordered: Current medicines are reviewed at length with the patient today.  Concerns regarding medicines are outlined above.  No orders of the defined types were placed in this encounter.  No orders of the defined types were placed in this encounter.   Chief Complaint  Patient presents with  . Follow-up  . Congestive Heart Failure    History of Present Illness:    Peter Cochran is a 63 y.o. male with a hx of systolic heart failure and PAD with left AKA last seen 12/25/17. Compliance with diet, lifestyle and medications: Yes he has been out of his diuretic for a week and is been off Entresto since November he was unable to get refills of prescriptions.  He has mild edema of the right lower extremities around left amputation of the thigh but is not short of breath no chest pain palpitation or syncope I raise the issue of ICD therapy with  increasing previous EF of 17% resume a minimum dose of diuretic restarted minimum dose of Entresto continue beta-blocker and reassess in the office in 6 weeks if EF remains severely reduced. Suspect there is at strongly consider device therapy with or without coronary angiography first he had a myocardial perfusion study in May 2019 with extensive fixed defect no ischemia he has finished care with the infectious disease people his wounds are healed is off antibiotics.  He raised the issue of elective hernia surgery and I strongly discouraged Past Medical History:  Diagnosis Date  . Cellulitis    mild.  L leg  . CHF (congestive heart failure) (HCC)    Dr. Norman HerrlichBrian Lanique Gonzalo  . Complication of anesthesia    'hard time waking up"  . Difficulty voiding   . 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  . Enlarged prostate   . Hand fracture, right    Dr. Linda HedgesJeffrey Yaste  . Hypothyroidism (acquired)   . Leg ulcer, left (HCC)   . PAD (peripheral artery disease) (HCC)   . Periorbital hematoma, right   . Peripheral neuropathy   . Toe ulcer (HCC)    Left  . Wound of left leg    Non-healing. Dr. Liston AlbaPedro Hernandez, Dr. Bynum Bellowsitorya Strover.    Past Surgical History:  Procedure Laterality Date  . AMPUTATION Left 02/28/2018   Procedure: LEFT BELOW KNEE AMPUTATION;  Surgeon: Nadara Mustarduda, Marcus V, MD;  [facility];  Service: Orthopedics;  Laterality: Left;  . AORTOGRAM Left 12/12/2017   Procedure: AORTOGRAM LEFT LOWER EXTREMITY RUNOFF;  Surgeon: Fransisco Hertzhen, Nicholas Trompeter L, MD;  [facility];  Service: Vascular;  Laterality: Left;  . BELOW KNEE LEG AMPUTATION Left 02/28/2018  . CYST EXCISION    . PERIPHERAL VASCULAR BALLOON ANGIOPLASTY Left 12/12/2017   Procedure: PERIPHERAL VASCULAR BALLOON ANGIOPLASTY LEFT SFA;  Surgeon: Fransisco Hertzhen, Debbie Bellucci L, MD;  [facility];  Service: Vascular;  Laterality: Left;  . PILONIDAL CYST DRAINAGE    . STUMP REVISION Left 03/07/2018   Procedure: REVISION LEFT BELOW KNEE AMPUTATION VS. LEFT ABOVE KNEE  AMPUTATION;  Surgeon: Nadara Mustarduda, Marcus V, MD;  Location: [facility] OR;  Service: Orthopedics;  Laterality: Left;    Current Medications: Current Meds  Medication Sig  . aspirin EC 81 MG tablet Take 81 mg by mouth daily.  Marland Kitchen. atorvastatin (LIPITOR) 40 MG tablet Take 1 tablet (40 mg total) by mouth daily.  . bethanechol (URECHOLINE) 25 MG tablet Take 50 mg by mouth 3 (three) times daily.  . carvedilol (COREG) 3.125 MG tablet Take 1 tablet (3.125 mg total) by mouth 2 (two) times daily with a meal.  . clopidogrel (PLAVIX) 75 MG tablet Take 1 tablet (75 mg total) by mouth daily with breakfast.  . DULoxetine (CYMBALTA) 30 MG capsule Take 30 mg by mouth at bedtime.   . gabapentin (NEURONTIN) 100 MG capsule Take 3 capsules (300 mg total) by mouth 3 (three) times daily.  Marland Kitchen. levothyroxine (SYNTHROID, LEVOTHROID) 125 MCG tablet Take 125 mcg by mouth daily before breakfast.  . Multiple Vitamin (MULTIVITAMIN WITH MINERALS) TABS tablet Take 1 tablet by mouth daily.  Marland Kitchen. spironolactone (ALDACTONE) 25 MG tablet Take 1 tablet (25 mg total) by mouth daily.  . tamsulosin (FLOMAX) 0.4 MG CAPS capsule Take 1 capsule (0.4 mg total) by mouth daily after breakfast.  . traMADol (ULTRAM) 50 MG tablet Take 1 tablet by mouth every 4 (four) hours as needed.     Allergies:   Oxytetracycline and Sulfa antibiotics   Social History   Socioeconomic History  . Marital status: Married    Spouse name: Not on file  . Number of children: Not on file  . Years of education: Not on file  . Highest education level: Not on file  Occupational History  . Not on file  Social Needs  . Financial resource strain: Not on file  . Food insecurity:    Worry: Not on file    Inability: Not on file  . Transportation needs:    Medical: Not on file    Non-medical: Not on file  Tobacco Use  . Smoking status: Current Every Day Smoker    Packs/day: 0.50    Years: 50.00    Pack years: 25.00    Types: Cigarettes  . Smokeless tobacco: Never Used  .  Tobacco comment: Less than a pack , "trying to quit"  Substance and Sexual Activity  . Alcohol use: Not Currently  . Drug use: Not Currently  . Sexual activity: Not on file  Lifestyle  . Physical activity:    Days per week: Not on file    Minutes per session: Not on file  . Stress: Not on file  Relationships  . Social connections:    Talks on phone: Not on file    Gets together: Not on file    Attends religious service: Not on file    Active member of club or organization: Not on file  Attends meetings of clubs or organizations: Not on file    Relationship status: Not on file  Other Topics Concern  . Not on file  Social History Narrative  . Not on file     Family History: The patient's family history includes Diabetes in his father; Heart attack in his father; Hyperlipidemia in his father and mother; Hypertension in his father and mother; Stroke in his maternal grandmother. ROS:   Please see the history of present illness.    All other systems reviewed and are negative.  EKGs/Labs/Other Studies Reviewed:    The following studies were reviewed today:   Recent Labs: 12/08/2017: B Natriuretic Peptide 321.2 12/15/2017: Magnesium 1.8; TSH 7.599 03/07/2018: ALT 143; BUN 20; Creatinine, Ser 0.67; Potassium 4.0; Sodium 137 03/10/2018: Hemoglobin 9.5; Platelets 398  Recent Lipid Panel No results found for: CHOL, TRIG, HDL, CHOLHDL, VLDL, LDLCALC, LDLDIRECT  Physical Exam:    VS:  BP (!) 92/58 (BP Location: Right Arm, Patient Position: Sitting, Cuff Size: Large)   Pulse 88   SpO2 95%     Wt Readings from Last 3 Encounters:  05/28/18 206 lb (93.4 kg)  05/01/18 206 lb (93.4 kg)  04/17/18 206 lb (93.4 kg)     GEN:  Well nourished, well developed in no acute distress HEENT: Normal NECK: No JVD; No carotid bruits LYMPHATICS: No lymphadenopathy CARDIAC: RRR, no murmurs, rubs, gallops RESPIRATORY:  Clear to auscultation without rales, wheezing or rhonchi  ABDOMEN: Soft,  non-tender, non-distended MUSCULOSKELETAL: 1+ RLE edema edema; No deformity  SKIN: Warm and dry NEUROLOGIC:  Alert and oriented x 3 PSYCHIATRIC:  Normal affect    Signed, Norman Herrlich, MD  07/29/2018 9:41 AM    [facility]

## 2018-07-29 ENCOUNTER — Encounter: Payer: Self-pay | Admitting: Cardiology

## 2018-07-29 ENCOUNTER — Ambulatory Visit: Payer: PRIVATE HEALTH INSURANCE | Admitting: Cardiology

## 2018-07-29 VITALS — BP 92/58 | HR 88

## 2018-07-29 DIAGNOSIS — I5022 Chronic systolic (congestive) heart failure: Secondary | ICD-10-CM | POA: Diagnosis not present

## 2018-07-29 DIAGNOSIS — I420 Dilated cardiomyopathy: Secondary | ICD-10-CM

## 2018-07-29 DIAGNOSIS — I739 Peripheral vascular disease, unspecified: Secondary | ICD-10-CM

## 2018-07-29 MED ORDER — FUROSEMIDE 20 MG PO TABS: 20.0000 mg | ORAL_TABLET | ORAL | 0 refills | Status: DC

## 2018-07-29 MED ORDER — SACUBITRIL-VALSARTAN 24-26 MG PO TABS: 1.0000 | ORAL_TABLET | ORAL | 0 refills | Status: DC

## 2018-07-29 NOTE — Patient Instructions (Signed)
Medication Instructions:  Your physician has recommended you make the following change in your medication:   RESTART furosemide (lasix) 20 mg: Take 1 tablet daily on Monday, Wednesday, Friday RESTART sacubitril-valsartan (entresto) 24-26 mg: Take 1 tablet daily on Monday, Wednesday, Friday   If you need a refill on your cardiac medications before your next appointment, please call your pharmacy.   Lab work: None  If you have labs (blood work) drawn today and your tests are completely normal, you will receive your results only by: Marland Kitchen MyChart Message (if you have MyChart) OR . A paper copy in the mail If you have any lab test that is abnormal or we need to change your treatment, we will call you to review the results.  Testing/Procedures: Your physician has requested that you have an echocardiogram. Echocardiography is a painless test that uses sound waves to create images of your heart. It provides your doctor with information about the size and shape of your heart and how well your heart's chambers and valves are working. This procedure takes approximately one hour. There are no restrictions for this procedure.  Follow-Up: At [facility], you and your health needs are our priority.  As part of our continuing mission to provide you with exceptional heart care, we have created designated Provider Care Teams.  These Care Teams include your primary Cardiologist (physician) and Advanced Practice Providers (APPs -  Physician Assistants and Nurse Practitioners) who all work together to provide you with the care you need, when you need it. You will need a follow up appointment in 6 weeks.        Echocardiogram An echocardiogram is a procedure that uses painless sound waves (ultrasound) to produce an image of the heart. Images from an echocardiogram can provide important information about:  Signs of coronary artery disease (CAD).  Aneurysm detection. An aneurysm is a weak or damaged part of  an artery wall that bulges out from the normal force of blood pumping through the body.  Heart size and shape. Changes in the size or shape of the heart can be associated with certain conditions, including heart failure, aneurysm, and CAD.  Heart muscle function.  Heart valve function.  Signs of a past heart attack.  Fluid buildup around the heart.  Thickening of the heart muscle.  A tumor or infectious growth around the heart valves. Tell a health care provider about:  Any allergies you have.  All medicines you are taking, including vitamins, herbs, eye drops, creams, and over-the-counter medicines.  Any blood disorders you have.  Any surgeries you have had.  Any medical conditions you have.  Whether you are pregnant or may be pregnant. What are the risks? Generally, this is a safe procedure. However, problems may occur, including:  Allergic reaction to dye (contrast) that may be used during the procedure. What happens before the procedure? No specific preparation is needed. You may eat and drink normally. What happens during the procedure?   An IV tube may be inserted into one of your veins.  You may receive contrast through this tube. A contrast is an injection that improves the quality of the pictures from your heart.  A gel will be applied to your chest.  A wand-like tool (transducer) will be moved over your chest. The gel will help to transmit the sound waves from the transducer.  The sound waves will harmlessly bounce off of your heart to allow the heart images to be captured in real-time motion. The images  will be recorded on a computer. The procedure may vary among health care providers and hospitals. What happens after the procedure?  You may return to your normal, everyday life, including diet, activities, and medicines, unless your health care provider tells you not to do that. Summary  An echocardiogram is a procedure that uses painless sound waves  (ultrasound) to produce an image of the heart.  Images from an echocardiogram can provide important information about the size and shape of your heart, heart muscle function, heart valve function, and fluid buildup around your heart.  You do not need to do anything to prepare before this procedure. You may eat and drink normally.  After the echocardiogram is completed, you may return to your normal, everyday life, unless your health care provider tells you not to do that. This information is not intended to replace advice given to you by your health care provider. Make sure you discuss any questions you have with your health care provider. Document Released: 06/29/2000 Document Revised: 08/04/2016 Document Reviewed: 08/04/2016 Elsevier Interactive Patient Education  2019 [employer]

## 2018-07-29 NOTE — Addendum Note (Signed)
Addended by: Crist Fat on: 07/29/2018 10:00 AM   Modules accepted: Orders

## 2018-07-31 ENCOUNTER — Encounter: Payer: Self-pay | Admitting: Cardiology

## 2018-07-31 ENCOUNTER — Other Ambulatory Visit (INDEPENDENT_AMBULATORY_CARE_PROVIDER_SITE_OTHER): Payer: Self-pay | Admitting: Physician Assistant

## 2018-09-02 ENCOUNTER — Telehealth (INDEPENDENT_AMBULATORY_CARE_PROVIDER_SITE_OTHER): Payer: Self-pay | Admitting: Orthopedic Surgery

## 2018-09-02 NOTE — Telephone Encounter (Signed)
Pt case manager called asking if we can re start physical therapy for the pt.  [place] --[phone] ext [phone]

## 2018-09-02 NOTE — Telephone Encounter (Signed)
Will hold message.

## 2018-09-04 ENCOUNTER — Encounter (INDEPENDENT_AMBULATORY_CARE_PROVIDER_SITE_OTHER): Payer: Self-pay | Admitting: Orthopedic Surgery

## 2018-09-04 ENCOUNTER — Ambulatory Visit (INDEPENDENT_AMBULATORY_CARE_PROVIDER_SITE_OTHER): Payer: PRIVATE HEALTH INSURANCE | Admitting: Orthopedic Surgery

## 2018-09-04 VITALS — Ht 71.0 in | Wt 206.0 lb

## 2018-09-04 DIAGNOSIS — E43 Unspecified severe protein-calorie malnutrition: Secondary | ICD-10-CM

## 2018-09-04 DIAGNOSIS — Z89612 Acquired absence of left leg above knee: Secondary | ICD-10-CM | POA: Diagnosis not present

## 2018-09-04 DIAGNOSIS — I87321 Chronic venous hypertension (idiopathic) with inflammation of right lower extremity: Secondary | ICD-10-CM

## 2018-09-04 NOTE — Progress Notes (Addendum)
Office Visit Note   Patient: Peter Cochran           Date of Birth: 14-Dec-1955           MRN: [id number] Visit Date: 09/04/2018              Requested by: Floria Raveling, FNP [address] [place], [place] [address] PCP: Floria Raveling, FNP  Chief Complaint  Patient presents with  . Left Leg - Routine Post Op    03/07/18 left AKA  . Right Leg - Routine Post Op      HPI: Patient is a 63 year old gentleman who presents in follow-up for 2 separate issues #1 left above-the-knee amputation.  Patient states he has some tenderness medially but states the incision is well-healed.  Patient also is concerned about the right lower extremity with his venous insufficiency he is currently wearing a compression stocking.  Assessment & Plan: Visit Diagnoses:  1. 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 Severe protein-calorie malnutrition (HCC)   3. 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  Proper application of the stump shrinker was demonstrated for the left above-the-knee amputation the stump shrinker has been rolling down and causing a rash on the posterior aspect of his thigh.  Patient is not allergic to the dye or the wall.  Follow-Up Instructions: Return in about 2 months (around 11/03/2018).   Ortho Exam  Patient is alert, oriented, no adenopathy, well-dressed, normal affect, normal respiratory effort. Examination patient has a well-healed left above-the-knee amputation.  There is a rash on the posterior aspect of the thigh where the stump shrinker is rolled down proper application was demonstrated.  Examination of the right lower extremity he has venous insufficiency dermatitis but no open ulcers no drainage no cellulitis.  On examination patient has developed a flexion contracture to the right knee lacks  20 degrees to full extension.  Patient has weakness in both upper extremities he cannot lift himself off his wheelchair.  He was given a prescription for outpatient physical therapy for upper extremity strengthening and working on extension for the right knee.  He will need to be able to lift himself off the chair in order to be able to utilize a prosthesis on the left.  Imaging: No results found. No images are attached to the encounter.  Labs: Lab Results  Component Value Date   HGBA1C 6.0 (H) 12/07/2017   ESRSEDRATE 75 (H) 02/19/2018   ESRSEDRATE 46 (H) 01/14/2018   ESRSEDRATE 78 (H) 12/07/2017   CRP 79.4 (H) 02/19/2018   CRP 17.2 (H) 01/14/2018   CRP 13.5 (H) 12/07/2017     Lab Results  Component Value Date   ALBUMIN 2.0 (L) 03/07/2018   ALBUMIN 2.4 (L) 12/07/2017   ALBUMIN 2.8 (L) 12/06/2017   PREALBUMIN 10.8 (L) 12/07/2017    Body mass index is 28.73 kg/m.  Orders:  No orders of the defined types were placed in this encounter.  No orders of the defined types were placed in this encounter.    Procedures: No procedures performed  Clinical Data: No additional findings.  ROS:  All other systems negative, except as noted in the HPI. Review of Systems  Objective: Vital Signs: Ht 5\' 11"  (1.803 m)   Wt 206 lb (93.4 kg)   BMI  28.73 kg/m   Specialty Comments:  No specialty comments available.  PMFS History: Patient Active Problem List   Diagnosis Date Noted  . Scrotal edema 03/08/2018  . Anemia 03/08/2018  . Chronic hypotension 03/08/2018  . Leukocytosis 03/08/2018  . S/P AKA (above knee amputation) unilateral, left (HCC) 03/07/2018  . Hypothyroidism 03/07/2018  . BKA stump complication (HCC)   . Hx of BKA, left (HCC) 02/28/2018  . Antibiotic long-term use   . AKI (acute kidney injury) (HCC)   . Severe protein-calorie malnutrition (HCC) 12/12/2017  . Bilateral lower leg cellulitis   . Gangrene of toe of left foot (HCC)   . Pressure injury of skin  12/07/2017  . Sepsis due to cellulitis (HCC) 12/06/2017  . Chronic systolic heart failure (HCC) 10/28/2017  . Dilated cardiomyopathy (HCC) 10/27/2017  . Mitral regurgitation 10/27/2017  . Elevated troponin 10/27/2017  . Elevated brain natriuretic peptide (BNP) level 10/27/2017  . PAD (peripheral artery disease) (HCC) 10/27/2017  . 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 Fatigue 10/22/2017   Past Medical History:  Diagnosis Date  . Cellulitis    mild.  L leg  . CHF (congestive heart failure) (HCC)    Dr. Norman Herrlich  . Complication of anesthesia    'hard time waking up"  . Difficulty voiding   . 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  . Enlarged prostate   . Hand fracture, right    Dr. Linda Hedges  . Hypothyroidism (acquired)   . Leg ulcer, left (HCC)   . PAD (peripheral artery disease) (HCC)   . Periorbital hematoma, right   . Peripheral neuropathy   . Toe ulcer (HCC)    Left  . Wound of left leg    Non-healing. Dr. Liston Alba, Dr. Bynum Bellows.    Family History  Problem Relation Age of Onset  . Hypertension Mother   . Hyperlipidemia Mother   . Heart attack Father   . Diabetes Father   . Hypertension Father   . Hyperlipidemia Father   . Stroke Maternal Grandmother     Past Surgical History:  Procedure Laterality Date  . AMPUTATION Left 02/28/2018   Procedure: LEFT BELOW KNEE AMPUTATION;  Surgeon: Nadara Mustard, MD;  Location: [facility] OR;  Service: Orthopedics;  Laterality: Left;  . AORTOGRAM Left 12/12/2017   Procedure: AORTOGRAM LEFT LOWER EXTREMITY RUNOFF;  Surgeon: Fransisco Hertz, MD;  Location: [facility] OR;  Service: Vascular;  Laterality: Left;  . BELOW KNEE LEG AMPUTATION Left 02/28/2018  . CYST EXCISION    . PERIPHERAL VASCULAR BALLOON ANGIOPLASTY Left 12/12/2017   Procedure: PERIPHERAL VASCULAR BALLOON ANGIOPLASTY LEFT SFA;  Surgeon: Fransisco Hertz, MD;  Location: [facility] OR;  Service: Vascular;  Laterality: Left;  .  PILONIDAL CYST DRAINAGE    . STUMP REVISION Left 03/07/2018   Procedure: REVISION LEFT BELOW KNEE AMPUTATION VS. LEFT ABOVE KNEE AMPUTATION;  Surgeon: Nadara Mustard, MD;  Location: [facility] OR;  Service: Orthopedics;  Laterality: Left;   Social History   Occupational History  . Not on file  Tobacco Use  . Smoking status: Current Every Day Smoker    Packs/day: 0.50    Years: 50.00    Pack years: 25.00    Types: Cigarettes  . Smokeless tobacco: Never Used  . Tobacco comment: Less than a pack , "trying to quit"  Substance and Sexual Activity  . Alcohol use: Not Currently  . Drug use: Not Currently  .  Sexual activity: Not on file

## 2018-09-05 ENCOUNTER — Telehealth (INDEPENDENT_AMBULATORY_CARE_PROVIDER_SITE_OTHER): Payer: Self-pay

## 2018-09-05 NOTE — Telephone Encounter (Signed)
Pt was given a prescription for outpatient physical therapy for upper extremity strengthening and working on extension for the right knee.

## 2018-09-05 NOTE — Telephone Encounter (Signed)
Pt was given a prescription for outpatient physical therapy for upper extremity strengthening and working on extension for the right knee. 

## 2018-09-09 ENCOUNTER — Ambulatory Visit (INDEPENDENT_AMBULATORY_CARE_PROVIDER_SITE_OTHER): Payer: PRIVATE HEALTH INSURANCE

## 2018-09-09 DIAGNOSIS — I5022 Chronic systolic (congestive) heart failure: Secondary | ICD-10-CM | POA: Diagnosis not present

## 2018-09-09 DIAGNOSIS — I420 Dilated cardiomyopathy: Secondary | ICD-10-CM

## 2018-09-09 NOTE — Progress Notes (Signed)
Complete echocardiogram has been performed.  Jimmy Jyden Kromer RDCS, RVT 

## 2018-09-10 ENCOUNTER — Telehealth (INDEPENDENT_AMBULATORY_CARE_PROVIDER_SITE_OTHER): Payer: Self-pay | Admitting: Orthopedic Surgery

## 2018-09-10 NOTE — Telephone Encounter (Signed)
Faxed received completed. Will have Dr. Lajoyce Corners sign and fax tomorrow.

## 2018-09-10 NOTE — Telephone Encounter (Signed)
Vivan from [facility] called to get a referral for this patient to receive [facility].  They are needing the demographic information with the insurance, office notes, medication list.  [phone] and fax is [phone].  Thank you.

## 2018-09-11 ENCOUNTER — Ambulatory Visit: Payer: PRIVATE HEALTH INSURANCE | Admitting: Cardiology

## 2018-09-12 ENCOUNTER — Telehealth (INDEPENDENT_AMBULATORY_CARE_PROVIDER_SITE_OTHER): Payer: Self-pay | Admitting: Orthopedic Surgery

## 2018-09-12 NOTE — Telephone Encounter (Signed)
09/04/18 ov note faxed to [facility] [phone]

## 2018-09-14 ENCOUNTER — Encounter (INDEPENDENT_AMBULATORY_CARE_PROVIDER_SITE_OTHER): Payer: Self-pay | Admitting: Orthopedic Surgery

## 2018-09-15 ENCOUNTER — Telehealth (INDEPENDENT_AMBULATORY_CARE_PROVIDER_SITE_OTHER): Payer: Self-pay

## 2018-09-15 NOTE — Telephone Encounter (Signed)
Noted  

## 2018-09-15 NOTE — Telephone Encounter (Signed)
Gretchen from [facility] called requesting verbal orders for 2x/wk for 5 wks. I gave her verbal for this.

## 2018-09-16 ENCOUNTER — Other Ambulatory Visit: Payer: Self-pay

## 2018-09-16 DIAGNOSIS — I739 Peripheral vascular disease, unspecified: Secondary | ICD-10-CM

## 2018-09-22 ENCOUNTER — Other Ambulatory Visit: Payer: Self-pay

## 2018-09-22 ENCOUNTER — Ambulatory Visit: Payer: PRIVATE HEALTH INSURANCE | Admitting: Family

## 2018-09-22 ENCOUNTER — Ambulatory Visit (HOSPITAL_COMMUNITY)
Admission: RE | Admit: 2018-09-22 | Discharge: 2018-09-22 | Disposition: A | Discharge status: Home / Self Care | Payer: PRIVATE HEALTH INSURANCE | Source: Ambulatory Visit | Attending: Surgery | Admitting: Surgery

## 2018-09-22 ENCOUNTER — Encounter: Payer: Self-pay | Admitting: Family

## 2018-09-22 VITALS — BP 92/61 | HR 83 | Temp 97.7°F | Resp 16 | Ht 71.0 in | Wt 207.0 lb

## 2018-09-22 DIAGNOSIS — F17200 Nicotine dependence, unspecified, uncomplicated: Secondary | ICD-10-CM

## 2018-09-22 DIAGNOSIS — I779 Disorder of arteries and arterioles, unspecified: Secondary | ICD-10-CM

## 2018-09-22 DIAGNOSIS — I739 Peripheral vascular disease, unspecified: Secondary | ICD-10-CM

## 2018-09-22 DIAGNOSIS — Z89612 Acquired absence of left leg above knee: Secondary | ICD-10-CM

## 2018-09-22 DIAGNOSIS — I872 Venous insufficiency (chronic) (peripheral): Secondary | ICD-10-CM | POA: Diagnosis not present

## 2018-09-22 DIAGNOSIS — Z8669 Personal history of other diseases of the nervous system and sense organs: Secondary | ICD-10-CM

## 2018-09-22 NOTE — Patient Instructions (Signed)
Steps to Quit Smoking  Smoking tobacco can be bad for your health. It can also affect almost every organ in your body. Smoking puts you and people around you at risk for many serious long-lasting (chronic) diseases. Quitting smoking is hard, but it is one of the best things that you can do for your health. It is never too late to quit. What are the benefits of quitting smoking? When you quit smoking, you lower your risk for getting serious diseases and conditions. They can include:  Lung cancer or lung disease.  Heart disease.  Stroke.  Heart attack.  Not being able to have children (infertility).  Weak bones (osteoporosis) and broken bones (fractures). If you have coughing, wheezing, and shortness of breath, those symptoms may get better when you quit. You may also get sick less often. If you are pregnant, quitting smoking can help to lower your chances of having a baby of low birth weight. What can I do to help me quit smoking? Talk with your doctor about what can help you quit smoking. Some things you can do (strategies) include:  Quitting smoking totally, instead of slowly cutting back how much you smoke over a period of time.  Going to in-person counseling. You are more likely to quit if you go to many counseling sessions.  Using resources and support systems, such as: ? Online chats with a counselor. ? Phone quitlines. ? Printed self-help materials. ? Support groups or group counseling. ? Text messaging programs. ? Mobile phone apps or applications.  Taking medicines. Some of these medicines may have nicotine in them. If you are pregnant or breastfeeding, do not take any medicines to quit smoking unless your doctor says it is okay. Talk with your doctor about counseling or other things that can help you. Talk with your doctor about using more than one strategy at the same time, such as taking medicines while you are also going to in-person counseling. This can help make  quitting easier. What things can I do to make it easier to quit? Quitting smoking might feel very hard at first, but there is a lot that you can do to make it easier. Take these steps:  Talk to your family and friends. Ask them to support and encourage you.  Call phone quitlines, reach out to support groups, or work with a counselor.  Ask people who smoke to not smoke around you.  Avoid places that make you want (trigger) to smoke, such as: ? Bars. ? Parties. ? Smoke-break areas at work.  Spend time with people who do not smoke.  Lower the stress in your life. Stress can make you want to smoke. Try these things to help your stress: ? Getting regular exercise. ? Deep-breathing exercises. ? Yoga. ? Meditating. ? Doing a body scan. To do this, close your eyes, focus on one area of your body at a time from head to toe, and notice which parts of your body are tense. Try to relax the muscles in those areas.  Download or buy apps on your mobile phone or tablet that can help you stick to your quit plan. There are many free apps, such as QuitGuide from the CDC (Centers for Disease Control and Prevention). You can find more support from smokefree.gov and other websites. This information is not intended to replace advice given to you by your health care provider. Make sure you discuss any questions you have with your health care provider. Document Released: 04/28/2009 Document Revised: 02/28/2016   Document Reviewed: 11/16/2014 Elsevier Interactive Patient Education  2019 Else[employer] Inc.     Peripheral Vascular Disease  Peripheral vascular disease (PVD) is a disease of the blood vessels that are not part of your heart and brain. A simple term for PVD is poor circulation. In most cases, PVD narrows the blood vessels that carry blood from your heart to the rest of your body. This can reduce the supply of blood to your arms, legs, and internal organs, like your stomach or kidneys. However, PVD most  often affects a person's lower legs and feet. Without treatment, PVD tends to get worse. PVD can also lead to acute ischemic limb. This is when an arm or leg suddenly cannot get enough blood. This is a medical emergency. Follow these instructions at home: Lifestyle  Do not use any products that contain nicotine or tobacco, such as cigarettes and e-cigarettes. If you need help quitting, ask your doctor.  Lose weight if you are overweight. Or, stay at a healthy weight as told by your doctor.  Eat a diet that is low in fat and cholesterol. If you need help, ask your doctor.  Exercise regularly. Ask your doctor for activities that are right for you. General instructions  Take over-the-counter and prescription medicines only as told by your doctor.  Take good care of your feet: ? Wear comfortable shoes that fit well. ? Check your feet often for any cuts or sores.  Keep all follow-up visits as told by your doctor This is important. Contact a doctor if:  You have cramps in your legs when you walk.  You have leg pain when you are at rest.  You have coldness in a leg or foot.  Your skin changes.  You are unable to get or have an erection (erectile dysfunction).  You have cuts or sores on your feet that do not heal. Get help right away if:  Your arm or leg turns cold, numb, and blue.  Your arms or legs become red, warm, swollen, painful, or numb.  You have chest pain.  You have trouble breathing.  You suddenly have weakness in your face, arm, or leg.  You become very confused or you cannot speak.  You suddenly have a very bad headache.  You suddenly cannot see. Summary  Peripheral vascular disease (PVD) is a disease of the blood vessels.  A simple term for PVD is poor circulation. Without treatment, PVD tends to get worse.  Treatment may include exercise, low fat and low cholesterol diet, and quitting smoking. This information is not intended to replace advice given to  you by your health care provider. Make sure you discuss any questions you have with your health care provider. Document Released: 09/26/2009 Document Revised: 08/09/2016 Document Reviewed: 08/09/2016 Elsevier Interactive Patient Education  2019 Elsevier Inc.  

## 2018-09-22 NOTE — Progress Notes (Signed)
[facility] [place]   CC: Follow up peripheral artery occlusive disease  History of Present Illness Peter Cochran is a 63 y.o. male who is s/p SIA+S L SFA (12/12/17) by Dr. Imogene Burnhen. Pt is also s/p left BKA on 02-28-18, which then required a left AKA on 03-07-18 by Dr. Lajoyce Cornersuda.  Dr. Lajoyce Cornersuda was managing his lower extremity wounds, right leg wounds have healed, left AKA stump has healed.   Dr. Imogene Burnhen last evaluated pt on 01-08-18. At that time s/p SIA+S L SFA, B leg ulcers likely due to combined venous and arterial disease. The pt was to follow up with BLE ABI in 3 months. Pt will also need repeat venous reflux studies once wounds stable.  Pt wife states they will make an appointment with [employer] for left LE prosthesis. He is receiving home PT now.   He reports right side weakness since about age 63, and states he was told he had right side facial Bell's Palsy at about age 63. He gets around in a w/c since 2019.   He states that his PCP is aware of his very enlarged scrotum, likely a hernia, is asymptomatic of this.    Diabetic: No Tobacco use: smoker  (1 ppd, started about age 32 yrs)  Pt meds include: Statin :Yes Betablocker: Yes ASA: Yes Other anticoagulants/antiplatelets: Plavix  Past Medical History:  Diagnosis Date  . Cellulitis    mild.  L leg  . CHF (congestive heart failure) (HCC)    Dr. Norman HerrlichBrian Munley  . Complication of anesthesia    'hard time waking up"  . Difficulty voiding   . 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  . Enlarged prostate   . Hand fracture, right    Dr. Linda HedgesJeffrey Yaste  . Hypothyroidism (acquired)   . Leg ulcer, left (HCC)   . PAD (peripheral artery disease) (HCC)   . Periorbital hematoma, right   . Peripheral neuropathy   . Toe ulcer (HCC)    Left  . Wound of left leg    Non-healing. Dr. Liston AlbaPedro Hernandez, Dr. Bynum Bellowsitorya Strover.    Social History Social History   Tobacco Use  . Smoking status: Current Every Day Smoker     Packs/day: 1.50    Years: 50.00    Pack years: 75.00    Types: Cigarettes  . Smokeless tobacco: Never Used  . Tobacco comment: 1-1.5 pks per day  Substance Use Topics  . Alcohol use: Not Currently  . Drug use: Not Currently    Family History Family History  Problem Relation Age of Onset  . Hypertension Mother   . Hyperlipidemia Mother   . Heart attack Father   . Diabetes Father   . Hypertension Father   . Hyperlipidemia Father   . Stroke Maternal Grandmother     Past Surgical History:  Procedure Laterality Date  . AMPUTATION Left 02/28/2018   Procedure: LEFT BELOW KNEE AMPUTATION;  Surgeon: Nadara Mustarduda, Marcus V, MD;  [facility];  Service: Orthopedics;  Laterality: Left;  . AORTOGRAM Left 12/12/2017   Procedure: AORTOGRAM LEFT LOWER EXTREMITY RUNOFF;  Surgeon: Fransisco Hertzhen, Brian L, MD;  [facility];  Service: Vascular;  Laterality: Left;  . BELOW KNEE LEG AMPUTATION Left 02/28/2018  . CYST EXCISION    . PERIPHERAL VASCULAR BALLOON ANGIOPLASTY Left 12/12/2017   Procedure: PERIPHERAL VASCULAR BALLOON ANGIOPLASTY LEFT SFA;  Surgeon: Fransisco Hertzhen, Brian L, MD;  [facility];  Service: Vascular;  Laterality: Left;  . PILONIDAL  CYST DRAINAGE    . STUMP REVISION Left 03/07/2018   Procedure: REVISION LEFT BELOW KNEE AMPUTATION VS. LEFT ABOVE KNEE AMPUTATION;  Surgeon: Nadara Mustard, MD;  Location: [facility] OR;  Service: Orthopedics;  Laterality: Left;    Allergies  Allergen Reactions  . Oxytetracycline Itching, Swelling and Other (See Comments)    Facial swelling  . Sulfa Antibiotics Rash    Current Outpatient Medications  Medication Sig Dispense Refill  . aspirin EC 81 MG tablet Take 81 mg by mouth daily.    Marland Kitchen atorvastatin (LIPITOR) 40 MG tablet Take 1 tablet (40 mg total) by mouth daily. 30 tablet 11  . bethanechol (URECHOLINE) 25 MG tablet Take 50 mg by mouth 3 (three) times daily.    . carvedilol (COREG) 3.125 MG tablet Take 1 tablet (3.125 mg total) by mouth 2 (two) times daily with a  meal. 60 tablet 11  . clopidogrel (PLAVIX) 75 MG tablet Take 1 tablet (75 mg total) by mouth daily with breakfast. 30 tablet 0  . DULoxetine (CYMBALTA) 30 MG capsule Take 30 mg by mouth at bedtime.     . furosemide (LASIX) 20 MG tablet Take 1 tablet (20 mg total) by mouth every Monday, Wednesday, and Friday. 36 tablet 0  . gabapentin (NEURONTIN) 100 MG capsule TAKE 3 CAPSULES 3 TIMES DAILY 90 capsule 3  . levothyroxine (SYNTHROID, LEVOTHROID) 125 MCG tablet Take 125 mcg by mouth daily before breakfast.    . Multiple Vitamin (MULTIVITAMIN WITH MINERALS) TABS tablet Take 1 tablet by mouth daily. 30 tablet 0  . sacubitril-valsartan (ENTRESTO) 24-26 MG Take 1 tablet by mouth 3 (three) times a week. Take on Monday, Wednesday, Friday. 36 tablet 0  . spironolactone (ALDACTONE) 25 MG tablet Take 1 tablet (25 mg total) by mouth daily. 90 tablet 3  . tamsulosin (FLOMAX) 0.4 MG CAPS capsule Take 1 capsule (0.4 mg total) by mouth daily after breakfast. 30 capsule 0  . traMADol (ULTRAM) 50 MG tablet Take 1 tablet by mouth every 4 (four) hours as needed.    Marland Kitchen levofloxacin (LEVAQUIN) 500 MG tablet      No current facility-administered medications for this visit.     ROS: See HPI for pertinent positives and negatives.   Physical Examination  Vitals:   09/22/18 1348 09/22/18 1352  BP: (!) 83/56 92/61  Pulse: 83   Resp: 16   Temp: 97.7 F (36.5 C)   TempSrc: Oral   Weight: 207 lb (93.9 kg)   Height: 5\' 11"  (1.803 m)    Body mass index is 28.87 kg/m.  General: A&O x 3, WN, unkempt male. Gait: seated in his w/c HENT: No gross abnormalities.  Eyes: PERRLA. Pulmonary: Respirations are non labored, limited air movement in all fields, no rales, rhonchi, or wheezes.  Cardiac: regular rhythm, no detected murmur.         Carotid Bruits Right Left   Negative Negative   Radial pulses are 2+ palpable bilaterally   Adominal aortic pulse is not palpable                         VASCULAR  EXAM: Extremities without ischemic changes, without Gangrene; without open wounds. Well healed left AKA stump.  LE Pulses Right Left       FEMORAL  not palpable (seated in his w/c)  not palpable        POPLITEAL  not palpable   AKA       POSTERIOR TIBIAL  not palpable   AKA        DORSALIS PEDIS      ANTERIOR TIBIAL 2+ palpable  AKA    Abdomen: soft, NT, no palpable masses. GU: Very large scrotum, pt states his PCP is aware, likely a hernia, is asymptomatic of this.  Skin: no rashes, no cellulitis, no ulcers noted. Musculoskeletal: no muscle wasting or atrophy.  Neurologic: A&O X 3; appropriate affect, Sensation is normal; MOTOR FUNCTION:  moving all extremities equally, motor strength 5/5 throughout. Speech is fluent/normal. CN 2-12 intact except for right side facial asymmetry with smile. Psychiatric: Thought content is normal, mood appropriate for clinical situation.    ASSESSMENT: Peter Cochran is a 63 y.o. male who is s/p SIA+S L SFA (12/12/17) by Dr. Imogene Burn. Pt is also s/p left BKA on 02-28-18, which then required a left AKA on 03-07-18 by Dr. Lajoyce Corners.  His atherosclerotic risk factors include active smoking since age 39. Fortunately he does not have DM.  He takes a daily statin, ASA, and Plavix.   DATA  ABI (Date: 09/22/2018):  ABI Findings: +---------+------------------+-----+---------+--------+ Right    Rt Pressure (mmHg)IndexWaveform Comment  +---------+------------------+-----+---------+--------+ Brachial 100                    triphasic         +---------+------------------+-----+---------+--------+ PTA      98                0.98 triphasic         +---------+------------------+-----+---------+--------+ DP       104               1.04 triphasic         +---------+------------------+-----+---------+--------+ Great Toe77                0.77  Normal            +---------+------------------+-----+---------+--------+  +--------+------------------+-----+---------+-------+ Left    Lt Pressure (mmHg)IndexWaveform Comment +--------+------------------+-----+---------+-------+ Brachial100                    triphasic        +--------+------------------+-----+---------+-------+ PTA                                     AKA     +--------+------------------+-----+---------+-------+ DP                                      AKA     +--------+------------------+-----+---------+-------+  +-------+-----------+-----------+------------+------------+ ABI/TBIToday's ABIToday's TBIPrevious ABIPrevious TBI +-------+-----------+-----------+------------+------------+ Right  1.04       0.77       1.3         0.91         +-------+-----------+-----------+------------+------------+ Left   AKA        AKA                                 +-------+-----------+-----------+------------+------------+  Arterial wall calcification precludes accurate ankle pressures and ABIs. Right ABIs and  TBIs appear essentially unchanged compared to prior study on 01/08/2018.   Summary: Right: Resting right ankle-brachial index is within normal range. No evidence of significant right lower extremity arterial disease. The right toe-brachial index is normal. RT great toe pressure = 77 mmHg.  Left: AKA.  PLAN:  Based on the patient's vascular studies and examination, pt will return to clinic in 3 months with right ABI and right venous duplex.   Continue exercising right leg with PT.   Over 3 minutes was spent counseling patient re smoking cessation, and patient was given several free resources re smoking cessation.   I discussed in depth with the patient the nature of atherosclerosis, and emphasized the importance of maximal medical management including strict control of blood pressure, blood glucose, and lipid levels,  obtaining regular exercise, and cessation of smoking.  The patient is aware that without maximal medical management the underlying atherosclerotic disease process will progress, limiting the benefit of any interventions.  The patient was given information about PAD including signs, symptoms, treatment, what symptoms should prompt the patient to seek immediate medical care, and risk reduction measures to take.  Charisse March, RN, MSN, FNP-C [facility] [place] Phone: [phone]  Clinic MD: Darrick Penna on call  09/22/18 2:22 PM

## 2018-09-25 NOTE — Progress Notes (Signed)
Cardiology Office Note:    Date:  09/26/2018   ID:  Peter Cochran, DOB 1956-01-26, MRN [id number]  PCP:  Floria Raveling, FNP  Cardiologist:  Norman Herrlich, MD    Referring MD: Floria Raveling, FNP    ASSESSMENT:    1. Chronic systolic heart failure (HCC)   2. Dilated cardiomyopathy (HCC)   3. Chronic hypotension   4. Hypothyroidism, unspecified type    PLAN:    In order of problems listed above:  1. Heart failure is improved New York Heart Association class I-II only because he is so sedentary and limited he is on maximally tolerated therapy including a very small amount Entresto but has had a market subjective and objective improvement we will continue the same regimen.  He sees his PCP frequently and I will see him in the office in 6 months she follows on for liver function lipids thyroid kidney function potassium. 2. Market improvement in despite his chronic hypotension tolerates his loop diuretic MRA minimal beta-blocker minimal Entresto and will continue the same medical regimen. 3. Stable continue his guideline directed therapy for cardiomyopathy and heart failure 4. Stable is been repleted 5. Lipids are ideal with his PAD continue his statin   Next appointment: 6 months   Medication Adjustments/Labs and Tests Ordered: Current medicines are reviewed at length with the patient today.  Concerns regarding medicines are outlined above.  No orders of the defined types were placed in this encounter.  No orders of the defined types were placed in this encounter.   Chief Complaint  Patient presents with  . Follow-up  . Congestive Heart Failure    History of Present Illness:    Peter Cochran is a 63 y.o. male with a hx of systolic heart failure and PAD with left AKA last seen 07/29/18. Compliance with diet, lifestyle and medications: Yes   Echo 09/09/18:   IMPRESSIONS  1. The left ventricle has mild-moderately reduced systolic function, with an ejection fraction  of 40-45%. The cavity size was normal. Left ventricular diastolic Doppler parameters are consistent with impaired relaxation.  2. The right ventricle has normal systolic function. The cavity was normal. There is no increase in right ventricular wall thickness.  3. The mitral valve is normal in structure.  4. The tricuspid valve is normal in structure.  5. The aortic valve is tricuspid Mild thickening of the aortic valve.  6. The pulmonic valve was normal in structure.  7. Moderate hypokinesis of the left ventricular inferior wall.  Since the addition of Entresto he has markedly improved his home blood pressure typically runs 90-100 except the days he takes Entresto and carvedilol and we will have him hold his beta-blocker that morning.  He has no edema shortness of breath chest pain palpitation or syncope he has had a marked improvement in left ventricular dysfunction from very severe to mild to moderate.  In my opinion he is not require an ICD recent labs 08/04/2018 potassium 4.7 creatinine normal TSH 15.8 cholesterol 100 LDL 49 and proBNP level 4149 hemoglobin was 13.  He is on thyroid replacement therapy his weights are stable he sodium restricts and overall he is pleased with the quality of his life Past Medical History:  Diagnosis Date  . Cellulitis    mild.  L leg  . CHF (congestive heart failure) (HCC)    Dr. Norman Herrlich  . Complication of anesthesia    'hard time waking up"  . Difficulty voiding   .  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  . Enlarged prostate   . Hand fracture, right    Dr. Linda Hedges  . Hypothyroidism (acquired)   . Leg ulcer, left (HCC)   . PAD (peripheral artery disease) (HCC)   . Periorbital hematoma, right   . Peripheral neuropathy   . Toe ulcer (HCC)    Left  . Wound of left leg    Non-healing. Dr. Liston Alba, Dr. Bynum Bellows.    Past Surgical History:  Procedure Laterality Date  . AMPUTATION Left 02/28/2018   Procedure: LEFT BELOW KNEE  AMPUTATION;  Surgeon: Nadara Mustard, MD;  Location: [facility] OR;  Service: Orthopedics;  Laterality: Left;  . AORTOGRAM Left 12/12/2017   Procedure: AORTOGRAM LEFT LOWER EXTREMITY RUNOFF;  Surgeon: Fransisco Hertz, MD;  Location: [facility] OR;  Service: Vascular;  Laterality: Left;  . BELOW KNEE LEG AMPUTATION Left 02/28/2018  . CYST EXCISION    . PERIPHERAL VASCULAR BALLOON ANGIOPLASTY Left 12/12/2017   Procedure: PERIPHERAL VASCULAR BALLOON ANGIOPLASTY LEFT SFA;  Surgeon: Fransisco Hertz, MD;  Location: [facility] OR;  Service: Vascular;  Laterality: Left;  . PILONIDAL CYST DRAINAGE    . STUMP REVISION Left 03/07/2018   Procedure: REVISION LEFT BELOW KNEE AMPUTATION VS. LEFT ABOVE KNEE AMPUTATION;  Surgeon: Nadara Mustard, MD;  Location: [facility] OR;  Service: Orthopedics;  Laterality: Left;    Current Medications: Current Meds  Medication Sig  . aspirin EC 81 MG tablet Take 81 mg by mouth daily.  Marland Kitchen atorvastatin (LIPITOR) 40 MG tablet Take 1 tablet (40 mg total) by mouth daily.  . bethanechol (URECHOLINE) 25 MG tablet Take 50 mg by mouth 3 (three) times daily.  . carvedilol (COREG) 3.125 MG tablet Take 1 tablet (3.125 mg total) by mouth 2 (two) times daily with a meal.  . clopidogrel (PLAVIX) 75 MG tablet Take 1 tablet (75 mg total) by mouth daily with breakfast.  . DULoxetine (CYMBALTA) 30 MG capsule Take 30 mg by mouth at bedtime.   . furosemide (LASIX) 20 MG tablet Take 1 tablet (20 mg total) by mouth every Monday, Wednesday, and Friday.  . gabapentin (NEURONTIN) 100 MG capsule TAKE 3 CAPSULES 3 TIMES DAILY  . levofloxacin (LEVAQUIN) 500 MG tablet   . levothyroxine (SYNTHROID, LEVOTHROID) 125 MCG tablet Take 125 mcg by mouth daily before breakfast.  . Multiple Vitamin (MULTIVITAMIN WITH MINERALS) TABS tablet Take 1 tablet by mouth daily.  . sacubitril-valsartan (ENTRESTO) 24-26 MG Take 1 tablet by mouth 3 (three) times a week. Take on Monday, Wednesday, Friday.  Marland Kitchen spironolactone (ALDACTONE) 25 MG tablet Take 1 tablet  (25 mg total) by mouth daily.  . tamsulosin (FLOMAX) 0.4 MG CAPS capsule Take 1 capsule (0.4 mg total) by mouth daily after breakfast.  . traMADol (ULTRAM) 50 MG tablet Take 1 tablet by mouth every 4 (four) hours as needed.     Allergies:   Oxytetracycline and Sulfa antibiotics   Social History   Socioeconomic History  . Marital status: Married    Spouse name: Not on file  . Number of children: Not on file  . Years of education: Not on file  . Highest education level: Not on file  Occupational History  . Not on file  Social Needs  . Financial resource strain: Not on file  . Food insecurity:    Worry: Not on file    Inability: Not on file  . Transportation needs:    Medical: Not on file  Non-medical: Not on file  Tobacco Use  . Smoking status: Current Every Day Smoker    Packs/day: 1.50    Years: 50.00    Pack years: 75.00    Types: Cigarettes  . Smokeless tobacco: Never Used  . Tobacco comment: 1-1.5 pks per day  Substance and Sexual Activity  . Alcohol use: Not Currently  . Drug use: Not Currently  . Sexual activity: Not on file  Lifestyle  . Physical activity:    Days per week: Not on file    Minutes per session: Not on file  . Stress: Not on file  Relationships  . Social connections:    Talks on phone: Not on file    Gets together: Not on file    Attends religious service: Not on file    Active member of club or organization: Not on file    Attends meetings of clubs or organizations: Not on file    Relationship status: Not on file  Other Topics Concern  . Not on file  Social History Narrative  . Not on file     Family History: The patient's family history includes Diabetes in his father; Heart attack in his father; Hyperlipidemia in his father and mother; Hypertension in his father and mother; Stroke in his maternal grandmother. ROS:   Please see the history of present illness.    All other systems reviewed and are negative.  EKGs/Labs/Other Studies  Reviewed:    The following studies were reviewed today  Recent Labs: 12/08/2017: B Natriuretic Peptide 321.2 12/15/2017: Magnesium 1.8; TSH 7.599 03/07/2018: ALT 143; BUN 20; Creatinine, Ser 0.67; Potassium 4.0; Sodium 137 03/10/2018: Hemoglobin 9.5; Platelets 398  Recent Lipid Panel No results found for: CHOL, TRIG, HDL, CHOLHDL, VLDL, LDLCALC, LDLDIRECT  Physical Exam:    VS:  BP (!) 84/52 (BP Location: Left Arm, Patient Position: Sitting, Cuff Size: Large)   Pulse 99   Ht 5\' 11"  (1.803 m)   Wt 207 lb (93.9 kg)   SpO2 97%   BMI 28.87 kg/m     Wt Readings from Last 3 Encounters:  09/26/18 207 lb (93.9 kg)  09/22/18 207 lb (93.9 kg)  09/04/18 206 lb (93.4 kg)     GEN:  Well nourished, well developed in no acute distress HEENT: Normal NECK: No JVD; No carotid bruits LYMPHATICS: No lymphadenopathy CARDIAC: RRR, no murmurs, rubs, gallops RESPIRATORY:  Clear to auscultation without rales, wheezing or rhonchi  ABDOMEN: Soft, non-tender, non-distended MUSCULOSKELETAL:  No edema; No deformity  SKIN: Warm and dry NEUROLOGIC:  Alert and oriented x 3 PSYCHIATRIC:  Normal affect    Signed, Norman Herrlich, MD  09/26/2018 11:48 AM    [facility]

## 2018-09-26 ENCOUNTER — Ambulatory Visit: Payer: PRIVATE HEALTH INSURANCE | Admitting: Cardiology

## 2018-09-26 ENCOUNTER — Other Ambulatory Visit: Payer: Self-pay

## 2018-09-26 ENCOUNTER — Encounter: Payer: Self-pay | Admitting: Cardiology

## 2018-09-26 VITALS — BP 84/52 | HR 99 | Ht 71.0 in | Wt 207.0 lb

## 2018-09-26 DIAGNOSIS — I9589 Other hypotension: Secondary | ICD-10-CM

## 2018-09-26 DIAGNOSIS — E039 Hypothyroidism, unspecified: Secondary | ICD-10-CM | POA: Diagnosis not present

## 2018-09-26 DIAGNOSIS — I420 Dilated cardiomyopathy: Secondary | ICD-10-CM | POA: Diagnosis not present

## 2018-09-26 DIAGNOSIS — I5022 Chronic systolic (congestive) heart failure: Secondary | ICD-10-CM | POA: Diagnosis not present

## 2018-09-26 NOTE — Patient Instructions (Signed)
Medication Instructions:  Your physician has recommended you make the following change in your medication:   **SKIP carvedilol (coreg) morning dose the mornings that you take entresto!   If you need a refill on your cardiac medications before your next appointment, please call your pharmacy.   Lab work: None  If you have labs (blood work) drawn today and your tests are completely normal, you will receive your results only by: Marland Kitchen MyChart Message (if you have MyChart) OR . A paper copy in the mail If you have any lab test that is abnormal or we need to change your treatment, we will call you to review the results.  Testing/Procedures: None  Follow-Up: At [facility], you and your health needs are our priority.  As part of our continuing mission to provide you with exceptional heart care, we have created designated Provider Care Teams.  These Care Teams include your primary Cardiologist (physician) and Advanced Practice Providers (APPs -  Physician Assistants and Nurse Practitioners) who all work together to provide you with the care you need, when you need it. You will need a follow up appointment in 6 months.

## 2018-10-08 ENCOUNTER — Other Ambulatory Visit: Payer: Self-pay | Admitting: Cardiology

## 2018-10-08 ENCOUNTER — Other Ambulatory Visit: Payer: Self-pay | Admitting: *Deleted

## 2018-10-08 MED ORDER — SACUBITRIL-VALSARTAN 24-26 MG PO TABS: ORAL_TABLET | ORAL | 1 refills | Status: DC

## 2018-10-10 ENCOUNTER — Telehealth (INDEPENDENT_AMBULATORY_CARE_PROVIDER_SITE_OTHER): Payer: Self-pay | Admitting: Orthopedic Surgery

## 2018-10-10 NOTE — Telephone Encounter (Signed)
Peter Cochran from [facility] called to request POC orders for 2x a week for 4 weeks to continue working on strength, standing and transfers.  CB#[phone].  Thank you.

## 2018-10-10 NOTE — Telephone Encounter (Signed)
Called and sw Jason physical therapist to advise verbal ok for orders below.

## 2018-10-30 ENCOUNTER — Encounter (INDEPENDENT_AMBULATORY_CARE_PROVIDER_SITE_OTHER): Payer: Self-pay | Admitting: Orthopedic Surgery

## 2018-11-03 ENCOUNTER — Ambulatory Visit (INDEPENDENT_AMBULATORY_CARE_PROVIDER_SITE_OTHER): Payer: PRIVATE HEALTH INSURANCE | Admitting: Orthopedic Surgery

## 2018-11-08 ENCOUNTER — Encounter (INDEPENDENT_AMBULATORY_CARE_PROVIDER_SITE_OTHER): Payer: Self-pay | Admitting: Orthopedic Surgery

## 2018-11-13 ENCOUNTER — Other Ambulatory Visit: Payer: Self-pay | Admitting: Cardiology

## 2018-11-13 NOTE — Telephone Encounter (Signed)
Rx refill sent to pharmacy. 

## 2018-11-17 ENCOUNTER — Encounter: Payer: Self-pay | Admitting: Orthopedic Surgery

## 2018-11-17 ENCOUNTER — Ambulatory Visit: Payer: PRIVATE HEALTH INSURANCE | Admitting: Physician Assistant

## 2018-11-17 ENCOUNTER — Other Ambulatory Visit: Payer: Self-pay

## 2018-11-17 VITALS — Ht 71.0 in | Wt 207.0 lb

## 2018-11-17 DIAGNOSIS — I87321 Chronic venous hypertension (idiopathic) with inflammation of right lower extremity: Secondary | ICD-10-CM

## 2018-11-17 DIAGNOSIS — Z89612 Acquired absence of left leg above knee: Secondary | ICD-10-CM

## 2018-11-17 DIAGNOSIS — I739 Peripheral vascular disease, unspecified: Secondary | ICD-10-CM

## 2018-11-17 MED ORDER — GABAPENTIN 100 MG PO CAPS: ORAL_CAPSULE | ORAL | 11 refills | Status: DC

## 2018-11-17 NOTE — Progress Notes (Signed)
Office Visit Note   Patient: Peter Cochran           Date of Birth: 1955/10/24           MRN: [id number] Visit Date: 11/17/2018              Requested by: Floria Raveling, FNP [address] [place], [place] [address] PCP: Floria Raveling, FNP  Chief Complaint  Patient presents with  . Left Leg - Follow-up    AKA      HPI: The patient is a 63 year old gentleman who is seen for follow-up of his left above-the-knee amputation and venous insufficiency in the right lower extremity.  He reports that he is having continued phantom pain over the left lower extremity and would like to continue on gabapentin 300 mg p.o. 3 times daily and this will be refilled.  He is continuing to have some intermittent rashes over the left thigh from the stump shrinker stocking and he has had intermittently stop wearing it but currently there are no rashes or skin breakdown.  He is wearing a medical compression stocking on the right lower extremity and reports that this is working well and he has not had any rashes with this.  He is pleased with his progress and is working towards getting a prosthetic.  He reports that his strength has improved to the point where he is now a better candidate for getting a prosthesis.  Assessment & Plan: Visit Diagnoses:  1. 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 PAD (peripheral artery disease) (HCC)     Plan: The patient's gabapentin was refilled 300 mg 3 times daily as requested.  We did discuss continued use of his shrinker stocking and he is willing to continue to try this.  He is continuing with the right lower extremity compression sock.  He is otherwise doing well and will follow-up for questions or concerns.  Follow-Up Instructions: Return if symptoms worsen or fail to improve.   Ortho Exam  Patient is alert, oriented, no adenopathy, well-dressed, normal affect,  normal respiratory effort. The left above-the-knee amputation site is well-healed and without signs of skin breakdown or other signs of cellulitis or infection.  The right lower extremity does have some mild edema but he is wearing his compression stocking.  He does report that he has to sit up in his chair for long periods when his wife is away working and we discussed trying to elevate as he is able when he is in this situation. He continues to lack full extension in the right knee but is continuing to work with physical therapy exercises for this.  He is able to boost himself with his upper extremities to pressure relieve more easily now.  Imaging: No results found. No images are attached to the encounter.  Labs: Lab Results  Component Value Date   HGBA1C 6.0 (H) 12/07/2017   ESRSEDRATE 75 (H) 02/19/2018   ESRSEDRATE 46 (H) 01/14/2018   ESRSEDRATE 78 (H) 12/07/2017   CRP 79.4 (H) 02/19/2018   CRP 17.2 (H) 01/14/2018   CRP 13.5 (H) 12/07/2017     Lab Results  Component Value Date   ALBUMIN 2.0 (L) 03/07/2018   ALBUMIN 2.4 (L) 12/07/2017   ALBUMIN 2.8 (L) 12/06/2017   PREALBUMIN 10.8 (L) 12/07/2017    Body mass index is 28.87 kg/m.  Orders:  No orders  of the defined types were placed in this encounter.  Meds ordered this encounter  Medications  . DISCONTD: gabapentin (NEURONTIN) 100 MG capsule    Sig: TAKE 3 CAPSULES 3 TIMES DAILY    Dispense:  90 capsule    Refill:  11  . gabapentin (NEURONTIN) 100 MG capsule    Sig: TAKE 3 CAPSULES 3 TIMES DAILY    Dispense:  90 capsule    Refill:  11     Procedures: No procedures performed  Clinical Data: No additional findings.  ROS:  All other systems negative, except as noted in the HPI. Review of Systems  Objective: Vital Signs: Ht 5\' 11"  (1.803 m)   Wt 207 lb (93.9 kg)   BMI 28.87 kg/m   Specialty Comments:  No specialty comments available.  PMFS History: Patient Active Problem List   Diagnosis Date Noted   . Scrotal edema 03/08/2018  . Anemia 03/08/2018  . Chronic hypotension 03/08/2018  . Leukocytosis 03/08/2018  . S/P AKA (above knee amputation) unilateral, left (HCC) 03/07/2018  . Hypothyroidism 03/07/2018  . BKA stump complication (HCC)   . Hx of BKA, left (HCC) 02/28/2018  . Antibiotic long-term use   . AKI (acute kidney injury) (HCC)   . Severe protein-calorie malnutrition (HCC) 12/12/2017  . Bilateral lower leg cellulitis   . Gangrene of toe of left foot (HCC)   . Pressure injury of skin 12/07/2017  . Sepsis due to cellulitis (HCC) 12/06/2017  . Chronic systolic heart failure (HCC) 10/28/2017  . Dilated cardiomyopathy (HCC) 10/27/2017  . Mitral regurgitation 10/27/2017  . Elevated troponin 10/27/2017  . Elevated brain natriuretic peptide (BNP) level 10/27/2017  . PAD (peripheral artery disease) (HCC) 10/27/2017  . 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 Fatigue 10/22/2017   Past Medical History:  Diagnosis Date  . Cellulitis    mild.  L leg  . CHF (congestive heart failure) (HCC)    Dr. Norman Herrlich  . Complication of anesthesia    'hard time waking up"  . Difficulty voiding   . 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  . Enlarged prostate   . Hand fracture, right    Dr. Linda Hedges  . Hypothyroidism (acquired)   . Leg ulcer, left (HCC)   . PAD (peripheral artery disease) (HCC)   . Periorbital hematoma, right   . Peripheral neuropathy   . Toe ulcer (HCC)    Left  . Wound of left leg    Non-healing. Dr. Liston Alba, Dr. Bynum Bellows.    Family History  Problem Relation Age of Onset  . Hypertension Mother   . Hyperlipidemia Mother   . Heart attack Father   . Diabetes Father   . Hypertension Father   . Hyperlipidemia Father   . Stroke Maternal Grandmother     Past Surgical History:  Procedure Laterality Date  . AMPUTATION Left 02/28/2018   Procedure: LEFT BELOW KNEE AMPUTATION;  Surgeon: Nadara Mustard,  MD;  Location: [facility] OR;  Service: Orthopedics;  Laterality: Left;  . AORTOGRAM Left 12/12/2017   Procedure: AORTOGRAM LEFT LOWER EXTREMITY RUNOFF;  Surgeon: Fransisco Hertz, MD;  Location: [facility] OR;  Service: Vascular;  Laterality: Left;  . BELOW KNEE LEG AMPUTATION Left 02/28/2018  . CYST EXCISION    . PERIPHERAL VASCULAR BALLOON ANGIOPLASTY Left 12/12/2017   Procedure: PERIPHERAL VASCULAR BALLOON ANGIOPLASTY LEFT SFA;  Surgeon: Fransisco Hertz, MD;  Location: [facility] OR;  Service:  Vascular;  Laterality: Left;  . PILONIDAL CYST DRAINAGE    . STUMP REVISION Left 03/07/2018   Procedure: REVISION LEFT BELOW KNEE AMPUTATION VS. LEFT ABOVE KNEE AMPUTATION;  Surgeon: Nadara Mustarduda, Marcus V, MD;  Location: [facility] OR;  Service: Orthopedics;  Laterality: Left;   Social History   Occupational History  . Not on file  Tobacco Use  . Smoking status: Current Every Day Smoker    Packs/day: 1.50    Years: 50.00    Pack years: 75.00    Types: Cigarettes  . Smokeless tobacco: Never Used  . Tobacco comment: 1-1.5 pks per day  Substance and Sexual Activity  . Alcohol use: Not Currently  . Drug use: Not Currently  . Sexual activity: Not on file

## 2018-11-18 ENCOUNTER — Encounter: Payer: Self-pay | Admitting: Physician Assistant

## 2018-11-24 ENCOUNTER — Other Ambulatory Visit: Payer: Self-pay | Admitting: Cardiology

## 2018-11-24 NOTE — Telephone Encounter (Signed)
Rx refill sent to pharmacy. 

## 2018-12-15 ENCOUNTER — Other Ambulatory Visit: Payer: Self-pay | Admitting: Cardiology

## 2018-12-25 ENCOUNTER — Encounter (HOSPITAL_COMMUNITY): Payer: PRIVATE HEALTH INSURANCE

## 2018-12-25 ENCOUNTER — Ambulatory Visit: Payer: PRIVATE HEALTH INSURANCE | Admitting: Family

## 2018-12-30 IMAGING — US US RENAL
1 series · 14 of 25 positions shown · non-contrast
Comparison: None.

CLINICAL DATA: Acute kidney injury.

EXAM:
RENAL / URINARY TRACT ULTRASOUND COMPLETE

[Series 1: us renal · 0.28mm/px · 14 of 30 slices shown]
[im 1/30]
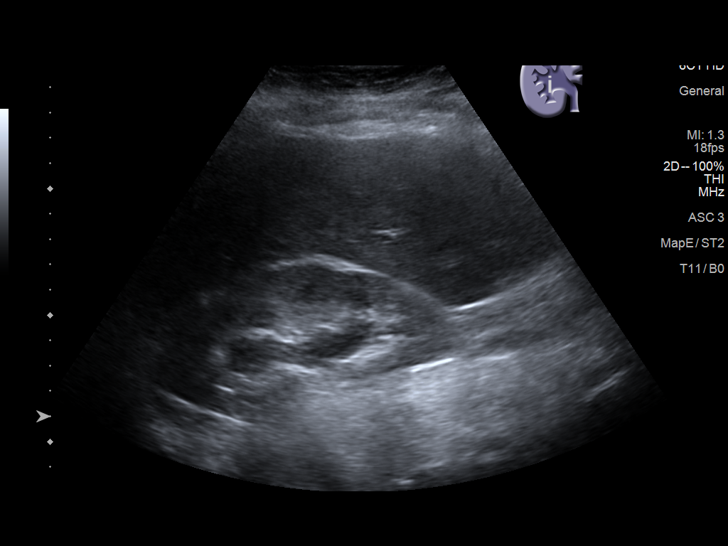
[im 3/30]
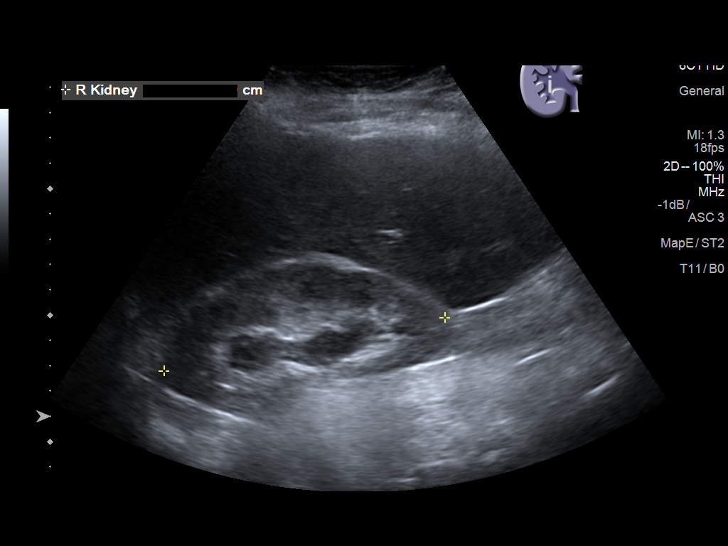
[im 5/30]
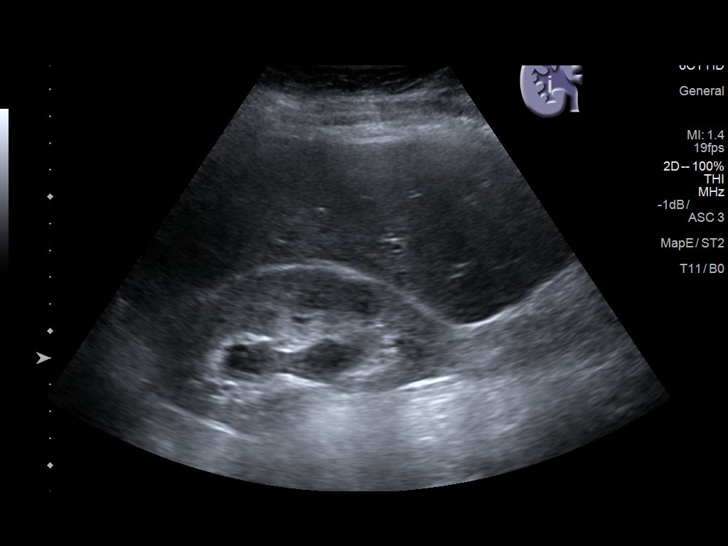
[im 8/30]
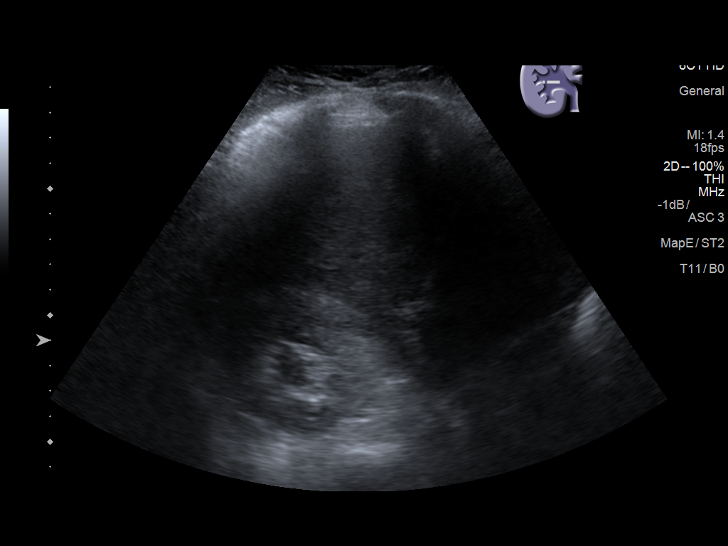
[im 10/30]
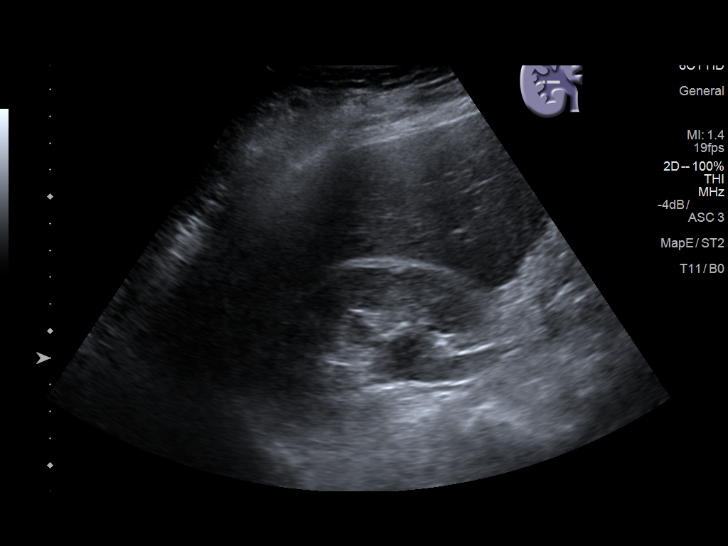
[im 11/30]
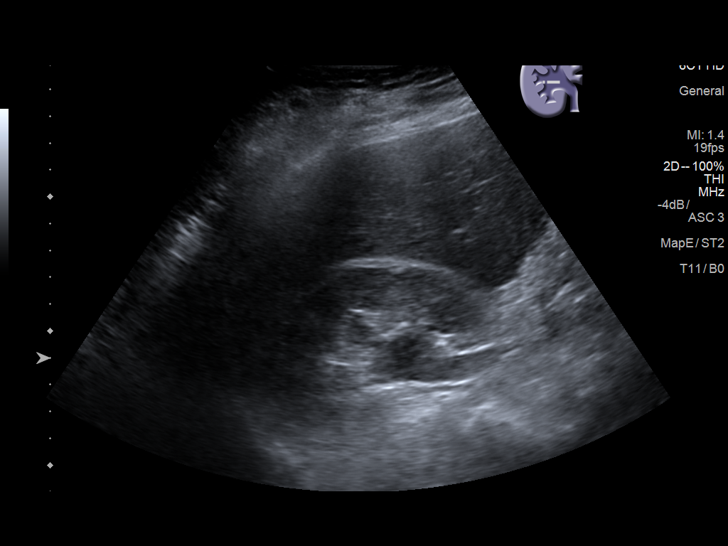
[im 14/30]
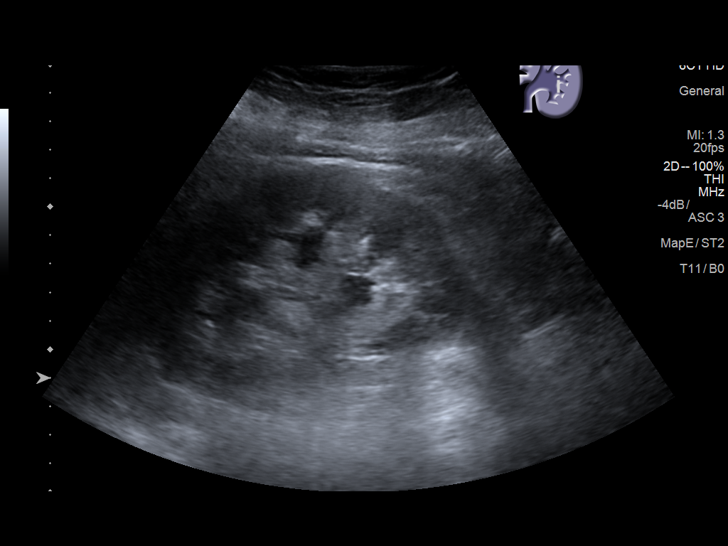
[im 16/30]
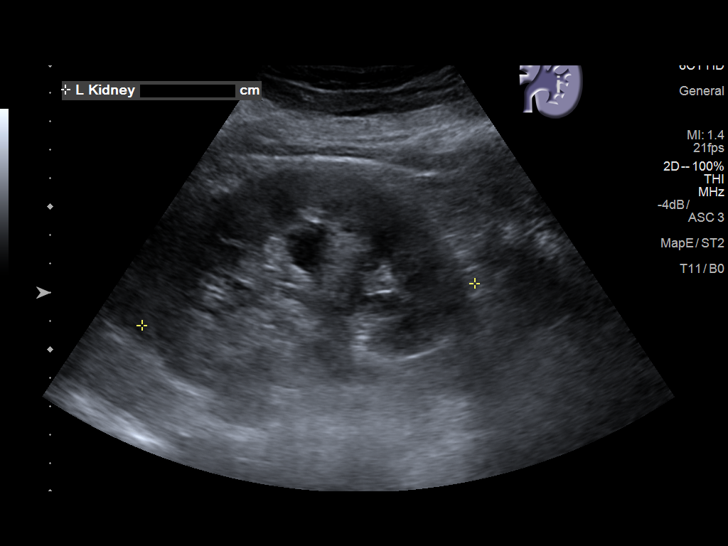
[im 19/30]
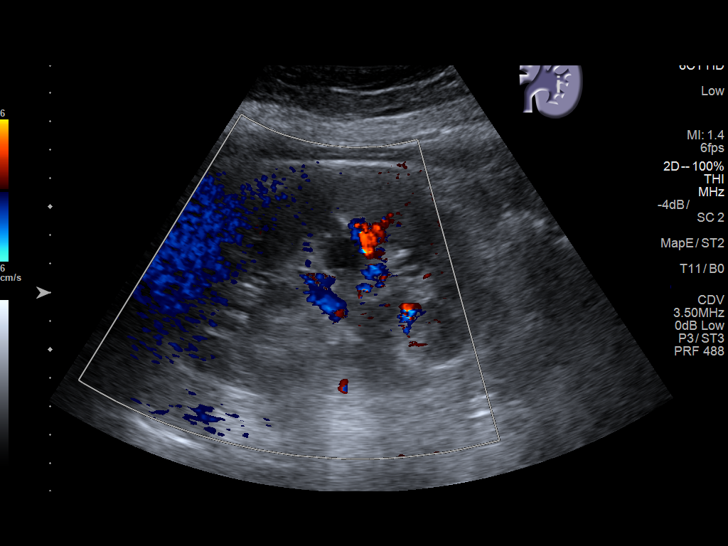
[im 20/30]
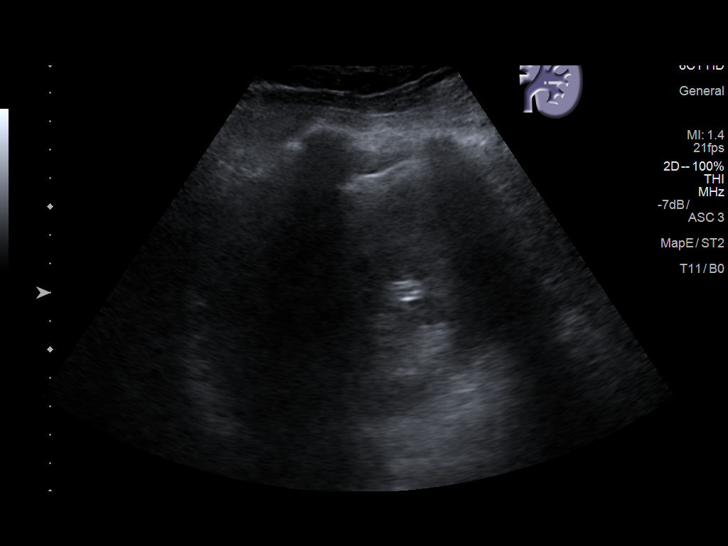
[im 22/30]
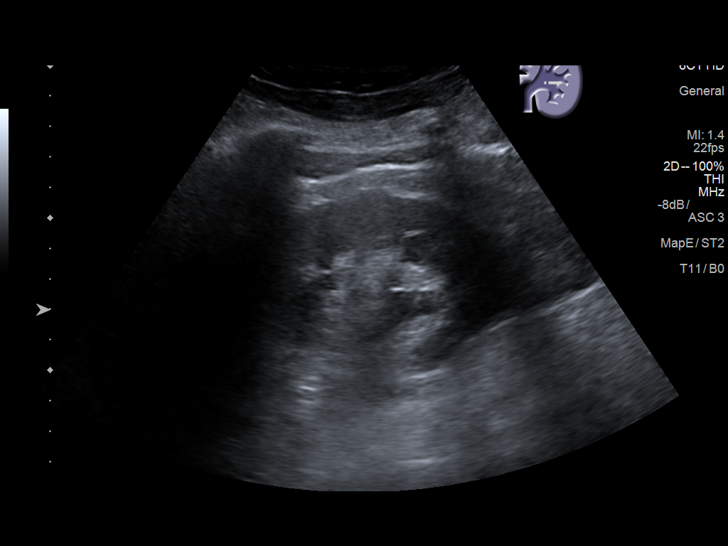
[im 25/30]
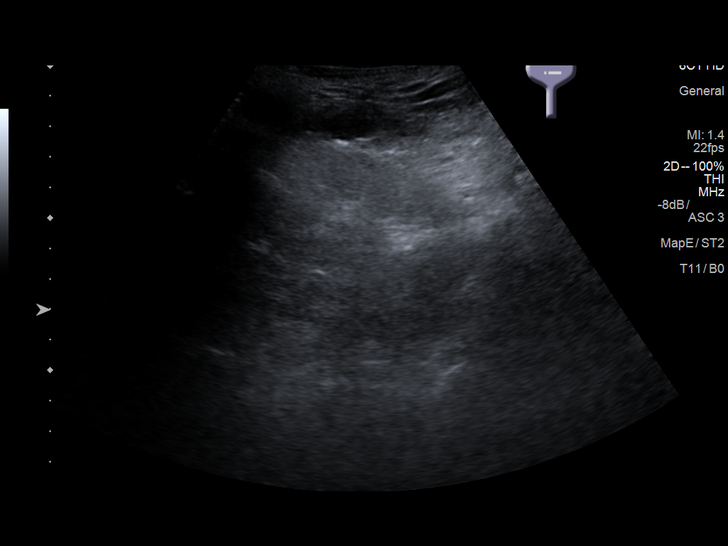
[im 27/30]
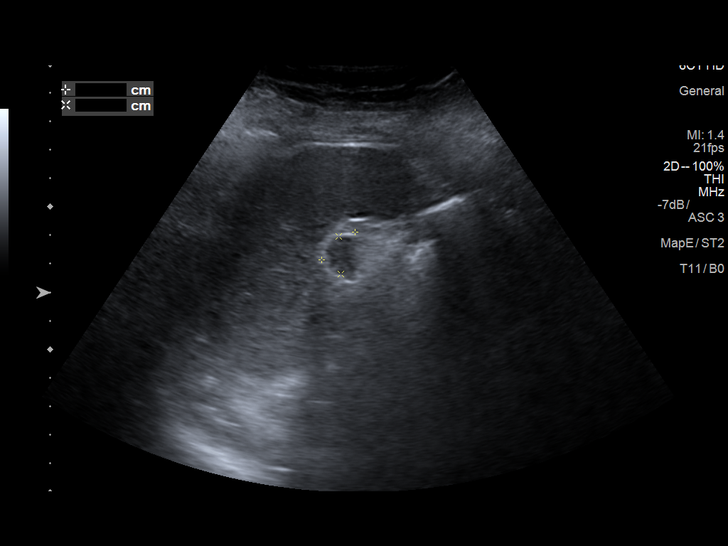
[im 30/30]
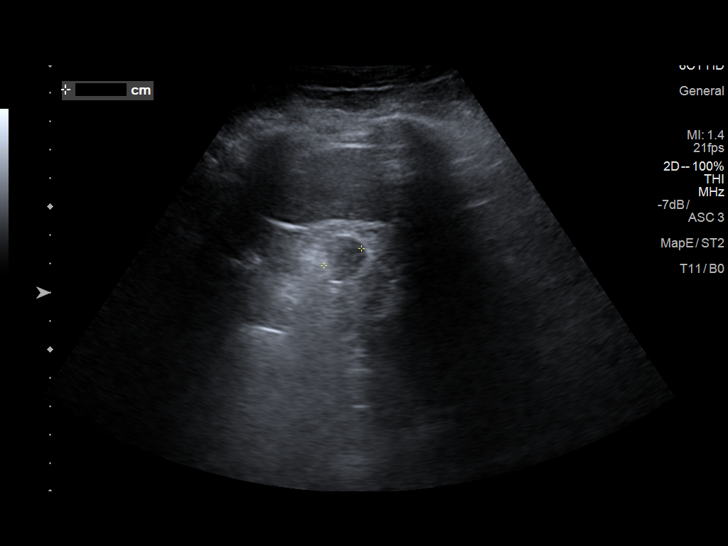

[14 of 25 positions shown; findings below may reference images not displayed]

FINDINGS: Right Kidney:

Length: 11.3 cm. Echogenicity within normal limits. Mild
hydronephrosis. No mass.

Left Kidney:

Length: 11.8 cm.. Echogenicity within normal limits. Mild
hydronephrosis. No mass.

Bladder:

Appears normal for degree of bladder distention.
IMPRESSION: 1. Mild bilateral hydronephrosis.

## 2019-01-13 ENCOUNTER — Other Ambulatory Visit: Payer: Self-pay | Admitting: Cardiology

## 2019-01-13 DIAGNOSIS — I5022 Chronic systolic (congestive) heart failure: Secondary | ICD-10-CM

## 2019-02-28 ENCOUNTER — Other Ambulatory Visit: Payer: Self-pay | Admitting: Cardiology

## 2019-03-04 ENCOUNTER — Telehealth: Payer: Self-pay | Admitting: Cardiology

## 2019-03-04 NOTE — Telephone Encounter (Signed)
Would like B[facility] to call him about this pt

## 2019-03-05 NOTE — Telephone Encounter (Signed)
Dr. Bettina Gavia spoke with Dr. Lilia Pro yesterday regarding patient.

## 2019-04-22 IMAGING — DX DG FOOT COMPLETE 3+V*R*
3 series · 3 of 3 positions shown · non-contrast
Comparison: 12/02/2017

CLINICAL DATA: Legs are full of fluid

EXAM:
RIGHT FOOT COMPLETE - 3+ VIEW

[foot obl]
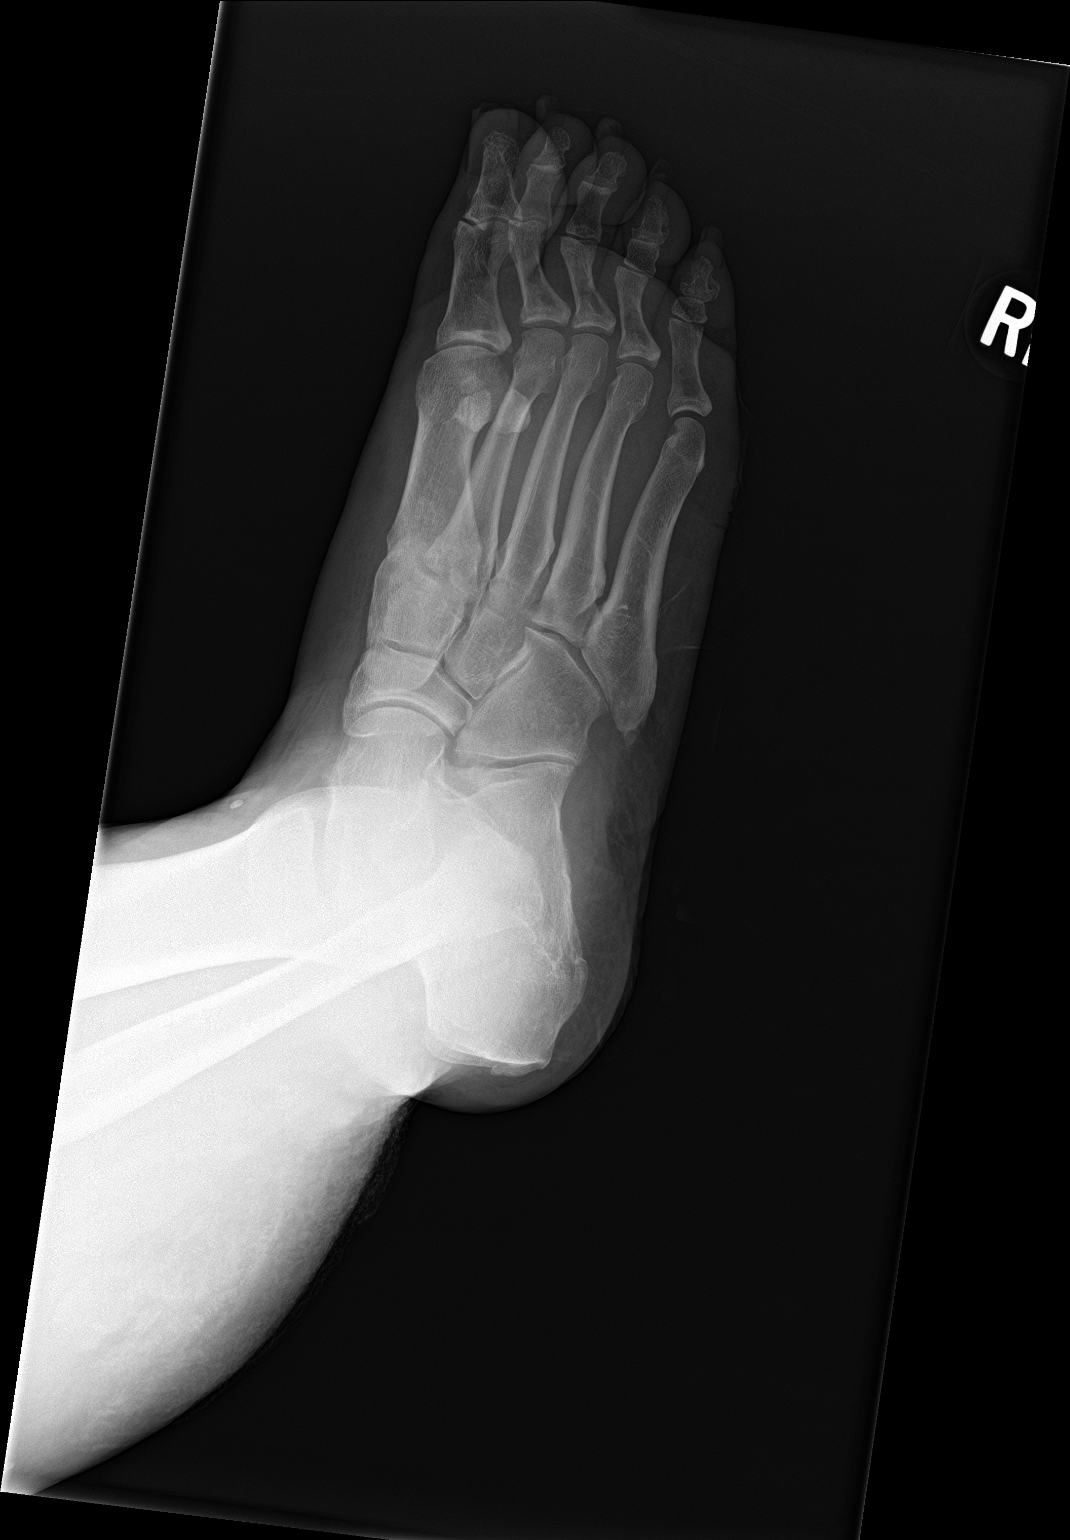

[foot lat]
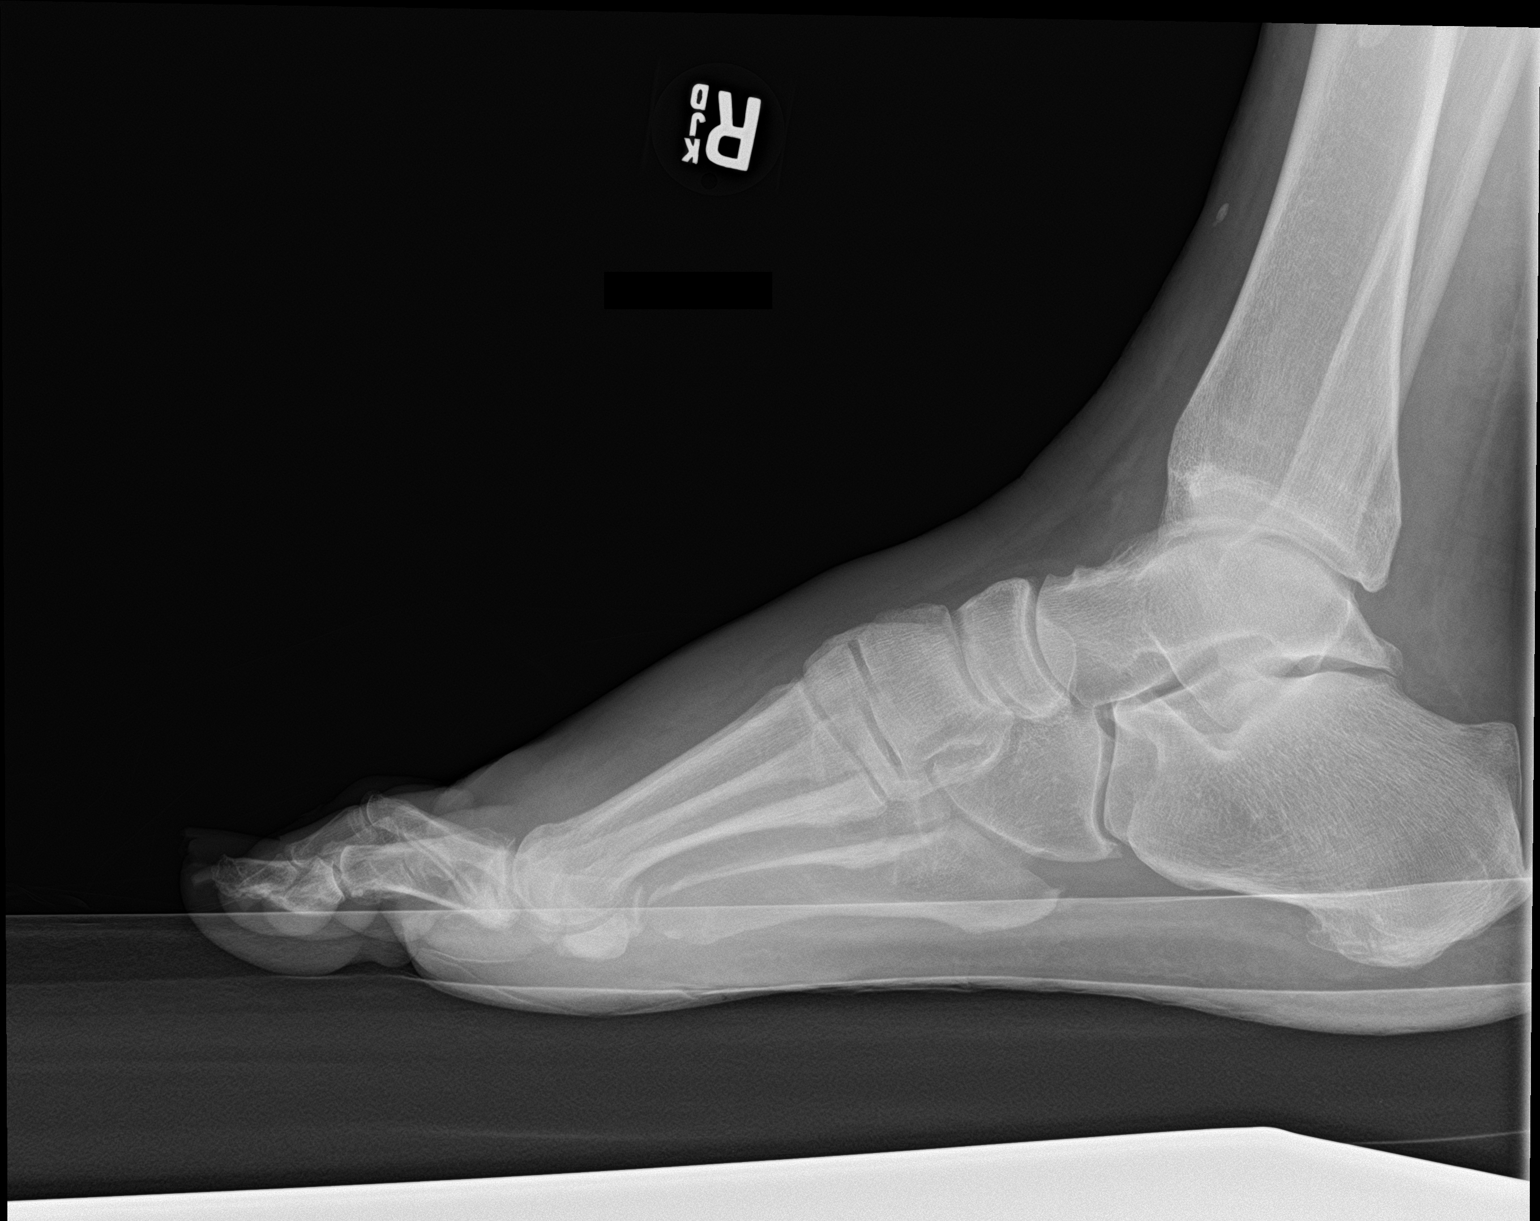

[foot ap]
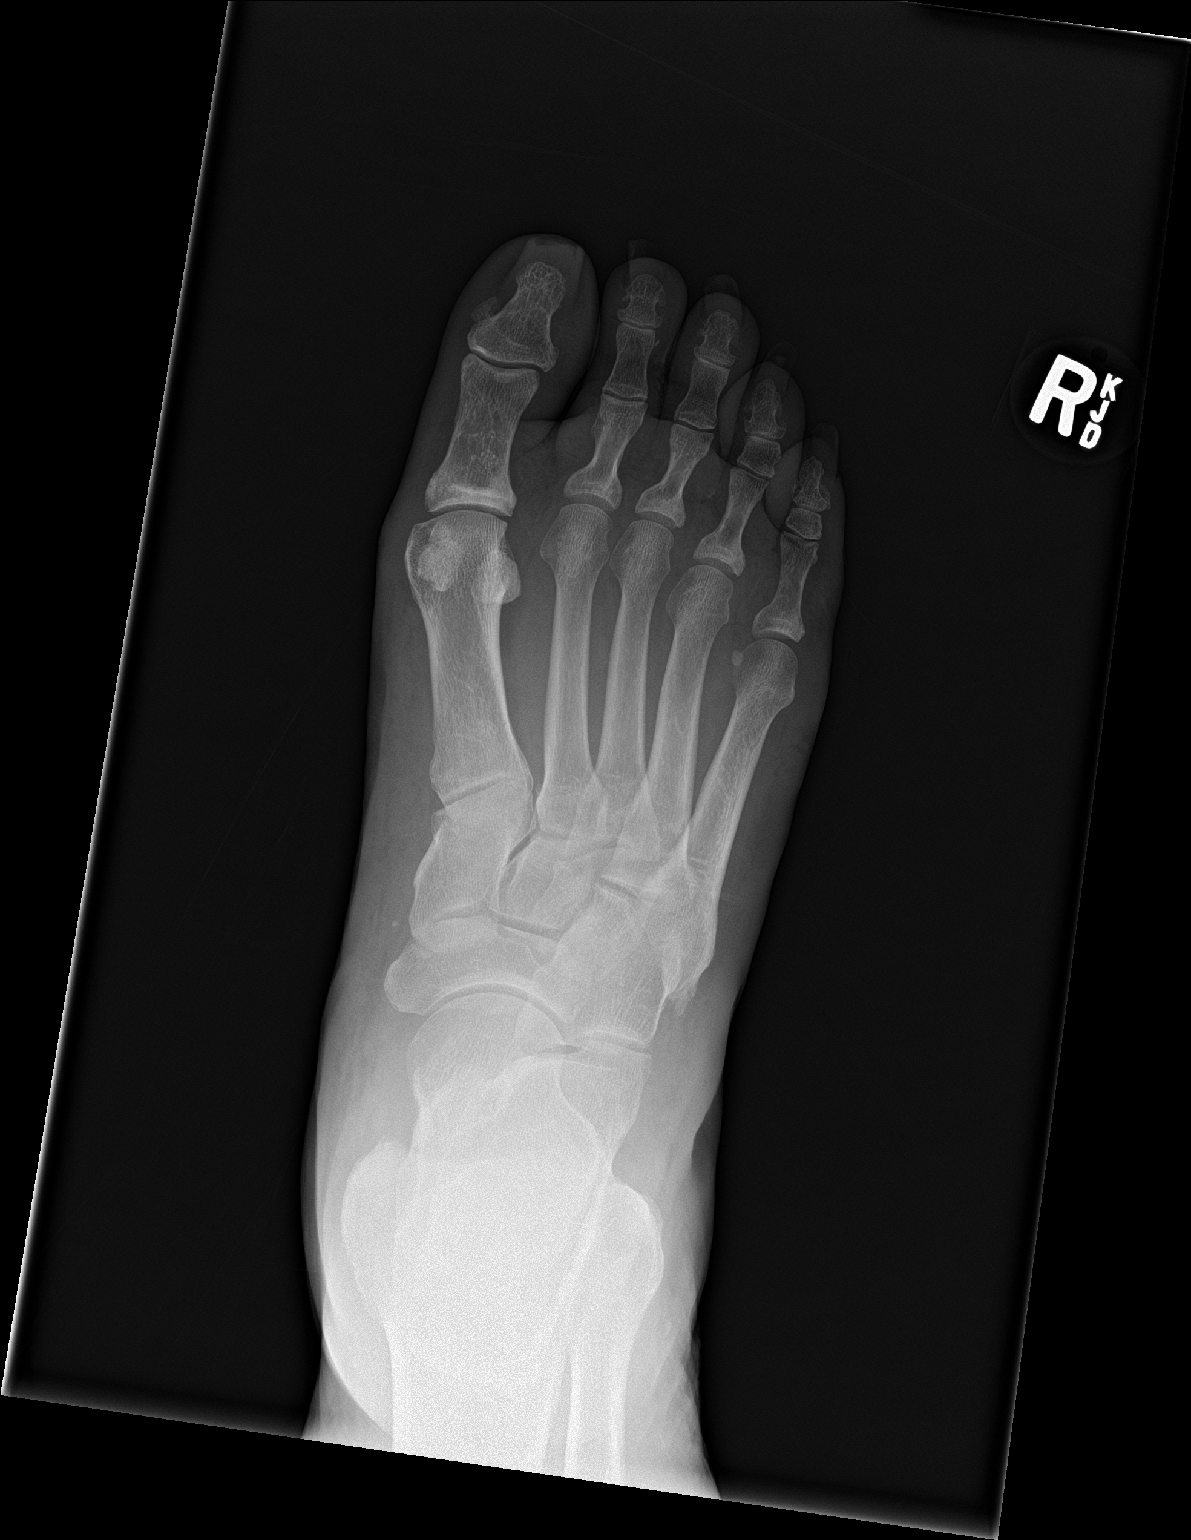

[3 of 3 positions shown; findings below may reference images not displayed]

FINDINGS: No fracture or malalignment. Small plantar calcaneal spur. Soft
tissue edema. No soft tissue gas or foreign body
IMPRESSION: No acute osseous abnormality

## 2019-04-24 ENCOUNTER — Ambulatory Visit: Payer: PRIVATE HEALTH INSURANCE | Admitting: Cardiology

## 2019-05-11 ENCOUNTER — Ambulatory Visit: Payer: PRIVATE HEALTH INSURANCE | Admitting: Cardiology

## 2019-07-18 ENCOUNTER — Other Ambulatory Visit: Payer: Self-pay | Admitting: Cardiology

## 2019-08-03 ENCOUNTER — Other Ambulatory Visit: Payer: Self-pay

## 2019-08-03 MED ORDER — ENTRESTO 24-26 MG PO TABS: ORAL_TABLET | ORAL | 0 refills | Status: DC

## 2019-08-06 ENCOUNTER — Encounter: Payer: Self-pay | Admitting: Sports Medicine

## 2019-08-06 ENCOUNTER — Other Ambulatory Visit: Payer: Self-pay | Admitting: Sports Medicine

## 2019-08-06 ENCOUNTER — Ambulatory Visit: Payer: PRIVATE HEALTH INSURANCE | Admitting: Sports Medicine

## 2019-08-06 ENCOUNTER — Ambulatory Visit (INDEPENDENT_AMBULATORY_CARE_PROVIDER_SITE_OTHER): Payer: PRIVATE HEALTH INSURANCE

## 2019-08-06 ENCOUNTER — Other Ambulatory Visit: Payer: Self-pay

## 2019-08-06 DIAGNOSIS — I872 Venous insufficiency (chronic) (peripheral): Secondary | ICD-10-CM | POA: Diagnosis not present

## 2019-08-06 DIAGNOSIS — L03031 Cellulitis of right toe: Secondary | ICD-10-CM

## 2019-08-06 DIAGNOSIS — G629 Polyneuropathy, unspecified: Secondary | ICD-10-CM

## 2019-08-06 DIAGNOSIS — B351 Tinea unguium: Secondary | ICD-10-CM | POA: Diagnosis not present

## 2019-08-06 DIAGNOSIS — I739 Peripheral vascular disease, unspecified: Secondary | ICD-10-CM

## 2019-08-06 DIAGNOSIS — S90414A Abrasion, right lesser toe(s), initial encounter: Secondary | ICD-10-CM

## 2019-08-06 DIAGNOSIS — M79674 Pain in right toe(s): Secondary | ICD-10-CM | POA: Diagnosis not present

## 2019-08-06 DIAGNOSIS — L02611 Cutaneous abscess of right foot: Secondary | ICD-10-CM

## 2019-08-06 DIAGNOSIS — L853 Xerosis cutis: Secondary | ICD-10-CM

## 2019-08-06 NOTE — Progress Notes (Signed)
Subjective: Peter Cochran is a 64 y.o. 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 Lajoyce Corners and is unsure if this sock is helping reports that it does keep the swelling down but he is concerned that the sock may be too tight on the toes causing abrasions versus him accidentally bumping the toes when he is in his wheelchair or sleeping in the middle of the night because he tends to have restless leg and could have possibly bumped his foot on the table that sits next to his bed while sleeping.  Patient denies any active drainage coming from the right foot any significant warmth odor or any other constitutional symptoms at this time.  Review of Systems  All other systems reviewed and are negative.    Patient Active Problem List   Diagnosis Date Noted  . Scrotal edema 03/08/2018  . Anemia 03/08/2018  . Chronic hypotension 03/08/2018  . Leukocytosis 03/08/2018  . S/P AKA (above knee amputation) unilateral, left (HCC) 03/07/2018  . Hypothyroidism 03/07/2018  . BKA stump complication (HCC)   . Hx of BKA, left (HCC) 02/28/2018  . Antibiotic long-term use   . AKI (acute kidney injury) (HCC)   . Severe protein-calorie malnutrition (HCC) 12/12/2017  . Bilateral lower leg cellulitis   . Gangrene of toe of left foot (HCC)   . Pressure injury of skin 12/07/2017  . Sepsis due to cellulitis (HCC) 12/06/2017  . Chronic systolic heart failure (HCC) 10/28/2017  . Dilated cardiomyopathy (HCC) 10/27/2017  . Mitral regurgitation 10/27/2017  . Elevated troponin 10/27/2017  . Elevated brain natriuretic peptide (BNP) level 10/27/2017  . PAD (peripheral artery disease)  (HCC) 10/27/2017  . 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 Fatigue 10/22/2017   Current Outpatient Medications on File Prior to Visit  Medication Sig Dispense Refill  . aspirin EC 81 MG tablet Take 81 mg by mouth daily.    Marland Kitchen atorvastatin (LIPITOR) 40 MG tablet TAKE ONE (1) TABLET ONCE DAILY 90 tablet 2  . bethanechol (URECHOLINE) 25 MG tablet Take 50 mg by mouth 3 (three) times daily.    . carvedilol (COREG) 3.125 MG tablet TAKE ONE TABLET TWICE DAILY WITH MEALS 60 tablet 0  . clopidogrel (PLAVIX) 75 MG tablet Take 1 tablet (75 mg total) by mouth daily with breakfast. 30 tablet 0  . DULoxetine (CYMBALTA) 30 MG capsule Take 30 mg by mouth at bedtime.     . furosemide (LASIX) 20 MG tablet TAKE 1 TABLET BY MOUTH EVERY MONDAY, WEDNESDAY, AND FRIDAY 36 tablet 4  . gabapentin (NEURONTIN) 100 MG capsule TAKE 3 CAPSULES 3 TIMES DAILY 90 capsule 11  . levofloxacin (LEVAQUIN) 500 MG tablet     . levothyroxine (SYNTHROID) 175 MCG tablet Take 175 mcg by mouth daily.    Marland Kitchen levothyroxine (SYNTHROID, LEVOTHROID) 125 MCG tablet Take 125 mcg by mouth daily before breakfast.    . Multiple Vitamin (MULTIVITAMIN WITH MINERALS) TABS tablet Take 1 tablet by mouth daily. 30 tablet 0  . sacubitril-valsartan (ENTRESTO) 24-26 MG TAKE 1 TABLET BY MOUTH 3 TIMES A WEEK ONMONDAY, WEDNESDAY AND FRIDAY  30 tablet 0  . spironolactone (ALDACTONE) 25 MG tablet TAKE ONE (1) TABLET BY MOUTH ONCE DAILY 90 tablet 3  . tamsulosin (FLOMAX) 0.4 MG CAPS capsule Take 1 capsule (0.4 mg total) by mouth daily after breakfast. 30 capsule 0  . traMADol (ULTRAM) 50 MG tablet Take 1 tablet by mouth every 4 (four) hours as needed.     No current facility-administered medications on file prior to visit.   Allergies  Allergen Reactions  . Oxytetracycline Itching, Swelling and Other (See Comments)    Facial swelling  . Sulfa Antibiotics Rash    No results found for this or any previous visit (from  the past 2160 hour(s)).  Objective: General: Patient is awake, alert, and oriented x 3 and in no acute distress in wheelchair.  Integument: Skin is cool, dry and supple on right with 2 minor abrasions noted at the right first and second toes that appear to be dry and scabbed over. No open lesions or preulcerative lesions present bilateral. Remaining integument unremarkable.  Vasculature:  Pedal pulses are nonpalpable on the right with significant purple discoloration with the leg and dependency and no pedal hair growth with decreased temperature.  Edema appears to be controlled with current use of compression sock.  Neurology: Protective sensation absent there is tingling and burning pain to the right lower extremity.  Musculoskeletal: Post left above-knee amputation.  No pain to palpation to right foot besides the subjective tingling and burning pain.  Mild digital contractures.  X-ray right foot decreased osseous mineralization from disuse, no bony erosions or gas in soft tissues, no acute signs of infection, mild digital contractures, no other acute findings.  Assessment and Plan: Problem List Items Addressed This Visit      Cardiovascular and Mediastinum   PAD (peripheral artery disease) ([place])    Other Visit Diagnoses    Venous insufficiency (chronic) (peripheral)    -  Primary   Pain due to onychomycosis of toenail of right foot       Dry skin       Abrasion of toe of right foot, initial encounter       Neuropathy         -Examined patient. -X-rays reviewed -Discussed and educated patient on the importance of routine foot care because he is a amputee with peripheral arterial disease -Mechanically debrided all nails 1-5 on right using sterile nail nipper and filed with dremel without incident  -Dispensed foot miracle cream to use for dry skin and at abrasions and advised patient to refrain from picking at abrasions and bumping the foot to prevent these abrasions from getting  worse or infected at this time there are no acute signs of infection and no acute signs of gangrene on the right -Recommend vascular follow-up referral made in the setting of left amputation with abrasions on right and is high risk, patient has not been seen in over a year for continued vascular care -Advised patient to continue with sock as prescribed and given by Dr. Sharol Given however must make sure the sock is not rubbing and cutting into his toes and must remove the sock at bedtime -Continue with follow-up with Dr. Sharol Given for care at left amputation stump site -Answered all patient questions -Patient to return in 9 to 10 weeks for at risk foot care -Patient advised to call the office if any problems or questions arise in the meantime.  Landis Martins, DPM

## 2019-08-07 ENCOUNTER — Other Ambulatory Visit: Payer: Self-pay | Admitting: Sports Medicine

## 2019-08-07 ENCOUNTER — Telehealth: Payer: Self-pay | Admitting: *Deleted

## 2019-08-07 DIAGNOSIS — I872 Venous insufficiency (chronic) (peripheral): Secondary | ICD-10-CM

## 2019-08-07 DIAGNOSIS — S90414A Abrasion, right lesser toe(s), initial encounter: Secondary | ICD-10-CM

## 2019-08-07 DIAGNOSIS — G629 Polyneuropathy, unspecified: Secondary | ICD-10-CM

## 2019-08-07 DIAGNOSIS — I83019 Varicose veins of right lower extremity with ulcer of unspecified site: Secondary | ICD-10-CM

## 2019-08-07 DIAGNOSIS — L97929 Non-pressure chronic ulcer of unspecified part of left lower leg with unspecified severity: Secondary | ICD-10-CM

## 2019-08-07 DIAGNOSIS — I83029 Varicose veins of left lower extremity with ulcer of unspecified site: Secondary | ICD-10-CM

## 2019-08-07 DIAGNOSIS — I96 Gangrene, not elsewhere classified: Secondary | ICD-10-CM

## 2019-08-07 DIAGNOSIS — L97919 Non-pressure chronic ulcer of unspecified part of right lower leg with unspecified severity: Secondary | ICD-10-CM

## 2019-08-07 DIAGNOSIS — I739 Peripheral vascular disease, unspecified: Secondary | ICD-10-CM

## 2019-08-07 NOTE — Telephone Encounter (Signed)
Faxed referral, clinicals and demographics to [facility]. 

## 2019-08-07 NOTE — Telephone Encounter (Signed)
-----   Message from [place], [place] sent at 08/06/2019  3:02 PM EST ----- Regarding: refer to vascular Amputee on left  Abrasion to right 1-2 toes  Nonpalpable pulses with venous congestion and purple hue to toes on right Please refer to vascular. HE DOES NOT WANT TO GO TO [facility], he has been there in the past and did not like the providers there Thanks Dr. Marylene Land

## 2019-08-10 NOTE — Progress Notes (Signed)
Cardiology Office Note:    Date:  08/11/2019   ID:  Peter Cochran, DOB March 15, 1956, MRN [id number]  PCP:  [facility]  Cardiologist:  Norman Herrlich, MD    Referring MD: Asencion Islam, DPM    ASSESSMENT:    1. Chronic systolic heart failure (HCC)   2. Dilated cardiomyopathy (HCC)   3. Chronic hypotension   4. Hypothyroidism, unspecified type   5. PAD (peripheral artery disease) (HCC)    PLAN:    In order of problems listed above:  1. Improved continue guideline directed therapy recheck echocardiogram if EF is severely reduced again we need to consider ICD therapy 2. Continue guideline directed treatment 3. Improved no longer on midodrine 4. Stable held labs drawn 08/25/2019 at the [facility] [place] [facility] and have asked him to include lipids BMP proBNP 5. Stable he has had a left lower extremity amputation wounds are healed in the right lower extremity   Next appointment: 3 months   Medication Adjustments/Labs and Tests Ordered: Current medicines are reviewed at length with the patient today.  Concerns regarding medicines are outlined above.  No orders of the defined types were placed in this encounter.  No orders of the defined types were placed in this encounter.   Chief Complaint  Patient presents with  . Congestive Heart Failure    History of Present Illness:    Peter Cochran is a 64 y.o. male with a hx of systolic heart failure and PAD with left AKA   He was last seen 09/26/2018 and improved on guideline directed therapy including low-dose beta-blocker low-dose Entresto and his loop diuretic with MRA. Compliance with diet, lifestyle and medications: Yes  Echo 09/09/18:   IMPRESSIONS  1. The left ventricle has mild-moderately reduced systolic function, with an ejection fraction of 40-45%. The cavity size was normal. Left ventricular diastolic Doppler parameters are consistent with impaired relaxation.  2. The right ventricle has normal  systolic function. The cavity was normal. There is no increase in right ventricular wall thickness.  3. The mitral valve is normal in structure.  Trivial mitral regurgitation  4. The tricuspid valve is normal in structure.  5. The aortic valve is tricuspid Mild thickening of the aortic valve.  6. The pulmonic valve was normal in structure.  7. Moderate hypokinesis of the left ventricular inferior wall.  Continues to improve.  He tolerates his low-dose of guideline directed therapy including MRA beta-blocker Entresto and loop diuretic.  He is pleased with the quality of his life has no edema shortness of breath chest pain palpitation or syncope and asked me to arrange a right lower extremity venous duplex study recommended by vascular surgery.  He is following with podiatry for chronic ischemia in his toes.  We discussed the potential of further invasive evaluation for CAD but I think the patient's comorbidities are so severe and he is done so well with medical therapy but at this time I would not recommend cardiac invasive procedures.  His ejection fraction is improved to the point where I do not feel he requires an ICD  Past Medical History:  Diagnosis Date  . Cellulitis    mild.  L leg  . CHF (congestive heart failure) (HCC)    Dr. Norman Herrlich  . Complication of anesthesia    'hard time waking up"  . Difficulty voiding   . 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  . Enlarged prostate   . Hand fracture,  right    Dr. Joya Salm  . Hypothyroidism (acquired)   . Leg ulcer, left ([place])   . PAD (peripheral artery disease) ([place])   . Periorbital hematoma, right   . Peripheral neuropathy   . Toe ulcer ([place])    Left  . Wound of left leg    Non-healing. Dr. Meda Coffee, Dr. Cherlyn Labella.    Past Surgical History:  Procedure Laterality Date  . AMPUTATION Left 02/28/2018   Procedure: LEFT BELOW KNEE AMPUTATION;  Surgeon: Newt Minion, MD;  Location: [place];  Service: Orthopedics;   Laterality: Left;  . AORTOGRAM Left 12/12/2017   Procedure: AORTOGRAM LEFT LOWER EXTREMITY RUNOFF;  Surgeon: Conrad [place], MD;  Location: [place];  Service: Vascular;  Laterality: Left;  . BELOW KNEE LEG AMPUTATION Left 02/28/2018  . CYST EXCISION    . PERIPHERAL VASCULAR BALLOON ANGIOPLASTY Left 12/12/2017   Procedure: PERIPHERAL VASCULAR BALLOON ANGIOPLASTY LEFT SFA;  Surgeon: Conrad [place], MD;  Location: [place];  Service: Vascular;  Laterality: Left;  . PILONIDAL CYST DRAINAGE    . STUMP REVISION Left 03/07/2018   Procedure: REVISION LEFT BELOW KNEE AMPUTATION VS. LEFT ABOVE KNEE AMPUTATION;  Surgeon: Newt Minion, MD;  Location: [place];  Service: Orthopedics;  Laterality: Left;    Current Medications: Current Meds  Medication Sig  . aspirin EC 81 MG tablet Take 81 mg by mouth daily.  Marland Kitchen atorvastatin (LIPITOR) 40 MG tablet TAKE ONE (1) TABLET ONCE DAILY  . carvedilol (COREG) 3.125 MG tablet TAKE ONE TABLET TWICE DAILY WITH MEALS  . clopidogrel (PLAVIX) 75 MG tablet Take 1 tablet (75 mg total) by mouth daily with breakfast.  . DULoxetine (CYMBALTA) 60 MG capsule Take 60 mg by mouth at bedtime.  . furosemide (LASIX) 20 MG tablet TAKE 1 TABLET BY MOUTH EVERY MONDAY, WEDNESDAY, AND FRIDAY  . gabapentin (NEURONTIN) 100 MG capsule TAKE 3 CAPSULES 3 TIMES DAILY  . levothyroxine (SYNTHROID) 175 MCG tablet Take 175 mcg by mouth daily.  . Multiple Vitamin (MULTIVITAMIN WITH MINERALS) TABS tablet Take 1 tablet by mouth daily.  . sacubitril-valsartan (ENTRESTO) 24-26 MG TAKE 1 TABLET BY MOUTH 3 TIMES A WEEK ONMONDAY, WEDNESDAY AND FRIDAY  . spironolactone (ALDACTONE) 25 MG tablet TAKE ONE (1) TABLET BY MOUTH ONCE DAILY  . traMADol (ULTRAM) 50 MG tablet Take 1 tablet by mouth every 4 (four) hours as needed.  . triamcinolone cream (KENALOG) 0.1 % Apply 1 application topically as needed.     Allergies:   Oxytetracycline and Sulfa antibiotics   Social History   Socioeconomic History  . Marital  status: Married    Spouse name: Not on file  . Number of children: Not on file  . Years of education: Not on file  . Highest education level: Not on file  Occupational History  . Not on file  Tobacco Use  . Smoking status: Current Every Day Smoker    Packs/day: 1.50    Years: 50.00    Pack years: 75.00    Types: Cigarettes  . Smokeless tobacco: Never Used  . Tobacco comment: 1-1.5 pks per day  Substance and Sexual Activity  . Alcohol use: Not Currently  . Drug use: Not Currently  . Sexual activity: Not on file  Other Topics Concern  . Not on file  Social History Narrative  . Not on file   Social Determinants of Health   Financial Resource Strain:   . Difficulty of Paying Living Expenses: Not on file  Food Insecurity:   . Worried About Programme researcher, broadcasting/film/video in the Last Year: Not on file  . Ran Out of Food in the Last Year: Not on file  Transportation Needs:   . Lack of Transportation (Medical): Not on file  . Lack of Transportation (Non-Medical): Not on file  Physical Activity:   . Days of Exercise per Week: Not on file  . Minutes of Exercise per Session: Not on file  Stress:   . Feeling of Stress : Not on file  Social Connections:   . Frequency of Communication with Friends and Family: Not on file  . Frequency of Social Gatherings with Friends and Family: Not on file  . Attends Religious Services: Not on file  . Active Member of Clubs or Organizations: Not on file  . Attends Banker Meetings: Not on file  . Marital Status: Not on file     Family History: The patient's family history includes Diabetes in his father; Heart attack in his father; Hyperlipidemia in his father and mother; Hypertension in his father and mother; Stroke in his maternal grandmother. ROS:   Please see the history of present illness.    All other systems reviewed and are negative.  EKGs/Labs/Other Studies Reviewed:    The following studies were reviewed today:  EKG:  EKG  ordered today and personally reviewed.  The ekg ordered today demonstrates sinus rhythm old apical MI low voltage EKG QRS duration now is normal  Recent Labs: No results found for requested labs within last 8760 hours.  Recent Lipid Panel No results found for: CHOL, TRIG, HDL, CHOLHDL, VLDL, LDLCALC, LDLDIRECT  Physical Exam:    VS:  BP 96/68   Pulse 87   SpO2 95%     Wt Readings from Last 3 Encounters:  11/17/18 207 lb (93.9 kg)  09/26/18 207 lb (93.9 kg)  09/22/18 207 lb (93.9 kg)     GEN: No longer appears frail well nourished, well developed in no acute distress HEENT: Normal NECK: No JVD; No carotid bruits LYMPHATICS: No lymphadenopathy CARDIAC: Soft S1 RRR, no murmurs, rubs, gallops RESPIRATORY:  Clear to auscultation without rales, wheezing or rhonchi  ABDOMEN: Soft, non-tender, non-distended MUSCULOSKELETAL:  No edema; No deformity  SKIN: Warm and dry NEUROLOGIC:  Alert and oriented x 3 PSYCHIATRIC:  Normal affect    Signed, Norman Herrlich, MD  08/11/2019 3:30 PM    [facility]

## 2019-08-11 ENCOUNTER — Other Ambulatory Visit: Payer: Self-pay

## 2019-08-11 ENCOUNTER — Encounter: Payer: Self-pay | Admitting: Cardiology

## 2019-08-11 ENCOUNTER — Ambulatory Visit: Payer: PRIVATE HEALTH INSURANCE | Admitting: Cardiology

## 2019-08-11 VITALS — BP 96/68 | HR 87

## 2019-08-11 DIAGNOSIS — I9589 Other hypotension: Secondary | ICD-10-CM | POA: Diagnosis not present

## 2019-08-11 DIAGNOSIS — I5022 Chronic systolic (congestive) heart failure: Secondary | ICD-10-CM | POA: Diagnosis not present

## 2019-08-11 DIAGNOSIS — E039 Hypothyroidism, unspecified: Secondary | ICD-10-CM

## 2019-08-11 DIAGNOSIS — I420 Dilated cardiomyopathy: Secondary | ICD-10-CM | POA: Diagnosis not present

## 2019-08-11 DIAGNOSIS — I739 Peripheral vascular disease, unspecified: Secondary | ICD-10-CM

## 2019-08-11 NOTE — Addendum Note (Signed)
Addended by: Vernard Gambles on: 08/11/2019 03:43 PM   Modules accepted: Orders

## 2019-08-11 NOTE — Patient Instructions (Signed)
Medication Instructions:  Your physician recommends that you continue on your current medications as directed. Please refer to the Current Medication list given to you today.  *If you need a refill on your cardiac medications before your next appointment, please call your pharmacy*  Lab Work: BMP, LIPIDS & Pro BNP on 08/25/2019  If you have labs (blood work) drawn today and your tests are completely normal, you will receive your results only by: Marland Kitchen MyChart Message (if you have MyChart) OR . A paper copy in the mail If you have any lab test that is abnormal or we need to change your treatment, we will call you to review the results.  Testing/Procedures: Your physician has requested that you have a lower or upper extremity venous duplex. This test is an ultrasound of the veins in the legs or arms. It looks at venous blood flow that carries blood from the heart to the legs or arms. Allow one hour for a Lower Venous exam. Allow thirty minutes for an Upper Venous exam. There are no restrictions or special instructions.  Your physician has requested that you have an echocardiogram. Echocardiography is a painless test that uses sound waves to create images of your heart. It provides your doctor with information about the size and shape of your heart and how well your heart's chambers and valves are working. This procedure takes approximately one hour. There are no restrictions for this procedure.    Follow-Up: At [facility], you and your health needs are our priority.  As part of our continuing mission to provide you with exceptional heart care, we have created designated Provider Care Teams.  These Care Teams include your primary Cardiologist (physician) and Advanced Practice Providers (APPs -  Physician Assistants and Nurse Practitioners) who all work together to provide you with the care you need, when you need it.  Your next appointment:   3 month(s)  The format for your next appointment:    In Person  Provider:   Norman Herrlich, MD  Other Instructions None

## 2019-08-12 ENCOUNTER — Other Ambulatory Visit: Payer: Self-pay

## 2019-09-17 ENCOUNTER — Other Ambulatory Visit: Payer: Self-pay | Admitting: Cardiology

## 2019-09-30 ENCOUNTER — Other Ambulatory Visit: Payer: Self-pay

## 2019-09-30 ENCOUNTER — Ambulatory Visit (INDEPENDENT_AMBULATORY_CARE_PROVIDER_SITE_OTHER): Payer: PRIVATE HEALTH INSURANCE

## 2019-09-30 DIAGNOSIS — I739 Peripheral vascular disease, unspecified: Secondary | ICD-10-CM

## 2019-09-30 DIAGNOSIS — I420 Dilated cardiomyopathy: Secondary | ICD-10-CM

## 2019-09-30 DIAGNOSIS — I82411 Acute embolism and thrombosis of right femoral vein: Secondary | ICD-10-CM | POA: Diagnosis not present

## 2019-09-30 NOTE — Progress Notes (Signed)
Complete echocardiogram with contrast has been performed.   Jimmy Katherina Wimer RDCS, RVT 

## 2019-09-30 NOTE — Progress Notes (Signed)
Right lower extremity venous duplex exam has been performed. No evidence of DVT.  Jimmy Baruc Tugwell RDCS, RVT

## 2019-10-01 ENCOUNTER — Telehealth: Payer: Self-pay | Admitting: Cardiology

## 2019-10-01 NOTE — Telephone Encounter (Signed)
  Pt's wife returning [place] call regarding pt's echo results.  Please call

## 2019-10-01 NOTE — Telephone Encounter (Signed)
Returned call to [place] family.  Advised that Pt was ok to wait to discuss ICD at regularly scheduled f/u with Dr. Dulce Sellar.  Family indicates understanding and thanked nurse for call.

## 2019-10-15 ENCOUNTER — Other Ambulatory Visit: Payer: Self-pay

## 2019-10-15 ENCOUNTER — Encounter: Payer: Self-pay | Admitting: Sports Medicine

## 2019-10-15 ENCOUNTER — Ambulatory Visit: Payer: PRIVATE HEALTH INSURANCE | Admitting: Sports Medicine

## 2019-10-15 DIAGNOSIS — G629 Polyneuropathy, unspecified: Secondary | ICD-10-CM

## 2019-10-15 DIAGNOSIS — I739 Peripheral vascular disease, unspecified: Secondary | ICD-10-CM | POA: Diagnosis not present

## 2019-10-15 DIAGNOSIS — B351 Tinea unguium: Secondary | ICD-10-CM | POA: Diagnosis not present

## 2019-10-15 DIAGNOSIS — I872 Venous insufficiency (chronic) (peripheral): Secondary | ICD-10-CM

## 2019-10-15 DIAGNOSIS — Z89512 Acquired absence of left leg below knee: Secondary | ICD-10-CM

## 2019-10-15 DIAGNOSIS — M79674 Pain in right toe(s): Secondary | ICD-10-CM

## 2019-10-15 NOTE — Progress Notes (Signed)
Subjective: Peter Cochran is a 64 y.o. male patient seen today in office with complaint of mildly painful thickened and elongated toenails; unable to trim on the right foot. Patient reports that he is doing okay toes are slowly looking better and he is following closely with vascular and cardiologist currently on Plavix and reports that the cream that was given to him before seems to really help his skin. Patient has no other pedal complaints at this time.   Patient Active Problem List   Diagnosis Date Noted  . Scrotal edema 03/08/2018  . Anemia 03/08/2018  . Chronic hypotension 03/08/2018  . Leukocytosis 03/08/2018  . S/P AKA (above knee amputation) unilateral, left (HCC) 03/07/2018  . Hypothyroidism 03/07/2018  . BKA stump complication (HCC)   . Hx of BKA, left (HCC) 02/28/2018  . Antibiotic long-term use   . AKI (acute kidney injury) (HCC)   . Severe protein-calorie malnutrition (HCC) 12/12/2017  . Bilateral lower leg cellulitis   . Gangrene of toe of left foot (HCC)   . Pressure injury of skin 12/07/2017  . Sepsis due to cellulitis (HCC) 12/06/2017  . Chronic systolic heart failure (HCC) 10/28/2017  . Dilated cardiomyopathy (HCC) 10/27/2017  . Mitral regurgitation 10/27/2017  . Elevated troponin 10/27/2017  . Elevated brain natriuretic peptide (BNP) level 10/27/2017  . PAD (peripheral artery disease) (HCC) 10/27/2017  . 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 Fatigue 10/22/2017    Current Outpatient Medications on File Prior to Visit  Medication Sig Dispense Refill  . aspirin EC 81 MG tablet Take 81 mg by mouth daily.    Marland Kitchen atorvastatin (LIPITOR) 40 MG tablet TAKE ONE (1) TABLET ONCE DAILY 90 tablet 2  . carvedilol (COREG) 3.125 MG tablet TAKE ONE TABLET TWICE DAILY WITH MEALS 180 tablet 3  . clopidogrel (PLAVIX) 75 MG tablet Take 1 tablet (75 mg total) by mouth daily with breakfast. 30 tablet 0  . DULoxetine (CYMBALTA) 60 MG capsule  Take 60 mg by mouth at bedtime.    . furosemide (LASIX) 20 MG tablet TAKE 1 TABLET BY MOUTH EVERY MONDAY, WEDNESDAY, AND FRIDAY 36 tablet 4  . gabapentin (NEURONTIN) 100 MG capsule TAKE 3 CAPSULES 3 TIMES DAILY 90 capsule 11  . levothyroxine (SYNTHROID) 175 MCG tablet Take 175 mcg by mouth daily.    . Multiple Vitamin (MULTIVITAMIN WITH MINERALS) TABS tablet Take 1 tablet by mouth daily. 30 tablet 0  . sacubitril-valsartan (ENTRESTO) 24-26 MG TAKE 1 TABLET BY MOUTH 3 TIMES A WEEK ONMONDAY, WEDNESDAY AND FRIDAY 30 tablet 0  . spironolactone (ALDACTONE) 25 MG tablet TAKE ONE (1) TABLET BY MOUTH ONCE DAILY 90 tablet 3  . traMADol (ULTRAM) 50 MG tablet Take 1 tablet by mouth every 4 (four) hours as needed.    . triamcinolone cream (KENALOG) 0.1 % Apply 1 application topically as needed.     No current facility-administered medications on file prior to visit.    Allergies  Allergen Reactions  . Oxytetracycline Itching, Swelling and Other (See Comments)    Facial swelling  . Sulfa Antibiotics Rash    Objective: Physical Exam  General: Well developed, nourished, no acute distress, awake, alert and oriented x 3.  Patient is assisted by wife's visit.  Vascular: Dorsalis pedis artery 0/4 on right, Posterior tibial artery 0/4 on right skin temperature warm to cool proximal to distal on right, no varicosities, no pedal hair present on the right.  Neurological: Gross sensation present via  light touch on right  Dermatological: Skin is cool, dry, and supple bilateral, Nails 1- 5 on right are tender, long, thick, and discolored with mild subungal debris, no webspace macerations present, no open lesions, dry skin noted on right.  Musculoskeletal: Status post left below the knee amputation Assessment and Plan:  Problem List Items Addressed This Visit      Cardiovascular and Mediastinum   PAD (peripheral artery disease) ([place])    Other Visit Diagnoses    Pain due to onychomycosis of toenail of  right foot    -  Primary   Venous insufficiency (chronic) (peripheral)       Neuropathy       History of left below knee amputation ([place])           -Examined patient.  -Discussed treatment options for painful mycotic nails. -Mechanically debrided and reduced mycotic nails with sterile nail nipper and dremel nail file without incident. -Advised patient to continue closely with cardiac follow-up as well as their recommendation on compression garments -Advised patient to closely monitor her toes and if there are any changes with the toes or any open sores to return to office sooner -Patient to return in 3 months for follow up evaluation or sooner if symptoms worsen.  Landis Martins, DPM

## 2019-11-11 ENCOUNTER — Other Ambulatory Visit: Payer: Self-pay

## 2019-11-12 ENCOUNTER — Other Ambulatory Visit: Payer: Self-pay

## 2019-11-12 ENCOUNTER — Ambulatory Visit (INDEPENDENT_AMBULATORY_CARE_PROVIDER_SITE_OTHER): Payer: PRIVATE HEALTH INSURANCE

## 2019-11-12 ENCOUNTER — Ambulatory Visit: Payer: PRIVATE HEALTH INSURANCE | Admitting: Cardiology

## 2019-11-12 ENCOUNTER — Encounter: Payer: Self-pay | Admitting: Cardiology

## 2019-11-12 VITALS — BP 110/66 | HR 86 | Ht 71.0 in

## 2019-11-12 DIAGNOSIS — I9589 Other hypotension: Secondary | ICD-10-CM

## 2019-11-12 DIAGNOSIS — I739 Peripheral vascular disease, unspecified: Secondary | ICD-10-CM

## 2019-11-12 DIAGNOSIS — E039 Hypothyroidism, unspecified: Secondary | ICD-10-CM

## 2019-11-12 DIAGNOSIS — I420 Dilated cardiomyopathy: Secondary | ICD-10-CM

## 2019-11-12 DIAGNOSIS — E782 Mixed hyperlipidemia: Secondary | ICD-10-CM

## 2019-11-12 DIAGNOSIS — I5022 Chronic systolic (congestive) heart failure: Secondary | ICD-10-CM | POA: Diagnosis not present

## 2019-11-12 DIAGNOSIS — I340 Nonrheumatic mitral (valve) insufficiency: Secondary | ICD-10-CM | POA: Diagnosis not present

## 2019-11-12 MED ORDER — ENTRESTO 24-26 MG PO TABS: ORAL_TABLET | ORAL | 3 refills | Status: DC

## 2019-11-12 MED ORDER — FUROSEMIDE 20 MG PO TABS
20.0000 mg | ORAL_TABLET | Freq: Every day | ORAL | 3 refills | Status: DC
Start: 2019-11-12 — End: 2020-05-20

## 2019-11-12 NOTE — Addendum Note (Signed)
Addended by: Delorse Limber I on: 11/12/2019 01:55 PM   Modules accepted: Orders

## 2019-11-12 NOTE — Progress Notes (Signed)
Cardiology Office Note:    Date:  11/12/2019   ID:  Natasha Mead, DOB [birth year], MRN [id number]  PCP:  Pc, [facility]  Cardiologist:  Shirlee More, MD    Referring MD: Pc, [facility]*    ASSESSMENT:    1. Chronic systolic heart failure ([place])   2. Dilated cardiomyopathy ([place])   3. Chronic hypotension   4. Non-rheumatic mitral regurgitation   5. PAD (peripheral artery disease) ([place])   6. Mixed hyperlipidemia   7. Hypothyroidism, unspecified type    PLAN:    In order of problems listed above:  1. Overall stable mildly decompensated with fluid overload we will increase his loop diuretic daily and continue his other guideline directed treatment with his previous hypotension I would not uptitrate.  I engage the patient and his wife who was present participate discussion decision making about the value of an ICD he declines at this time but agrees to wear a event monitor to screen for ventricular tachycardia.  If present we will have to reengage the discussion and if he declines an ICD I put him on low-dose amiodarone.  Statistically I suspect he has CAD but I think he is a poor candidate for revascularization at this time. 2. Improved stable 3. Stable mitral regurgitation 4. Stable after AKA 5. Continue statin await labs from his primary care physician 6. Continue thyroid supplement await labs from his primary care physician   Next appointment: 6 months   Medication Adjustments/Labs and Tests Ordered: Current medicines are reviewed at length with the patient today.  Concerns regarding medicines are outlined above.  No orders of the defined types were placed in this encounter.  No orders of the defined types were placed in this encounter.   Chief Complaint  Patient presents with  . Follow-up  . Congestive Heart Failure    History of Present Illness:    RYDEN WAINER is a 64 y.o. male with a hx of systolic heart failure and PAD with left AKA   last seen 08/11/2019. Compliance with diet, lifestyle and medications: Yes  He had an echocardiogram performed 09/30/2019 shows the left ventricle to be diffusely hypokinetic ejection fraction 30 to 35% and mild right ventricular dysfunction.  Previously reported February 2020 was 40 to 45%.  He had a lower extremity venous duplex study left leg negative for DVT.  Overall he is doing better strength and endurance improved is not short of breath but he has more edema +1 and we will increase his diuretic daily.  I reviewed with patient and his wife his reduced ejection fraction potential need for an ICD they declined but did agree to undergo screening with 7-day ZIO monitor and if we see nonsustained ventricular tachycardia we will have to repeat the discussion.  On tolerated medical therapy with Entresto MRA reticulocyte.  He informs me he had recent labs 4 to 6 weeks and have requested from his PCP office. Past Medical History:  Diagnosis Date  . AKI (acute kidney injury) ([place])   . Anemia 03/08/2018  . Antibiotic long-term use   . Bilateral lower leg cellulitis   . BKA stump complication ([place])   . Cellulitis    mild.  L leg  . CHF (congestive heart failure) (HCC)    Dr. Shirlee More  . Chronic hypotension 03/08/2018  . Chronic systolic heart failure ([place]) 10/28/2017  . Chronic ulcer of left lower extremity with fat layer exposed ([place]) 10/27/2017  . Complication of anesthesia    '  hard time waking up"  . Difficulty voiding   . Dilated cardiomyopathy (HCC) 10/27/2017  . 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  . Elevated brain natriuretic peptide (BNP) level 10/27/2017  . Elevated troponin 10/27/2017  . Enlarged prostate   . Fatigue 10/22/2017  . Gangrene of toe of left foot (HCC)   . Hand fracture, right    Dr. Linda Hedges  . Hx of BKA, left (HCC) 02/28/2018  . Hypothyroidism 03/07/2018  . Hypothyroidism (acquired)   . Leg ulcer, left (HCC)   . Leukocytosis 03/08/2018  . Mitral regurgitation  10/27/2017  . PAD (peripheral artery disease) (HCC)   . Periorbital hematoma, right   . Peripheral neuropathy   . Pressure injury of skin 12/07/2017  . S/P AKA (above knee amputation) unilateral, left (HCC) 03/07/2018  . Scrotal edema 03/08/2018  . Sepsis due to cellulitis (HCC) 12/06/2017  . Severe protein-calorie malnutrition (HCC) 12/12/2017  . Toe ulcer (HCC)    Left  . Wound of left leg    Non-healing. Dr. Liston Alba, Dr. Bynum Bellows.    Past Surgical History:  Procedure Laterality Date  . AMPUTATION Left 02/28/2018   Procedure: LEFT BELOW KNEE AMPUTATION;  Surgeon: Nadara Mustard, MD;  Location: [facility] OR;  Service: Orthopedics;  Laterality: Left;  . AORTOGRAM Left 12/12/2017   Procedure: AORTOGRAM LEFT LOWER EXTREMITY RUNOFF;  Surgeon: Fransisco Hertz, MD;  Location: [facility] OR;  Service: Vascular;  Laterality: Left;  . BELOW KNEE LEG AMPUTATION Left 02/28/2018  . CYST EXCISION    . PERIPHERAL VASCULAR BALLOON ANGIOPLASTY Left 12/12/2017   Procedure: PERIPHERAL VASCULAR BALLOON ANGIOPLASTY LEFT SFA;  Surgeon: Fransisco Hertz, MD;  Location: [facility] OR;  Service: Vascular;  Laterality: Left;  . PILONIDAL CYST DRAINAGE    . STUMP REVISION Left 03/07/2018   Procedure: REVISION LEFT BELOW KNEE AMPUTATION VS. LEFT ABOVE KNEE AMPUTATION;  Surgeon: Nadara Mustard, MD;  Location: [facility] OR;  Service: Orthopedics;  Laterality: Left;    Current Medications: Current Meds  Medication Sig  . aspirin EC 81 MG tablet Take 81 mg by mouth daily.  Marland Kitchen atorvastatin (LIPITOR) 40 MG tablet TAKE ONE (1) TABLET ONCE DAILY  . carvedilol (COREG) 3.125 MG tablet TAKE ONE TABLET TWICE DAILY WITH MEALS  . clopidogrel (PLAVIX) 75 MG tablet Take 1 tablet (75 mg total) by mouth daily with breakfast.  . DULoxetine (CYMBALTA) 60 MG capsule Take 60 mg by mouth at bedtime.  . gabapentin (NEURONTIN) 100 MG capsule TAKE 3 CAPSULES 3 TIMES DAILY  . levothyroxine (SYNTHROID) 175 MCG tablet Take 175 mcg by mouth daily before  breakfast.  . Multiple Vitamin (MULTIVITAMIN WITH MINERALS) TABS tablet Take 1 tablet by mouth daily.  . sacubitril-valsartan (ENTRESTO) 24-26 MG TAKE 1 TABLET BY MOUTH 3 TIMES A WEEK ONMONDAY, WEDNESDAY AND FRIDAY  . spironolactone (ALDACTONE) 25 MG tablet TAKE ONE (1) TABLET BY MOUTH ONCE DAILY  . triamcinolone cream (KENALOG) 0.1 % Apply 1 application topically as needed.  . [DISCONTINUED] furosemide (LASIX) 20 MG tablet TAKE 1 TABLET BY MOUTH EVERY MONDAY, WEDNESDAY, AND FRIDAY     Allergies:   Oxytetracycline and Sulfa antibiotics   Social History   Socioeconomic History  . Marital status: Married    Spouse name: Not on file  . Number of children: Not on file  . Years of education: Not on file  . Highest education level: Not on file  Occupational History  . Not on file  Tobacco Use  .  Smoking status: Current Every Day Smoker    Packs/day: 1.50    Years: 50.00    Pack years: 75.00    Types: Cigarettes  . Smokeless tobacco: Never Used  . Tobacco comment: 1-1.5 pks per day  Substance and Sexual Activity  . Alcohol use: Not Currently  . Drug use: Not Currently  . Sexual activity: Not on file  Other Topics Concern  . Not on file  Social History Narrative  . Not on file   Social Determinants of Health   Financial Resource Strain:   . Difficulty of Paying Living Expenses:   Food Insecurity:   . Worried About Programme researcher, broadcasting/film/video in the Last Year:   . Barista in the Last Year:   Transportation Needs:   . Freight forwarder (Medical):   Marland Kitchen Lack of Transportation (Non-Medical):   Physical Activity:   . Days of Exercise per Week:   . Minutes of Exercise per Session:   Stress:   . Feeling of Stress :   Social Connections:   . Frequency of Communication with Friends and Family:   . Frequency of Social Gatherings with Friends and Family:   . Attends Religious Services:   . Active Member of Clubs or Organizations:   . Attends Banker Meetings:     Marland Kitchen Marital Status:      Family History: The patient's family history includes Diabetes in his father; Heart attack in his father; Hyperlipidemia in his father and mother; Hypertension in his father and mother; Stroke in his maternal grandmother. ROS:   Please see the history of present illness.    All other systems reviewed and are negative.  EKGs/Labs/Other Studies Reviewed:    The following studies were reviewed today:    Recent Labs: Requested  Physical Exam:    VS:  BP 110/66 (BP Location: Right Arm, Patient Position: Sitting, Cuff Size: Normal)   Pulse 86   Ht 5\' 11"  (1.803 m)   SpO2 92%   BMI 28.87 kg/m     Wt Readings from Last 3 Encounters:  11/17/18 207 lb (93.9 kg)  09/26/18 207 lb (93.9 kg)  09/22/18 207 lb (93.9 kg)     GEN: He appears less chronically ill and frail well nourished, well developed in no acute distress HEENT: Normal NECK: No JVD; No carotid bruits LYMPHATICS: No lymphadenopathy CARDIAC: No S3 RRR, no murmurs, rubs, gallops RESPIRATORY:  Clear to auscultation without rales, wheezing or rhonchi  ABDOMEN: Soft, non-tender, non-distended MUSCULOSKELETAL: He has had a right AKA, +1 left lower extremity pitting to the knee edema; No deformity  SKIN: Warm and dry NEUROLOGIC:  Alert and oriented x 3 PSYCHIATRIC:  Normal affect    Signed, 11/22/18, MD  11/12/2019 1:35 PM    [facility]

## 2019-11-12 NOTE — Patient Instructions (Signed)
Medication Instructions:  Your physician has recommended you make the following change in your medication:  INCREASE: Take 20 mg tablet of furosemide once daily.   *If you need a refill on your cardiac medications before your next appointment, please call your pharmacy*   Lab Work: None If you have labs (blood work) drawn today and your tests are completely normal, you will receive your results only by: Marland Kitchen MyChart Message (if you have MyChart) OR . A paper copy in the mail If you have any lab test that is abnormal or we need to change your treatment, we will call you to review the results.   Testing/Procedures: A zio monitor was ordered today. It will remain on for 7 days. You will then return monitor and event diary in provided box. It takes 1-2 weeks for report to be downloaded and returned to [place]. We will call you with the results. If monitor falls off or has orange flashing light, please call Zio for further instructions.      Follow-Up: At [facility], you and your health needs are our priority.  As part of our continuing mission to provide you with exceptional heart care, we have created designated Provider Care Teams.  These Care Teams include your primary Cardiologist (physician) and Advanced Practice Providers (APPs -  Physician Assistants and Nurse Practitioners) who all work together to provide you with the care you need, when you need it.  We recommend signing up for the patient portal called "MyChart".  Sign up information is provided on this After Visit Summary.  MyChart is used to connect with patients for Virtual Visits (Telemedicine).  Patients are able to view lab/test results, encounter notes, upcoming appointments, etc.  Non-urgent messages can be sent to your provider as well.   To learn more about what you can do with MyChart, go to [email or web address].    Your next appointment:   6 month(s)  The format for your next appointment:   In Person  Provider:    Norman Herrlich, MD   Other Instructions

## 2019-11-21 ENCOUNTER — Other Ambulatory Visit: Payer: Self-pay | Admitting: Cardiology

## 2019-12-31 ENCOUNTER — Other Ambulatory Visit: Payer: Self-pay | Admitting: Cardiology

## 2020-01-15 ENCOUNTER — Ambulatory Visit: Payer: PRIVATE HEALTH INSURANCE | Admitting: Sports Medicine

## 2020-01-15 ENCOUNTER — Encounter: Payer: Self-pay | Admitting: Sports Medicine

## 2020-01-15 ENCOUNTER — Other Ambulatory Visit: Payer: Self-pay

## 2020-01-15 DIAGNOSIS — I739 Peripheral vascular disease, unspecified: Secondary | ICD-10-CM | POA: Diagnosis not present

## 2020-01-15 DIAGNOSIS — G629 Polyneuropathy, unspecified: Secondary | ICD-10-CM

## 2020-01-15 DIAGNOSIS — B351 Tinea unguium: Secondary | ICD-10-CM

## 2020-01-15 DIAGNOSIS — I872 Venous insufficiency (chronic) (peripheral): Secondary | ICD-10-CM

## 2020-01-15 DIAGNOSIS — Z89512 Acquired absence of left leg below knee: Secondary | ICD-10-CM | POA: Diagnosis not present

## 2020-01-15 DIAGNOSIS — M79674 Pain in right toe(s): Secondary | ICD-10-CM

## 2020-01-15 NOTE — Progress Notes (Signed)
Subjective: Peter Cochran is a 64 y.o. male patient seen today in office with complaint of mildly painful thickened and elongated toenails; unable to trim on the right foot. Patient reports that he is doing good with no new concerns or problems since last encounter.  No other pedal complaints noted.  Patient Active Problem List   Diagnosis Date Noted  . Scrotal edema 03/08/2018  . Anemia 03/08/2018  . Chronic hypotension 03/08/2018  . Leukocytosis 03/08/2018  . S/P AKA (above knee amputation) unilateral, left (HCC) 03/07/2018  . Hypothyroidism 03/07/2018  . BKA stump complication (HCC)   . Hx of BKA, left (HCC) 02/28/2018  . Antibiotic long-term use   . AKI (acute kidney injury) (HCC)   . Severe protein-calorie malnutrition (HCC) 12/12/2017  . Bilateral lower leg cellulitis   . Gangrene of toe of left foot (HCC)   . Pressure injury of skin 12/07/2017  . Sepsis due to cellulitis (HCC) 12/06/2017  . Chronic systolic heart failure (HCC) 10/28/2017  . Dilated cardiomyopathy (HCC) 10/27/2017  . Mitral regurgitation 10/27/2017  . Elevated troponin 10/27/2017  . Elevated brain natriuretic peptide (BNP) level 10/27/2017  . PAD (peripheral artery disease) (HCC) 10/27/2017  . 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 Fatigue 10/22/2017    Current Outpatient Medications on File Prior to Visit  Medication Sig Dispense Refill  . aspirin EC 81 MG tablet Take 81 mg by mouth daily.    Marland Kitchen atorvastatin (LIPITOR) 40 MG tablet TAKE ONE (1) TABLET ONCE DAILY 90 tablet 2  . carvedilol (COREG) 3.125 MG tablet TAKE ONE TABLET TWICE DAILY WITH MEALS 180 tablet 3  . clopidogrel (PLAVIX) 75 MG tablet Take 1 tablet (75 mg total) by mouth daily with breakfast. 30 tablet 0  . DULoxetine (CYMBALTA) 60 MG capsule Take 60 mg by mouth at bedtime.    . furosemide (LASIX) 20 MG tablet Take 1 tablet (20 mg total) by mouth daily. 90 tablet 3  . gabapentin (NEURONTIN) 100 MG  capsule TAKE 3 CAPSULES 3 TIMES DAILY 90 capsule 11  . gabapentin (NEURONTIN) 300 MG capsule Take 300 mg by mouth 3 (three) times daily.    Marland Kitchen levothyroxine (SYNTHROID) 175 MCG tablet Take 175 mcg by mouth daily before breakfast.    . Multiple Vitamin (MULTIVITAMIN WITH MINERALS) TABS tablet Take 1 tablet by mouth daily. 30 tablet 0  . sacubitril-valsartan (ENTRESTO) 24-26 MG TAKE 1 TABLET BY MOUTH 3 TIMES A WEEK ON MONDAY, WEDNESDAY AND FRIDAY. NEEDS APPOINTMENT 36 tablet 2  . spironolactone (ALDACTONE) 25 MG tablet TAKE ONE (1) TABLET BY MOUTH ONCE DAILY 90 tablet 3  . triamcinolone cream (KENALOG) 0.1 % Apply 1 application topically as needed.     No current facility-administered medications on file prior to visit.    Allergies  Allergen Reactions  . Oxytetracycline Itching, Swelling and Other (See Comments)    Facial swelling  . Sulfa Antibiotics Rash    Objective: Physical Exam  General: Well developed, nourished, no acute distress, awake, alert and oriented x 3.  Patient is assisted by wife's visit.  Vascular: Dorsalis pedis artery 0/4 on right, Posterior tibial artery 0/4 on right skin temperature warm to cool proximal to distal on right, no varicosities, no pedal hair present on the right.  Neurological: Gross sensation present via light touch on right  Dermatological: Skin is cool, dry, and supple bilateral, Nails 1- 5 on right are tender, long, thick, and discolored with mild subungal  debris, no webspace macerations present, no open lesions, dry skin noted on right leg with no open lesions or concern for acute infection.  Musculoskeletal: Status post left below the knee amputation.  Assessment and Plan:  Problem List Items Addressed This Visit      Cardiovascular and Mediastinum   PAD (peripheral artery disease) (HCC)    Other Visit Diagnoses    Pain due to onychomycosis of toenail of right foot    -  Primary   Venous insufficiency (chronic) (peripheral)        Neuropathy       History of left below knee amputation (HCC)         -Examined patient.  -Discussed treatment options for painful mycotic nails. -Mechanically debrided and reduced mycotic nails with sterile nail nipper and dremel nail file without incident. -Advised patient daily skin emollients and close monitoring of legs for any abrasions or sores if recurs to return to office sooner -Patient to return in 3 months for follow up evaluation or sooner if symptoms worsen.  Asencion Islam, DPM

## 2020-01-31 LAB — COLOGUARD: COLOGUARD: NEGATIVE

## 2020-02-09 ENCOUNTER — Other Ambulatory Visit: Payer: Self-pay | Admitting: Cardiology

## 2020-04-19 ENCOUNTER — Ambulatory Visit: Payer: PRIVATE HEALTH INSURANCE | Admitting: Sports Medicine

## 2020-05-19 NOTE — Progress Notes (Signed)
Cardiology Office Note:    Date:  05/20/2020   ID:  Peter Cochran, DOB 1955-11-13, MRN [id number]  PCP:  [facility]  Cardiologist:  Norman Herrlich, MD    Referring MD: [facility], [facility]*    ASSESSMENT:    1. Chronic systolic heart failure (HCC)   2. Dilated cardiomyopathy (HCC)   3. Mixed hyperlipidemia   4. PAD (peripheral artery disease) (HCC)    PLAN:    In order of problems listed above:  1. He really has done quite well with the severity of his heart disease and the difficulty in getting the mind keeping a line type line directed treatment he is on maximally tolerated dose of carvedilol and Entresto and MRA and I would not try to uptitrate.  I think he needs to have a prescription for a loop diuretic to take as needed and will do that with his wife today.  I accessed [facility].  At this time I would raise the issue of ICD therapy again 2. Stable continue with statin with PAD   Next appointment: I will plan to see him back in 6 months   Medication Adjustments/Labs and Tests Ordered: Current medicines are reviewed at length with the patient today.  Concerns regarding medicines are outlined above.  No orders of the defined types were placed in this encounter.  No orders of the defined types were placed in this encounter.   Chief Complaint  Patient presents with  . Follow-up  . Congestive Heart Failure    History of Present Illness:    Peter Cochran is a 64 y.o. male with a hx of systolic heart failure with severely reduced ejection fraction and peripheral arterial disease with left AKA along with hypotension idiopathic hypothyroidism and hyper lipidemia. He was last seen 11/12/2019. He declined EP consultation for an ICD 7-day ZIO monitor showed no episodes of ventricular tachycardia.  10 known chronic dual antiplatelet therapy with extensive vascular disease and guideline directed treatment with beta-blocker and  Entresto MRA and loop diuretic. Compliance with diet, lifestyle and medications: Yes  He is supervised by his wife who participates in the evaluation decision making  Frequent labs are done through [facility], NP requested today  Overall he is done well he is not short of breath no orthopnea chest pain palpitation or syncope.  He declined consideration for an ICD  He no longer takes a diuretic I think that his wife should have it at home and I gave her instructions to use as needed Past Medical History:  Diagnosis Date  . AKI (acute kidney injury) (HCC)   . Anemia 03/08/2018  . Antibiotic long-term use   . Bilateral lower leg cellulitis   . BKA stump complication (HCC)   . Cellulitis    mild.  L leg  . CHF (congestive heart failure) (HCC)    Dr. Norman Herrlich  . Chronic hypotension 03/08/2018  . Chronic systolic heart failure (HCC) 10/28/2017  . 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 Complication of anesthesia    'hard time waking up"  . Difficulty voiding   . Dilated cardiomyopathy (HCC) 10/27/2017  . 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  . Elevated brain natriuretic peptide (BNP) level 10/27/2017  . Elevated troponin 10/27/2017  . Enlarged prostate   . Fatigue 10/22/2017  . Gangrene of toe of left foot (HCC)   .  Hand fracture, right    Dr. Linda Hedges  . Hx of BKA, left (HCC) 02/28/2018  . Hypothyroidism 03/07/2018  . Hypothyroidism (acquired)   . Leg ulcer, left (HCC)   . Leukocytosis 03/08/2018  . Mitral regurgitation 10/27/2017  . PAD (peripheral artery disease) (HCC)   . Periorbital hematoma, right   . Peripheral neuropathy   . Pressure injury of skin 12/07/2017  . S/P AKA (above knee amputation) unilateral, left (HCC) 03/07/2018  . Scrotal edema 03/08/2018  . Sepsis due to cellulitis (HCC) 12/06/2017  . Severe protein-calorie malnutrition (HCC) 12/12/2017  . Toe ulcer (HCC)    Left  . Wound of left leg     Non-healing. Dr. Liston Alba, Dr. Bynum Bellows.    Past Surgical History:  Procedure Laterality Date  . AMPUTATION Left 02/28/2018   Procedure: LEFT BELOW KNEE AMPUTATION;  Surgeon: Nadara Mustard, MD;  Location: [facility] OR;  Service: Orthopedics;  Laterality: Left;  . AORTOGRAM Left 12/12/2017   Procedure: AORTOGRAM LEFT LOWER EXTREMITY RUNOFF;  Surgeon: Fransisco Hertz, MD;  Location: [facility] OR;  Service: Vascular;  Laterality: Left;  . BELOW KNEE LEG AMPUTATION Left 02/28/2018  . CYST EXCISION    . PERIPHERAL VASCULAR BALLOON ANGIOPLASTY Left 12/12/2017   Procedure: PERIPHERAL VASCULAR BALLOON ANGIOPLASTY LEFT SFA;  Surgeon: Fransisco Hertz, MD;  Location: [facility] OR;  Service: Vascular;  Laterality: Left;  . PILONIDAL CYST DRAINAGE    . STUMP REVISION Left 03/07/2018   Procedure: REVISION LEFT BELOW KNEE AMPUTATION VS. LEFT ABOVE KNEE AMPUTATION;  Surgeon: Nadara Mustard, MD;  Location: [facility] OR;  Service: Orthopedics;  Laterality: Left;    Current Medications: Current Meds  Medication Sig  . aspirin EC 81 MG tablet Take 81 mg by mouth daily.  Marland Kitchen atorvastatin (LIPITOR) 40 MG tablet TAKE ONE (1) TABLET ONCE DAILY  . carvedilol (COREG) 3.125 MG tablet TAKE ONE TABLET TWICE DAILY WITH MEALS  . clopidogrel (PLAVIX) 75 MG tablet Take 1 tablet (75 mg total) by mouth daily with breakfast.  . DULoxetine (CYMBALTA) 60 MG capsule Take 60 mg by mouth at bedtime.  . gabapentin (NEURONTIN) 300 MG capsule Take 300 mg by mouth 3 (three) times daily.  Marland Kitchen levothyroxine (SYNTHROID) 175 MCG tablet Take 175 mcg by mouth daily before breakfast.  . Multiple Vitamin (MULTIVITAMIN WITH MINERALS) TABS tablet Take 1 tablet by mouth daily.  . sacubitril-valsartan (ENTRESTO) 24-26 MG TAKE 1 TABLET BY MOUTH 3 TIMES A WEEK ON MONDAY, WEDNESDAY AND FRIDAY. NEEDS APPOINTMENT  . spironolactone (ALDACTONE) 25 MG tablet TAKE ONE (1) TABLET ONCE DAILY     Allergies:   Oxytetracycline and Sulfa antibiotics   Social History    Socioeconomic History  . Marital status: Married    Spouse name: Not on file  . Number of children: Not on file  . Years of education: Not on file  . Highest education level: Not on file  Occupational History  . Not on file  Tobacco Use  . Smoking status: Current Every Day Smoker    Packs/day: 1.50    Years: 50.00    Pack years: 75.00    Types: Cigarettes  . Smokeless tobacco: Never Used  . Tobacco comment: 1-1.5 pks per day  Vaping Use  . Vaping Use: Never used  Substance and Sexual Activity  . Alcohol use: Not Currently  . Drug use: Not Currently  . Sexual activity: Not on file  Other Topics Concern  . Not on file  Social History  Narrative  . Not on file   Social Determinants of Health   Financial Resource Strain:   . Difficulty of Paying Living Expenses: Not on file  Food Insecurity:   . Worried About Programme researcher, broadcasting/film/video in the Last Year: Not on file  . Ran Out of Food in the Last Year: Not on file  Transportation Needs:   . Lack of Transportation (Medical): Not on file  . Lack of Transportation (Non-Medical): Not on file  Physical Activity:   . Days of Exercise per Week: Not on file  . Minutes of Exercise per Session: Not on file  Stress:   . Feeling of Stress : Not on file  Social Connections:   . Frequency of Communication with Friends and Family: Not on file  . Frequency of Social Gatherings with Friends and Family: Not on file  . Attends Religious Services: Not on file  . Active Member of Clubs or Organizations: Not on file  . Attends Banker Meetings: Not on file  . Marital Status: Not on file     Family History: The patient's family history includes Diabetes in his father; Heart attack in his father; Hyperlipidemia in his father and mother; Hypertension in his father and mother; Stroke in his maternal grandmother. ROS:   Please see the history of present illness.    All other systems reviewed and are negative.  EKGs/Labs/Other  Studies Reviewed:    The following studies were reviewed today:    Recent Labs: No results found for requested labs within last 8760 hours.  Recent Lipid Panel No results found for: CHOL, TRIG, HDL, CHOLHDL, VLDL, LDLCALC, LDLDIRECT  Physical Exam:    VS:  BP 103/69   Pulse (!) 110   Ht 5\' 11"  (1.803 m)   SpO2 93%   BMI 28.87 kg/m     Wt Readings from Last 3 Encounters:  11/17/18 207 lb (93.9 kg)  09/26/18 207 lb (93.9 kg)  09/22/18 207 lb (93.9 kg)     GEN: He continues to improve well nourished, well developed in no acute distress HEENT: Normal NECK: No JVD; No carotid bruits LYMPHATICS: No lymphadenopathy CARDIAC: RRR, no murmurs, rubs, gallops RESPIRATORY:  Clear to auscultation without rales, wheezing or rhonchi  ABDOMEN: Soft, non-tender, non-distended MUSCULOSKELETAL:  No edema; No deformity  SKIN: Warm and dry NEUROLOGIC:  Alert and oriented x 3 PSYCHIATRIC:  Normal affect    Signed, 11/22/18, MD  05/20/2020 1:40 PM    [facility]

## 2020-05-20 ENCOUNTER — Other Ambulatory Visit: Payer: Self-pay

## 2020-05-20 ENCOUNTER — Encounter: Payer: Self-pay | Admitting: Cardiology

## 2020-05-20 ENCOUNTER — Telehealth: Payer: Self-pay

## 2020-05-20 ENCOUNTER — Ambulatory Visit: Payer: PRIVATE HEALTH INSURANCE | Admitting: Cardiology

## 2020-05-20 VITALS — BP 103/69 | HR 110 | Ht 71.0 in

## 2020-05-20 DIAGNOSIS — I739 Peripheral vascular disease, unspecified: Secondary | ICD-10-CM | POA: Diagnosis not present

## 2020-05-20 DIAGNOSIS — S6291XA Unspecified fracture of right wrist and hand, initial encounter for closed fracture: Secondary | ICD-10-CM | POA: Insufficient documentation

## 2020-05-20 DIAGNOSIS — S81802A Unspecified open wound, left lower leg, initial encounter: Secondary | ICD-10-CM | POA: Insufficient documentation

## 2020-05-20 DIAGNOSIS — I5022 Chronic systolic (congestive) heart failure: Secondary | ICD-10-CM | POA: Diagnosis not present

## 2020-05-20 DIAGNOSIS — I420 Dilated cardiomyopathy: Secondary | ICD-10-CM

## 2020-05-20 DIAGNOSIS — L0390 Cellulitis, unspecified: Secondary | ICD-10-CM | POA: Insufficient documentation

## 2020-05-20 DIAGNOSIS — I96 Gangrene, not elsewhere classified: Secondary | ICD-10-CM | POA: Insufficient documentation

## 2020-05-20 DIAGNOSIS — L97929 Non-pressure chronic ulcer of unspecified part of left lower leg with unspecified severity: Secondary | ICD-10-CM | POA: Insufficient documentation

## 2020-05-20 DIAGNOSIS — I509 Heart failure, unspecified: Secondary | ICD-10-CM | POA: Insufficient documentation

## 2020-05-20 DIAGNOSIS — T8859XA Other complications of anesthesia, initial encounter: Secondary | ICD-10-CM | POA: Insufficient documentation

## 2020-05-20 DIAGNOSIS — R39198 Other difficulties with micturition: Secondary | ICD-10-CM | POA: Insufficient documentation

## 2020-05-20 DIAGNOSIS — N400 Enlarged prostate without lower urinary tract symptoms: Secondary | ICD-10-CM | POA: Insufficient documentation

## 2020-05-20 DIAGNOSIS — G629 Polyneuropathy, unspecified: Secondary | ICD-10-CM | POA: Insufficient documentation

## 2020-05-20 DIAGNOSIS — L97509 Non-pressure chronic ulcer of other part of unspecified foot with unspecified severity: Secondary | ICD-10-CM | POA: Insufficient documentation

## 2020-05-20 DIAGNOSIS — H05231 Hemorrhage of right orbit: Secondary | ICD-10-CM | POA: Insufficient documentation

## 2020-05-20 DIAGNOSIS — E039 Hypothyroidism, unspecified: Secondary | ICD-10-CM | POA: Insufficient documentation

## 2020-05-20 DIAGNOSIS — E782 Mixed hyperlipidemia: Secondary | ICD-10-CM

## 2020-05-20 MED ORDER — FUROSEMIDE 20 MG PO TABS: 20.0000 mg | ORAL_TABLET | Freq: Every day | ORAL | 3 refills | Status: DC | PRN

## 2020-05-20 NOTE — Telephone Encounter (Signed)
2 bottles of Entresto 24-26 mg given to pt per Dr. Dulce Sellar. Lot # [id number]  Exp: 8/23

## 2020-05-20 NOTE — Patient Instructions (Signed)
Medication Instructions:  Your physician has recommended you make the following change in your medication:   Take furosemide 20 mg daily as needed.  *If you need a refill on your cardiac medications before your next appointment, please call your pharmacy*   Lab Work: None ordered If you have labs (blood work) drawn today and your tests are completely normal, you will receive your results only by: Marland Kitchen MyChart Message (if you have MyChart) OR . A paper copy in the mail If you have any lab test that is abnormal or we need to change your treatment, we will call you to review the results.   Testing/Procedures: None ordered   Follow-Up: At [facility], you and your health needs are our priority.  As part of our continuing mission to provide you with exceptional heart care, we have created designated Provider Care Teams.  These Care Teams include your primary Cardiologist (physician) and Advanced Practice Providers (APPs -  Physician Assistants and Nurse Practitioners) who all work together to provide you with the care you need, when you need it.  We recommend signing up for the patient portal called "MyChart".  Sign up information is provided on this After Visit Summary.  MyChart is used to connect with patients for Virtual Visits (Telemedicine).  Patients are able to view lab/test results, encounter notes, upcoming appointments, etc.  Non-urgent messages can be sent to your provider as well.   To learn more about what you can do with MyChart, go to [email or web address].    Your next appointment:   6 month(s)  The format for your next appointment:   In Person  Provider:   Norman Herrlich, MD   Other Instructions NA

## 2020-08-12 ENCOUNTER — Ambulatory Visit: Payer: PRIVATE HEALTH INSURANCE | Admitting: Sports Medicine

## 2020-08-12 ENCOUNTER — Encounter: Payer: Self-pay | Admitting: Sports Medicine

## 2020-08-12 ENCOUNTER — Other Ambulatory Visit: Payer: Self-pay

## 2020-08-12 DIAGNOSIS — M79674 Pain in right toe(s): Secondary | ICD-10-CM

## 2020-08-12 DIAGNOSIS — G629 Polyneuropathy, unspecified: Secondary | ICD-10-CM

## 2020-08-12 DIAGNOSIS — Z89512 Acquired absence of left leg below knee: Secondary | ICD-10-CM

## 2020-08-12 DIAGNOSIS — I739 Peripheral vascular disease, unspecified: Secondary | ICD-10-CM

## 2020-08-12 DIAGNOSIS — B351 Tinea unguium: Secondary | ICD-10-CM | POA: Diagnosis not present

## 2020-08-12 DIAGNOSIS — I872 Venous insufficiency (chronic) (peripheral): Secondary | ICD-10-CM

## 2020-08-12 NOTE — Progress Notes (Signed)
Subjective: Peter Cochran is a 65 y.o. male patient seen today in office with complaint of mildly painful thickened and elongated toenails; unable to trim on the right foot. Patient reports that he is doing good with no new issues since last encounter except dry skin.  No other pedal complaints noted.  Patient Active Problem List   Diagnosis Date Noted  . Wound of left leg   . Toe ulcer (HCC)   . Peripheral neuropathy   . Periorbital hematoma, right   . Leg ulcer, left (HCC)   . Hypothyroidism (acquired)   . Hand fracture, right   . Enlarged prostate   . Dry gangrene (HCC)   . Difficulty voiding   . Complication of anesthesia   . Cellulitis   . CHF (congestive heart failure) (HCC)   . Scrotal edema 03/08/2018  . Anemia 03/08/2018  . Chronic hypotension 03/08/2018  . Leukocytosis 03/08/2018  . S/P AKA (above knee amputation) unilateral, left (HCC) 03/07/2018  . Hypothyroidism 03/07/2018  . BKA stump complication (HCC)   . Hx of BKA, left (HCC) 02/28/2018  . Antibiotic long-term use   . AKI (acute kidney injury) (HCC)   . Severe protein-calorie malnutrition (HCC) 12/12/2017  . Bilateral lower leg cellulitis   . Gangrene of toe of left foot (HCC)   . Pressure injury of skin 12/07/2017  . Sepsis due to cellulitis (HCC) 12/06/2017  . Chronic systolic heart failure (HCC) 10/28/2017  . Dilated cardiomyopathy (HCC) 10/27/2017  . Mitral regurgitation 10/27/2017  . Elevated troponin 10/27/2017  . Elevated brain natriuretic peptide (BNP) level 10/27/2017  . PAD (peripheral artery disease) (HCC) 10/27/2017  . 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 Fatigue 10/22/2017    Current Outpatient Medications on File Prior to Visit  Medication Sig Dispense Refill  . aspirin EC 81 MG tablet Take 81 mg by mouth daily.    Marland Kitchen atorvastatin (LIPITOR) 40 MG tablet TAKE ONE (1) TABLET ONCE DAILY 90 tablet 2  . carvedilol (COREG) 3.125 MG tablet TAKE ONE  TABLET TWICE DAILY WITH MEALS 180 tablet 3  . clopidogrel (PLAVIX) 75 MG tablet Take 1 tablet (75 mg total) by mouth daily with breakfast. 30 tablet 0  . DULoxetine (CYMBALTA) 60 MG capsule Take 60 mg by mouth at bedtime.    . furosemide (LASIX) 20 MG tablet Take 1 tablet (20 mg total) by mouth daily as needed. 60 tablet 3  . gabapentin (NEURONTIN) 300 MG capsule Take 300 mg by mouth 3 (three) times daily.    Marland Kitchen levothyroxine (SYNTHROID) 175 MCG tablet Take 175 mcg by mouth daily before breakfast.    . Multiple Vitamin (MULTIVITAMIN WITH MINERALS) TABS tablet Take 1 tablet by mouth daily. 30 tablet 0  . sacubitril-valsartan (ENTRESTO) 24-26 MG TAKE 1 TABLET BY MOUTH 3 TIMES A WEEK ON MONDAY, WEDNESDAY AND FRIDAY. NEEDS APPOINTMENT 36 tablet 2  . spironolactone (ALDACTONE) 25 MG tablet TAKE ONE (1) TABLET ONCE DAILY 90 tablet 2   No current facility-administered medications on file prior to visit.    Allergies  Allergen Reactions  . Oxytetracycline Itching, Swelling and Other (See Comments)    Facial swelling  . Sulfa Antibiotics Rash    Objective: Physical Exam  General: Well developed, nourished, no acute distress, awake, alert and oriented x 3.  Patient is assisted by wife's visit who will also be seen this visit.  Vascular: Dorsalis pedis artery 0/4 on right, Posterior tibial artery 0/4 on  right skin temperature warm to cool proximal to distal on right, no varicosities, no pedal hair present on the right.  Neurological: Gross sensation present via light touch on right  Dermatological: Skin is cool, dry, and supple bilateral, Nails 1- 5 on right are tender, long, thick, and discolored with mild subungal debris, no webspace macerations present, no open lesions, dry skin noted on right leg with no open lesions or concern for acute infection.  Musculoskeletal: Status post left below the knee amputation.  Assessment and Plan:  Problem List Items Addressed This Visit       Cardiovascular and Mediastinum   PAD (peripheral artery disease) (HCC)    Other Visit Diagnoses    Pain due to onychomycosis of toenail of right foot    -  Primary   Venous insufficiency (chronic) (peripheral)       Neuropathy       History of left below knee amputation (HCC)         -Examined patient.  -Re-Discussed treatment options for painful mycotic nails. -Mechanically debrided and reduced mycotic nails with sterile nail nipper and dremel nail file without incident. -Advised patient daily skin emollients and close monitoring of legs like previous and continue with compression sock as prescribed by previous doctor to assist with venous return -Patient to return in 3 months for follow up evaluation or sooner if symptoms worsen.  Asencion Islam, DPM

## 2020-09-29 ENCOUNTER — Other Ambulatory Visit: Payer: Self-pay | Admitting: Cardiology

## 2020-11-09 ENCOUNTER — Encounter: Payer: Self-pay | Admitting: Sports Medicine

## 2020-11-09 ENCOUNTER — Ambulatory Visit: Payer: PRIVATE HEALTH INSURANCE | Admitting: Sports Medicine

## 2020-11-09 ENCOUNTER — Other Ambulatory Visit: Payer: Self-pay

## 2020-11-09 DIAGNOSIS — B351 Tinea unguium: Secondary | ICD-10-CM

## 2020-11-09 DIAGNOSIS — Z89512 Acquired absence of left leg below knee: Secondary | ICD-10-CM

## 2020-11-09 DIAGNOSIS — G629 Polyneuropathy, unspecified: Secondary | ICD-10-CM

## 2020-11-09 DIAGNOSIS — I739 Peripheral vascular disease, unspecified: Secondary | ICD-10-CM

## 2020-11-09 DIAGNOSIS — I872 Venous insufficiency (chronic) (peripheral): Secondary | ICD-10-CM | POA: Diagnosis not present

## 2020-11-09 DIAGNOSIS — M79674 Pain in right toe(s): Secondary | ICD-10-CM | POA: Diagnosis not present

## 2020-11-09 NOTE — Progress Notes (Signed)
Subjective: Peter Cochran is a 65 y.o. male patient seen today in office with complaint of mildly painful thickened and elongated toenails; unable to trim on the right foot. Patient reports that he is doing good with no new issues however wife is concerned about the dry skin on the toes and reports that the patient will not allow her to put cream on.  No other issues noted.  Patient Active Problem List   Diagnosis Date Noted  . Wound of left leg   . Toe ulcer (HCC)   . Peripheral neuropathy   . Periorbital hematoma, right   . Leg ulcer, left (HCC)   . Hypothyroidism (acquired)   . Hand fracture, right   . Enlarged prostate   . Dry gangrene (HCC)   . Difficulty voiding   . Complication of anesthesia   . Cellulitis   . CHF (congestive heart failure) (HCC)   . Scrotal edema 03/08/2018  . Anemia 03/08/2018  . Chronic hypotension 03/08/2018  . Leukocytosis 03/08/2018  . S/P AKA (above knee amputation) unilateral, left (HCC) 03/07/2018  . Hypothyroidism 03/07/2018  . BKA stump complication (HCC)   . Hx of BKA, left (HCC) 02/28/2018  . Antibiotic long-term use   . AKI (acute kidney injury) (HCC)   . Severe protein-calorie malnutrition (HCC) 12/12/2017  . Bilateral lower leg cellulitis   . Gangrene of toe of left foot (HCC)   . Pressure injury of skin 12/07/2017  . Sepsis due to cellulitis (HCC) 12/06/2017  . Chronic systolic heart failure (HCC) 10/28/2017  . Dilated cardiomyopathy (HCC) 10/27/2017  . Mitral regurgitation 10/27/2017  . Elevated troponin 10/27/2017  . Elevated brain natriuretic peptide (BNP) level 10/27/2017  . PAD (peripheral artery disease) (HCC) 10/27/2017  . 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 Fatigue 10/22/2017    Current Outpatient Medications on File Prior to Visit  Medication Sig Dispense Refill  . aspirin EC 81 MG tablet Take 81 mg by mouth daily.    Marland Kitchen atorvastatin (LIPITOR) 40 MG tablet Take 1 tablet (40 mg  total) by mouth daily. 90 tablet 1  . carvedilol (COREG) 3.125 MG tablet TAKE ONE TABLET TWICE DAILY WITH MEALS 180 tablet 3  . clopidogrel (PLAVIX) 75 MG tablet Take 1 tablet (75 mg total) by mouth daily with breakfast. 30 tablet 0  . DULoxetine (CYMBALTA) 60 MG capsule Take 60 mg by mouth at bedtime.    . furosemide (LASIX) 20 MG tablet Take 1 tablet (20 mg total) by mouth daily as needed. 60 tablet 3  . gabapentin (NEURONTIN) 300 MG capsule Take 300 mg by mouth 3 (three) times daily.    Marland Kitchen levothyroxine (SYNTHROID) 175 MCG tablet Take 175 mcg by mouth daily before breakfast.    . Multiple Vitamin (MULTIVITAMIN WITH MINERALS) TABS tablet Take 1 tablet by mouth daily. 30 tablet 0  . sacubitril-valsartan (ENTRESTO) 24-26 MG TAKE 1 TABLET BY MOUTH 3 TIMES A WEEK ON MONDAY, WEDNESDAY AND FRIDAY. NEEDS APPOINTMENT 36 tablet 2  . spironolactone (ALDACTONE) 25 MG tablet TAKE ONE (1) TABLET ONCE DAILY 90 tablet 2   No current facility-administered medications on file prior to visit.    Allergies  Allergen Reactions  . Oxytetracycline Itching, Swelling and Other (See Comments)    Facial swelling  . Sulfa Antibiotics Rash    Objective: Physical Exam  General: Well developed, nourished, no acute distress, awake, alert and oriented x 3.  Patient is assisted by wife's visit  who will also be seen this visit.  Vascular: Dorsalis pedis artery 0/4 on right, Posterior tibial artery 0/4 on right skin temperature warm to cool proximal to distal on right, no varicosities, no pedal hair present on the right.  Neurological: Gross sensation present via light touch on right  Dermatological: Skin is cool, dry, and supple bilateral, Nails 1- 5 on right are tender, long, thick, and discolored with mild subungal debris, no webspace macerations present, no open lesions, dry skin noted on right leg with no open lesions or concern for acute infection.  Musculoskeletal: Status post left below the knee  amputation.  Assessment and Plan:  Problem List Items Addressed This Visit      Cardiovascular and Mediastinum   PAD (peripheral artery disease) (HCC)    Other Visit Diagnoses    Pain due to onychomycosis of toenail of right foot    -  Primary   Venous insufficiency (chronic) (peripheral)       Neuropathy       History of left below knee amputation (HCC)         -Examined patient.  -Re-Discussed treatment options for painful mycotic nails. -Mechanically debrided and reduced mycotic nails with sterile nail nipper and dremel nail file without incident. -Advised patient daily skin emollients and close monitoring of legs like previous and continue with compression sock as prescribed by previous doctor to assist with venous return; advised patient to allow wife to use a little bit of Vaseline or cortisone cream with some skin that is itchy on the right lower extremity 2 times weekly -Patient to return in 3 months for follow up evaluation or sooner if symptoms worsen.  Asencion Islam, DPM

## 2020-11-15 ENCOUNTER — Other Ambulatory Visit: Payer: Self-pay | Admitting: Cardiology

## 2020-11-29 ENCOUNTER — Other Ambulatory Visit: Payer: Self-pay | Admitting: Cardiology

## 2020-12-12 NOTE — Progress Notes (Signed)
Cardiology Office Note:    Date:  12/13/2020   ID:  Peter Cochran, DOB February 20, 1956, MRN [id number]  PCP:  [facility]  Cardiologist:  Norman Herrlich, MD    Referring MD: [facility], [facility]*    ASSESSMENT:    1. Chronic systolic heart failure (HCC)   2. Chronic hypotension   3. Mixed hyperlipidemia   4. PAD (peripheral artery disease) (HCC)    PLAN:    In order of problems listed above:  1. Is clinically stable near card association class I continue low-dose loop diuretic MRA and minimum dose of beta-blocker and Entresto for class effect we have been limited by hypotension.  He will initiate of SGLT2 inhibitor 2. Improved he is no longer on midodrine 3. Continue statin with PAD 4. Stable PAD   Next appointment: 6 months   Medication Adjustments/Labs and Tests Ordered: Current medicines are reviewed at length with the patient today.  Concerns regarding medicines are outlined above.  Orders Placed This Encounter  Procedures  . Comprehensive metabolic panel  . Pro b natriuretic peptide (BNP)  . Lipid panel   Meds ordered this encounter  Medications  . dapagliflozin propanediol (FARXIGA) 10 MG TABS tablet    Sig: Take 1 tablet (10 mg total) by mouth daily before breakfast.    Dispense:  90 tablet    Refill:  3    Chief Complaint  Patient presents with  . Follow-up  . Congestive Heart Failure    History of Present Illness:    Peter Cochran is a 65 y.o. male with a hx of chronic systolic heart failure with severely reduced ejection fraction, PAD with a left above-knee amputation, hyperlipidemia idiopathic hypotension last seen 05/20/2020.  He has declined consideration of ICD and EP consultation.  His last echocardiogram 09/30/2019 shows EF of 30 to 35% with global hypokinesia.  Compliance with diet, lifestyle and medications: Yes  He and his wife are pleased with his quality of life.  No edema shortness of breath chest pain palpitation or  syncope. His hypotension is improved and he tolerates a minimum dose of beta-blocker and Entresto with his diuretic.   We discussed SGLT2 inhibitor and will initiate. And declined EP evaluation for ICD  Past Medical History:  Diagnosis Date  . AKI (acute kidney injury) (HCC)   . Anemia 03/08/2018  . Antibiotic long-term use   . Bilateral lower leg cellulitis   . BKA stump complication (HCC)   . Cellulitis    mild.  L leg  . CHF (congestive heart failure) (HCC)    Dr. Norman Herrlich  . Chronic hypotension 03/08/2018  . Chronic systolic heart failure (HCC) 10/28/2017  . 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 Complication of anesthesia    'hard time waking up"  . Difficulty voiding   . Dilated cardiomyopathy (HCC) 10/27/2017  . 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  . Elevated brain natriuretic peptide (BNP) level 10/27/2017  . Elevated troponin 10/27/2017  . Enlarged prostate   . Fatigue 10/22/2017  . Gangrene of toe of left foot (HCC)   . Hand fracture, right    Dr. Linda Hedges  . Hx of BKA, left (HCC) 02/28/2018  . Hypothyroidism 03/07/2018  . Hypothyroidism (acquired)   . Leg ulcer, left (HCC)   . Leukocytosis 03/08/2018  . Mitral regurgitation 10/27/2017  . PAD (peripheral artery disease) (HCC)   . Periorbital hematoma, right   .  Peripheral neuropathy   . Pressure injury of skin 12/07/2017  . S/P AKA (above knee amputation) unilateral, left (HCC) 03/07/2018  . Scrotal edema 03/08/2018  . Sepsis due to cellulitis (HCC) 12/06/2017  . Severe protein-calorie malnutrition (HCC) 12/12/2017  . Toe ulcer (HCC)    Left  . Wound of left leg    Non-healing. Dr. Liston Alba, Dr. Bynum Bellows.    Past Surgical History:  Procedure Laterality Date  . AMPUTATION Left 02/28/2018   Procedure: LEFT BELOW KNEE AMPUTATION;  Surgeon: Nadara Mustard, MD;  Location: [facility] OR;  Service: Orthopedics;  Laterality: Left;  . AORTOGRAM Left 12/12/2017    Procedure: AORTOGRAM LEFT LOWER EXTREMITY RUNOFF;  Surgeon: Fransisco Hertz, MD;  Location: [facility] OR;  Service: Vascular;  Laterality: Left;  . BELOW KNEE LEG AMPUTATION Left 02/28/2018  . CYST EXCISION    . PERIPHERAL VASCULAR BALLOON ANGIOPLASTY Left 12/12/2017   Procedure: PERIPHERAL VASCULAR BALLOON ANGIOPLASTY LEFT SFA;  Surgeon: Fransisco Hertz, MD;  Location: [facility] OR;  Service: Vascular;  Laterality: Left;  . PILONIDAL CYST DRAINAGE    . STUMP REVISION Left 03/07/2018   Procedure: REVISION LEFT BELOW KNEE AMPUTATION VS. LEFT ABOVE KNEE AMPUTATION;  Surgeon: Nadara Mustard, MD;  Location: [facility] OR;  Service: Orthopedics;  Laterality: Left;    Current Medications: Current Meds  Medication Sig  . aspirin EC 81 MG tablet Take 81 mg by mouth daily.  Marland Kitchen atorvastatin (LIPITOR) 40 MG tablet Take 1 tablet (40 mg total) by mouth daily.  . carvedilol (COREG) 3.125 MG tablet TAKE ONE TABLET TWICE DAILY WITH MEALS  . clopidogrel (PLAVIX) 75 MG tablet Take 1 tablet (75 mg total) by mouth daily with breakfast.  . dapagliflozin propanediol (FARXIGA) 10 MG TABS tablet Take 1 tablet (10 mg total) by mouth daily before breakfast.  . DULoxetine (CYMBALTA) 60 MG capsule Take 60 mg by mouth at bedtime.  . furosemide (LASIX) 20 MG tablet Take 20 mg by mouth daily.  Marland Kitchen gabapentin (NEURONTIN) 300 MG capsule Take 300 mg by mouth 3 (three) times daily.  Marland Kitchen levothyroxine (SYNTHROID) 175 MCG tablet Take 175 mcg by mouth daily before breakfast.  . Multiple Vitamin (MULTIVITAMIN WITH MINERALS) TABS tablet Take 1 tablet by mouth daily.  . sacubitril-valsartan (ENTRESTO) 24-26 MG TAKE 1 TABLET BY MOUTH 3 TIMES A WEEK ON MONDAY, WEDNESDAY AND FRIDAY. NEEDS APPOINTMENT  . spironolactone (ALDACTONE) 25 MG tablet TAKE ONE (1) TABLET ONCE DAILY     Allergies:   Oxytetracycline and Sulfa antibiotics   Social History   Socioeconomic History  . Marital status: Married    Spouse name: Not on file  . Number of children: Not on file   . Years of education: Not on file  . Highest education level: Not on file  Occupational History  . Not on file  Tobacco Use  . Smoking status: Current Every Day Smoker    Packs/day: 1.50    Years: 50.00    Pack years: 75.00    Types: Cigarettes  . Smokeless tobacco: Never Used  . Tobacco comment: 1-1.5 pks per day  Vaping Use  . Vaping Use: Never used  Substance and Sexual Activity  . Alcohol use: Not Currently  . Drug use: Not Currently  . Sexual activity: Not on file  Other Topics Concern  . Not on file  Social History Narrative  . Not on file   Social Determinants of Health   Financial Resource Strain: Not on file  Food  Insecurity: Not on file  Transportation Needs: Not on file  Physical Activity: Not on file  Stress: Not on file  Social Connections: Not on file     Family History: The patient's family history includes Diabetes in his father; Heart attack in his father; Hyperlipidemia in his father and mother; Hypertension in his father and mother; Stroke in his maternal grandmother. ROS:   Please see the history of present illness.    All other systems reviewed and are negative.  EKGs/Labs/Other Studies Reviewed:    The following studies were reviewed today:  EKG:  EKG ordered today and personally reviewed.  The ekg ordered today demonstrates sinus tachycardia left axis deviation possible old inferior MI  Recent Labs: 07/13/2020: Hemoglobin 14.5 hematocrit 43.5 Creatinine 0.65 potassium 4.2 sodium 141 Cholesterol 114 LDL 56 triglycerides 123 HDL 36  Physical Exam:    VS:  BP 110/62 (BP Location: Right Arm, Patient Position: Sitting, Cuff Size: Normal)   Pulse 97   Ht 5\' 11"  (1.803 m)   SpO2 94%   BMI 28.87 kg/m     Wt Readings from Last 3 Encounters:  11/17/18 207 lb (93.9 kg)  09/26/18 207 lb (93.9 kg)  09/22/18 207 lb (93.9 kg)     GEN:  Well nourished, well developed in no acute distress HEENT: Normal NECK: No JVD; No carotid  bruits LYMPHATICS: No lymphadenopathy CARDIAC: RRR, no murmurs, rubs, gallops RESPIRATORY:  Clear to auscultation without rales, wheezing or rhonchi  ABDOMEN: Soft, non-tender, non-distended MUSCULOSKELETAL:  No edema; No deformity  SKIN: Warm and dry NEUROLOGIC:  Alert and oriented x 3 PSYCHIATRIC:  Normal affect    Signed, 11/22/18, MD  12/13/2020 11:08 AM    [facility]

## 2020-12-13 ENCOUNTER — Encounter: Payer: Self-pay | Admitting: Cardiology

## 2020-12-13 ENCOUNTER — Other Ambulatory Visit: Payer: Self-pay

## 2020-12-13 ENCOUNTER — Ambulatory Visit: Payer: PRIVATE HEALTH INSURANCE | Admitting: Cardiology

## 2020-12-13 VITALS — BP 110/62 | HR 97 | Ht 71.0 in

## 2020-12-13 DIAGNOSIS — I739 Peripheral vascular disease, unspecified: Secondary | ICD-10-CM | POA: Diagnosis not present

## 2020-12-13 DIAGNOSIS — I5022 Chronic systolic (congestive) heart failure: Secondary | ICD-10-CM | POA: Diagnosis not present

## 2020-12-13 DIAGNOSIS — E782 Mixed hyperlipidemia: Secondary | ICD-10-CM

## 2020-12-13 DIAGNOSIS — I9589 Other hypotension: Secondary | ICD-10-CM | POA: Diagnosis not present

## 2020-12-13 MED ORDER — DAPAGLIFLOZIN PROPANEDIOL 10 MG PO TABS: 10.0000 mg | ORAL_TABLET | Freq: Every day | ORAL | 3 refills | Status: DC

## 2020-12-13 NOTE — Addendum Note (Signed)
Addended by: Roosvelt Harps R on: 12/13/2020 11:34 AM   Modules accepted: Orders

## 2020-12-13 NOTE — Patient Instructions (Signed)
Medication Instructions:  Your physician has recommended you make the following change in your medication:  START: Farxiga 10 mg take one tablet by mouth daily.  *If you need a refill on your cardiac medications before your next appointment, please call your pharmacy*   Lab Work: Your physician recommends that you return for lab work in: TODAY CMP, lipids, ProBNP If you have labs (blood work) drawn today and your tests are completely normal, you will receive your results only by: Marland Kitchen MyChart Message (if you have MyChart) OR . A paper copy in the mail If you have any lab test that is abnormal or we need to change your treatment, we will call you to review the results.   Testing/Procedures: None   Follow-Up: At [facility], you and your health needs are our priority.  As part of our continuing mission to provide you with exceptional heart care, we have created designated Provider Care Teams.  These Care Teams include your primary Cardiologist (physician) and Advanced Practice Providers (APPs -  Physician Assistants and Nurse Practitioners) who all work together to provide you with the care you need, when you need it.  We recommend signing up for the patient portal called "MyChart".  Sign up information is provided on this After Visit Summary.  MyChart is used to connect with patients for Virtual Visits (Telemedicine).  Patients are able to view lab/test results, encounter notes, upcoming appointments, etc.  Non-urgent messages can be sent to your provider as well.   To learn more about what you can do with MyChart, go to [email or web address].    Your next appointment:   6 month(s)  The format for your next appointment:   In Person  Provider:   Norman Herrlich, MD   Other Instructions

## 2021-01-10 ENCOUNTER — Other Ambulatory Visit: Payer: Self-pay | Admitting: Cardiology

## 2021-02-08 ENCOUNTER — Ambulatory Visit: Payer: PRIVATE HEALTH INSURANCE | Admitting: Sports Medicine

## 2021-02-10 ENCOUNTER — Other Ambulatory Visit: Payer: Self-pay

## 2021-02-10 ENCOUNTER — Encounter: Payer: Self-pay | Admitting: Sports Medicine

## 2021-02-10 ENCOUNTER — Ambulatory Visit: Payer: PRIVATE HEALTH INSURANCE | Admitting: Sports Medicine

## 2021-02-10 DIAGNOSIS — I872 Venous insufficiency (chronic) (peripheral): Secondary | ICD-10-CM

## 2021-02-10 DIAGNOSIS — G629 Polyneuropathy, unspecified: Secondary | ICD-10-CM | POA: Diagnosis not present

## 2021-02-10 DIAGNOSIS — I739 Peripheral vascular disease, unspecified: Secondary | ICD-10-CM

## 2021-02-10 DIAGNOSIS — B351 Tinea unguium: Secondary | ICD-10-CM

## 2021-02-10 DIAGNOSIS — M79674 Pain in right toe(s): Secondary | ICD-10-CM | POA: Diagnosis not present

## 2021-02-10 DIAGNOSIS — Z89512 Acquired absence of left leg below knee: Secondary | ICD-10-CM

## 2021-02-10 NOTE — Progress Notes (Signed)
Subjective: Peter Cochran is a 65 y.o. male patient seen today in office with complaint of mildly painful thickened and elongated toenails; unable to trim on the right foot. Patient reports that he is doing good saw cardiologist who changed some medications but otherwise doing good.  Patient Active Problem List   Diagnosis Date Noted   Wound of left leg    Toe ulcer (HCC)    Peripheral neuropathy    Periorbital hematoma, right    Leg ulcer, left (HCC)    Hypothyroidism (acquired)    Hand fracture, right    Enlarged prostate    Dry gangrene (HCC)    Difficulty voiding    Complication of anesthesia    Cellulitis    CHF (congestive heart failure) (HCC)    Scrotal edema 03/08/2018   Anemia 03/08/2018   Chronic hypotension 03/08/2018   Leukocytosis 03/08/2018   S/P AKA (above knee amputation) unilateral, left (HCC) 03/07/2018   Hypothyroidism 03/07/2018   BKA stump complication (HCC)    Hx of BKA, left (HCC) 02/28/2018   Antibiotic long-term use    AKI (acute kidney injury) (HCC)    Severe protein-calorie malnutrition (HCC) 12/12/2017   Bilateral lower leg cellulitis    Gangrene of toe of left foot (HCC)    Pressure injury of skin 12/07/2017   Sepsis due to cellulitis (HCC) 12/06/2017   Chronic systolic heart failure (HCC) 10/28/2017   Dilated cardiomyopathy (HCC) 10/27/2017   Mitral regurgitation 10/27/2017   Elevated troponin 10/27/2017   Elevated brain natriuretic peptide (BNP) level 10/27/2017   PAD (peripheral artery disease) (HCC) 10/27/2017   Chronic ulcer of left lower extremity with fat layer exposed (HCC) 10/27/2017   Fatigue 10/22/2017    Current Outpatient Medications on File Prior to Visit  Medication Sig Dispense Refill   aspirin EC 81 MG tablet Take 81 mg by mouth daily.     atorvastatin (LIPITOR) 40 MG tablet Take 1 tablet (40 mg total) by mouth daily. 90 tablet 1   carvedilol (COREG) 3.125 MG tablet TAKE ONE TABLET TWICE DAILY WITH MEALS 180 tablet 3    clopidogrel (PLAVIX) 75 MG tablet Take 1 tablet (75 mg total) by mouth daily with breakfast. 30 tablet 0   dapagliflozin propanediol (FARXIGA) 10 MG TABS tablet Take 1 tablet (10 mg total) by mouth daily before breakfast. 90 tablet 3   DULoxetine (CYMBALTA) 60 MG capsule Take 60 mg by mouth at bedtime.     furosemide (LASIX) 20 MG tablet Take 20 mg by mouth daily.     gabapentin (NEURONTIN) 300 MG capsule Take 300 mg by mouth 3 (three) times daily.     levothyroxine (SYNTHROID) 175 MCG tablet Take 175 mcg by mouth daily before breakfast.     Multiple Vitamin (MULTIVITAMIN WITH MINERALS) TABS tablet Take 1 tablet by mouth daily. 30 tablet 0   sacubitril-valsartan (ENTRESTO) 24-26 MG TAKE 1 TABLET BY MOUTH 3 TIMES A WEEK ON MONDAY, WEDNESDAY AND FRIDAYS 36 tablet 2   spironolactone (ALDACTONE) 25 MG tablet TAKE ONE (1) TABLET ONCE DAILY 90 tablet 1   No current facility-administered medications on file prior to visit.    Allergies  Allergen Reactions   Oxytetracycline Itching, Swelling and Other (See Comments)    Facial swelling   Sulfa Antibiotics Rash    Objective: Physical Exam  General: Well developed, nourished, no acute distress, awake, alert and oriented x 3.  Patient is assisted by wife's visit who will also be seen this visit.  Vascular: Dorsalis pedis artery 0/4 on right, Posterior tibial artery 0/4 on right skin temperature warm to cool proximal to distal on right, no varicosities, no pedal hair present on the right.  Neurological: Gross sensation present via light touch on right  Dermatological: Skin is cool, dry, and supple bilateral, Nails 1- 5 on right are tender, long, thick, and discolored with mild subungal debris, no webspace macerations present, no open lesions, dry skin noted on right leg with no open lesions or concern for acute infection.  Musculoskeletal: Status post left below the knee amputation.  Assessment and Plan:  Problem List Items Addressed This Visit        Cardiovascular and Mediastinum   PAD (peripheral artery disease) (HCC)   Other Visit Diagnoses     Pain due to onychomycosis of toenail of right foot    -  Primary   Venous insufficiency (chronic) (peripheral)       Neuropathy       History of left below knee amputation (HCC)          -Examined patient.  -Re-Discussed treatment options for painful mycotic nails. -Mechanically debrided and reduced mycotic nails with sterile nail nipper and dremel nail file without incident. -Advised patient daily skin emollients and close monitoring of legs like previous and continue with compression sock and daily skin emollients as directed -Patient to return in 3 months for follow up evaluation or sooner if symptoms worsen.  Asencion Islam, DPM

## 2021-02-16 ENCOUNTER — Other Ambulatory Visit: Payer: Self-pay | Admitting: Cardiology

## 2021-04-18 ENCOUNTER — Other Ambulatory Visit: Payer: Self-pay | Admitting: Cardiology

## 2021-05-16 ENCOUNTER — Ambulatory Visit: Payer: PRIVATE HEALTH INSURANCE | Admitting: Sports Medicine

## 2021-05-16 ENCOUNTER — Other Ambulatory Visit: Payer: Self-pay

## 2021-05-16 ENCOUNTER — Encounter: Payer: Self-pay | Admitting: Sports Medicine

## 2021-05-16 DIAGNOSIS — G629 Polyneuropathy, unspecified: Secondary | ICD-10-CM

## 2021-05-16 DIAGNOSIS — B351 Tinea unguium: Secondary | ICD-10-CM | POA: Diagnosis not present

## 2021-05-16 DIAGNOSIS — Z89512 Acquired absence of left leg below knee: Secondary | ICD-10-CM

## 2021-05-16 DIAGNOSIS — M79674 Pain in right toe(s): Secondary | ICD-10-CM

## 2021-05-16 DIAGNOSIS — I739 Peripheral vascular disease, unspecified: Secondary | ICD-10-CM

## 2021-05-16 DIAGNOSIS — I872 Venous insufficiency (chronic) (peripheral): Secondary | ICD-10-CM

## 2021-05-16 NOTE — Progress Notes (Signed)
Subjective: Peter Cochran is a 65 y.o. male patient seen today in office with complaint of mildly painful thickened and elongated toenails; unable to trim on the right foot. Patient reports that he is doing fine denies any pedal complaints.  Patient is still on Plavix like before.  No changes with medication or medical history since last encounter.  Patient Active Problem List   Diagnosis Date Noted   Wound of left leg    Toe ulcer (HCC)    Peripheral neuropathy    Periorbital hematoma, right    Leg ulcer, left (HCC)    Hypothyroidism (acquired)    Hand fracture, right    Enlarged prostate    Dry gangrene (HCC)    Difficulty voiding    Complication of anesthesia    Cellulitis    CHF (congestive heart failure) (HCC)    Scrotal edema 03/08/2018   Anemia 03/08/2018   Chronic hypotension 03/08/2018   Leukocytosis 03/08/2018   S/P AKA (above knee amputation) unilateral, left (HCC) 03/07/2018   Hypothyroidism 03/07/2018   BKA stump complication (HCC)    Hx of BKA, left (HCC) 02/28/2018   Antibiotic long-term use    AKI (acute kidney injury) (HCC)    Severe protein-calorie malnutrition (HCC) 12/12/2017   Bilateral lower leg cellulitis    Gangrene of toe of left foot (HCC)    Pressure injury of skin 12/07/2017   Sepsis due to cellulitis (HCC) 12/06/2017   Chronic systolic heart failure (HCC) 10/28/2017   Dilated cardiomyopathy (HCC) 10/27/2017   Mitral regurgitation 10/27/2017   Elevated troponin 10/27/2017   Elevated brain natriuretic peptide (BNP) level 10/27/2017   PAD (peripheral artery disease) (HCC) 10/27/2017   Chronic ulcer of left lower extremity with fat layer exposed (HCC) 10/27/2017   Fatigue 10/22/2017    Current Outpatient Medications on File Prior to Visit  Medication Sig Dispense Refill   aspirin EC 81 MG tablet Take 81 mg by mouth daily.     atorvastatin (LIPITOR) 40 MG tablet TAKE ONE (1) TABLET ONCE DAILY 90 tablet 1   carvedilol (COREG) 3.125 MG tablet TAKE  ONE TABLET TWICE DAILY WITH MEALS 180 tablet 3   clopidogrel (PLAVIX) 75 MG tablet Take 1 tablet (75 mg total) by mouth daily with breakfast. 30 tablet 0   dapagliflozin propanediol (FARXIGA) 10 MG TABS tablet Take 1 tablet (10 mg total) by mouth daily before breakfast. 90 tablet 3   DULoxetine (CYMBALTA) 60 MG capsule Take 60 mg by mouth at bedtime.     furosemide (LASIX) 20 MG tablet Take 1 tablet (20 mg total) by mouth daily. 90 tablet 2   gabapentin (NEURONTIN) 300 MG capsule Take 300 mg by mouth 3 (three) times daily.     levothyroxine (SYNTHROID) 175 MCG tablet Take 175 mcg by mouth daily before breakfast.     Multiple Vitamin (MULTIVITAMIN WITH MINERALS) TABS tablet Take 1 tablet by mouth daily. 30 tablet 0   sacubitril-valsartan (ENTRESTO) 24-26 MG TAKE 1 TABLET BY MOUTH 3 TIMES A WEEK ON MONDAY, WEDNESDAY AND FRIDAYS 36 tablet 2   spironolactone (ALDACTONE) 25 MG tablet TAKE ONE (1) TABLET ONCE DAILY 90 tablet 1   No current facility-administered medications on file prior to visit.    Allergies  Allergen Reactions   Oxytetracycline Itching, Swelling and Other (See Comments)    Facial swelling   Sulfa Antibiotics Rash    Objective: Physical Exam  General: Well developed, nourished, no acute distress, awake, alert and oriented x 3.  Patient is assisted by wife's visit who will also be seen this visit.  Vascular: Dorsalis pedis artery 0/4 on right, Posterior tibial artery 0/4 on right skin temperature warm to cool proximal to distal on right, no varicosities, no pedal hair present on the right.  Neurological: Gross sensation present via light touch on right  Dermatological: Skin is cool, dry, and supple bilateral, Nails 1- 5 on right are tender, long, thick, and discolored with mild subungal debris, no webspace macerations present, no open lesions, dry skin noted on right leg with no open lesions or concern for acute infection.  Musculoskeletal: Status post left below the knee  amputation.  Assessment and Plan:  Problem List Items Addressed This Visit       Cardiovascular and Mediastinum   PAD (peripheral artery disease) (HCC)   Other Visit Diagnoses     Pain due to onychomycosis of toenail of right foot    -  Primary   Venous insufficiency (chronic) (peripheral)       Neuropathy       History of left below knee amputation (HCC)          -Examined patient.  -Re-Discussed treatment options for painful mycotic nails. -Mechanically debrided and reduced mycotic nails with sterile nail nipper and length and girth and dremel nail file without incident. -Advised patient daily skin emollients and close monitoring of legs like previous applied foot miracle cream and advised patient to use the same again this visit - Continue with compression sock and daily skin emollients as directed -Patient to return in 3 months for follow up evaluation or sooner if symptoms worsen.  Asencion Islam, DPM

## 2021-05-30 ENCOUNTER — Other Ambulatory Visit: Payer: Self-pay | Admitting: Cardiology

## 2021-07-12 ENCOUNTER — Other Ambulatory Visit: Payer: Self-pay | Admitting: Cardiology

## 2021-07-26 ENCOUNTER — Ambulatory Visit: Payer: PRIVATE HEALTH INSURANCE | Admitting: Cardiology

## 2021-07-26 ENCOUNTER — Encounter: Payer: Self-pay | Admitting: Cardiology

## 2021-07-26 ENCOUNTER — Other Ambulatory Visit: Payer: Self-pay

## 2021-07-26 VITALS — BP 104/60 | HR 94 | Ht 71.0 in

## 2021-07-26 DIAGNOSIS — I9589 Other hypotension: Secondary | ICD-10-CM

## 2021-07-26 DIAGNOSIS — I5022 Chronic systolic (congestive) heart failure: Secondary | ICD-10-CM

## 2021-07-26 DIAGNOSIS — I739 Peripheral vascular disease, unspecified: Secondary | ICD-10-CM

## 2021-07-26 DIAGNOSIS — E782 Mixed hyperlipidemia: Secondary | ICD-10-CM | POA: Diagnosis not present

## 2021-07-26 NOTE — Progress Notes (Signed)
Cardiology Office Note:    Date:  07/26/2021   ID:  Peter Cochran, DOB [birth year], MRN [id number]  PCP:  [facility]  Cardiologist:  Norman Herrlich, MD    Referring MD: [facility]    ASSESSMENT:    1. Chronic systolic heart failure (HCC)   2. Chronic hypotension   3. Mixed hyperlipidemia   4. PAD (peripheral artery disease) (HCC)    PLAN:    In order of problems listed above:  Peter Cochran continues to do well with his heart failure compensated no fluid overload and is on a good medical regimen including maximally tolerated beta-blocker Entresto along with his MRA loop diuretic and SGLT2 inhibitor.  I hand wrote a note for him to have BMP proBNP checked with labs this week copy to my office.  I did ask him if he had syncope to let me know as he would be a particular high risk of ventricular fibrillation ventricular tachycardia.  I would hope at that time he would reconsider the issue of ICD Stable tolerating current medications he is not on an alpha agonist. Continue his statin peripheral arterial disease Stable PAD status post above-knee amputation   Next appointment: 6 months   Medication Adjustments/Labs and Tests Ordered: Current medicines are reviewed at length with the patient today.  Concerns regarding medicines are outlined above.  No orders of the defined types were placed in this encounter.  No orders of the defined types were placed in this encounter.   Chief Complaint  Patient presents with   Follow-up   Congestive Heart Failure    History of Present Illness:    Peter Cochran is a 66 y.o. male with a hx of chronic systolic heart failure with severely reduced ejection fraction peripheral arterial disease with a left above-the-knee amputation idiopathic hypotension hyperlipidemia last seen 12/13/2020.  His most recent ejection fraction March 2000 2130 to 35% with global hypokinesia.  He has declined consideration of an ICD and EP  consultation.  His medical therapy has been affected by hypotension and he tolerates a minimal dose of beta-blocker and Entresto with his diuretic and was initiated on SGLT2 inhibitor.  Compliance with diet, lifestyle and medications: Yes  His wife is present participates in evaluation decision making she is his primary caregiver He is frustrated that his strength is not improving but is not having edema shortness of breath chest pain palpitation or syncope. His wife follows blood pressure he is able to tolerate a minimum dose of beta-blocker and Entresto. Past Medical History:  Diagnosis Date   AKI (acute kidney injury) (HCC)    Anemia 03/08/2018   Antibiotic long-term use    Bilateral lower leg cellulitis    BKA stump complication (HCC)    Cellulitis    mild.  L leg   CHF (congestive heart failure) (HCC)    Dr. 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   Elevated brain natriuretic peptide (BNP) level 10/27/2017   Elevated troponin 10/27/2017   Enlarged prostate    Fatigue 10/22/2017   Gangrene of toe of left foot (HCC)    Hand fracture, right  Dr. 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 Dr. Liston AlbaPedro Hernandez, Dr. Bynum Bellowsitorya Strover.    Past Surgical History:  Procedure Laterality Date   AMPUTATION Left 02/28/2018   Procedure: LEFT BELOW KNEE AMPUTATION;  Surgeon: Nadara Mustarduda, Marcus V, MD;  [facility];  Service: Orthopedics;  Laterality: Left;   AORTOGRAM Left 12/12/2017   Procedure: AORTOGRAM LEFT LOWER EXTREMITY RUNOFF;  Surgeon: Fransisco Hertzhen, Virgel Haro L, MD;  [facility];  Service: Vascular;  Laterality: Left;   BELOW KNEE LEG AMPUTATION Left 02/28/2018   CYST EXCISION     PERIPHERAL VASCULAR BALLOON ANGIOPLASTY Left 12/12/2017   Procedure: PERIPHERAL VASCULAR BALLOON ANGIOPLASTY LEFT SFA;  Surgeon: Fransisco Hertzhen, Mayrene Bastarache L, MD;  [facility];  Service: Vascular;  Laterality: Left;   PILONIDAL CYST DRAINAGE     STUMP REVISION Left 03/07/2018   Procedure: REVISION LEFT BELOW KNEE AMPUTATION VS. LEFT ABOVE KNEE AMPUTATION;  Surgeon: Nadara Mustarduda, Marcus V, MD;  Location: [facility] OR;  Service: Orthopedics;  Laterality: Left;    Current Medications: Current Meds  Medication Sig   aspirin EC 81 MG tablet Take 81 mg by mouth daily.   atorvastatin (LIPITOR) 40 MG tablet TAKE ONE (1) TABLET ONCE DAILY   carvedilol (COREG) 3.125 MG tablet TAKE ONE TABLET TWICE DAILY WITH MEALS   clopidogrel (PLAVIX) 75 MG tablet Take 1 tablet (75 mg total) by mouth daily with breakfast.   dapagliflozin propanediol (FARXIGA) 10 MG TABS tablet Take 1 tablet (10 mg total) by mouth daily before breakfast.   DULoxetine (CYMBALTA) 60 MG capsule Take 60 mg by mouth at bedtime.   furosemide (LASIX) 20 MG tablet Take 1 tablet (20 mg total) by mouth daily.   gabapentin (NEURONTIN) 400 MG capsule Take 400 mg by mouth 3 (three) times daily.   Multiple Vitamin (MULTIVITAMIN WITH MINERALS) TABS tablet Take 1 tablet by mouth daily.   sacubitril-valsartan (ENTRESTO) 24-26 MG TAKE 1 TABLET BY MOUTH 3 TIMES A WEEK ON MONDAY, WEDNESDAY, AND FRIDAY   spironolactone (ALDACTONE) 25 MG tablet TAKE ONE (1) TABLET ONCE DAILY    [DISCONTINUED] levothyroxine (SYNTHROID) 175 MCG tablet Take 175 mcg by mouth daily before breakfast.     Allergies:   Oxytetracycline and Sulfa antibiotics   Social History   Socioeconomic History   Marital status: Married    Spouse name: Not on file   Number of children: Not on file   Years of education: Not on file   Highest education level: Not on file  Occupational History   Not on file  Tobacco Use   Smoking status: Every Day    Packs/day: 1.50    Years: 50.00    Pack years: 75.00    Types: Cigarettes    Passive exposure: Current   Smokeless tobacco: Never   Tobacco comments:    1-1.5 pks per day  Vaping Use   Vaping Use: Never used  Substance and Sexual Activity   Alcohol use: Not Currently   Drug use: Not Currently   Sexual activity: Not on file  Other Topics Concern   Not on file  Social History Narrative   Not on file   Social Determinants of Health   Financial Resource Strain: Not on file  Food Insecurity: Not on file  Transportation Needs: Not on file  Physical Activity: Not on file  Stress: Not on file  Social Connections: Not on file     Family History: The patient's family history includes Diabetes in his father; Heart attack in his father; Hyperlipidemia in his father and mother; Hypertension in his father and mother; Stroke in his maternal grandmother. ROS:   Please see the history of present illness.    All other systems reviewed and are negative.  EKGs/Labs/Other Studies Reviewed:    The following studies were reviewed today:    Recent Labs: He is pending lab work this week at the city of [facility] health facility  Physical Exam:    VS:  BP 104/60 (BP Location: Left Arm)    Pulse 94    Ht 5\' 11"  (1.803 m)    SpO2 99%    BMI 28.87 kg/m     Wt Readings from Last 3 Encounters:  11/17/18 207 lb (93.9 kg)  09/26/18 207 lb (93.9 kg)  09/22/18 207 lb (93.9 kg)     GEN:  Well nourished, well developed in no acute distress HEENT:  Normal NECK: No JVD; No carotid bruits LYMPHATICS: No lymphadenopathy CARDIAC: RRR, no murmurs, rubs, gallops RESPIRATORY:  Clear to auscultation without rales, wheezing or rhonchi  ABDOMEN: Soft, non-tender, non-distended MUSCULOSKELETAL:  No edema; No deformity  SKIN: Warm and dry NEUROLOGIC:  Alert and oriented x 3 PSYCHIATRIC:  Normal affect    Signed, 11/22/18, MD  07/26/2021 12:01 PM    [facility]

## 2021-07-26 NOTE — Patient Instructions (Signed)
Medication Instructions:  Your physician recommends that you continue on your current medications as directed. Please refer to the Current Medication list given to you today.  *If you need a refill on your cardiac medications before your next appointment, please call your pharmacy*   Lab Work: NONE If you have labs (blood work) drawn today and your tests are completely normal, you will receive your results only by: MyChart Message (if you have MyChart) OR A paper copy in the mail If you have any lab test that is abnormal or we need to change your treatment, we will call you to review the results.   Testing/Procedures: NONE   Follow-Up: At [facility], you and your health needs are our priority.  As part of our continuing mission to provide you with exceptional heart care, we have created designated Provider Care Teams.  These Care Teams include your primary Cardiologist (physician) and Advanced Practice Providers (APPs -  Physician Assistants and Nurse Practitioners) who all work together to provide you with the care you need, when you need it.  We recommend signing up for the patient portal called "MyChart".  Sign up information is provided on this After Visit Summary.  MyChart is used to connect with patients for Virtual Visits (Telemedicine).  Patients are able to view lab/test results, encounter notes, upcoming appointments, etc.  Non-urgent messages can be sent to your provider as well.   To learn more about what you can do with MyChart, go to [email or web address].    Your next appointment:   4 month(s)  The format for your next appointment:   In Person  Provider:   Norman Herrlich, MD    Other Instructions HAVE BMP AND PRO BNP

## 2021-08-16 ENCOUNTER — Ambulatory Visit: Payer: PRIVATE HEALTH INSURANCE | Admitting: Sports Medicine

## 2021-11-14 ENCOUNTER — Ambulatory Visit: Payer: PRIVATE HEALTH INSURANCE | Admitting: Sports Medicine

## 2021-11-14 ENCOUNTER — Encounter: Payer: Self-pay | Admitting: Sports Medicine

## 2021-11-14 DIAGNOSIS — I739 Peripheral vascular disease, unspecified: Secondary | ICD-10-CM

## 2021-11-14 DIAGNOSIS — B351 Tinea unguium: Secondary | ICD-10-CM

## 2021-11-14 DIAGNOSIS — G629 Polyneuropathy, unspecified: Secondary | ICD-10-CM

## 2021-11-14 DIAGNOSIS — Z89512 Acquired absence of left leg below knee: Secondary | ICD-10-CM

## 2021-11-14 DIAGNOSIS — I872 Venous insufficiency (chronic) (peripheral): Secondary | ICD-10-CM

## 2021-11-14 DIAGNOSIS — M79674 Pain in right toe(s): Secondary | ICD-10-CM | POA: Diagnosis not present

## 2021-11-14 NOTE — Progress Notes (Signed)
Subjective: ?Peter Cochran is a 66 y.o. male patient seen today in office with complaint of mildly painful thickened and elongated toenails; unable to trim on the right foot. Patient reports that he is doing fine but did notice that there is some rubbing at the right second toe not sure how it happened thinks he might of kicked something when he was sleeping.  Patient is still on Plavix like before.  No changes with medication or medical history since last encounter. ? ?Patient Active Problem List  ? Diagnosis Date Noted  ? Wound of left leg   ? Toe ulcer (HCC)   ? Peripheral neuropathy   ? Periorbital hematoma, right   ? Leg ulcer, left (HCC)   ? Hypothyroidism (acquired)   ? Hand fracture, right   ? Enlarged prostate   ? Dry gangrene (HCC)   ? Difficulty voiding   ? Complication of anesthesia   ? Cellulitis   ? CHF (congestive heart failure) (HCC)   ? Scrotal edema 03/08/2018  ? Anemia 03/08/2018  ? Chronic hypotension 03/08/2018  ? Leukocytosis 03/08/2018  ? S/P AKA (above knee amputation) unilateral, left (HCC) 03/07/2018  ? Hypothyroidism 03/07/2018  ? BKA stump complication (HCC)   ? Hx of BKA, left (HCC) 02/28/2018  ? Antibiotic long-term use   ? AKI (acute kidney injury) (HCC)   ? Severe protein-calorie malnutrition (HCC) 12/12/2017  ? Bilateral lower leg cellulitis   ? Gangrene of toe of left foot (HCC)   ? Pressure injury of skin 12/07/2017  ? Sepsis due to cellulitis (HCC) 12/06/2017  ? Chronic systolic heart failure (HCC) 10/28/2017  ? Dilated cardiomyopathy (HCC) 10/27/2017  ? Mitral regurgitation 10/27/2017  ? Elevated troponin 10/27/2017  ? Elevated brain natriuretic peptide (BNP) level 10/27/2017  ? PAD (peripheral artery disease) (HCC) 10/27/2017  ? Chronic ulcer of left lower extremity with fat layer exposed (HCC) 10/27/2017  ? Fatigue 10/22/2017  ? ? ?Current Outpatient Medications on File Prior to Visit  ?Medication Sig Dispense Refill  ? ADVAIR DISKUS 250-50 MCG/ACT AEPB 1 puff 2 (two) times  daily.    ? albuterol (VENTOLIN HFA) 108 (90 Base) MCG/ACT inhaler SMARTSIG:2 Puff(s) By Mouth Every 4 Hours PRN    ? aspirin EC 81 MG tablet Take 81 mg by mouth daily.    ? atorvastatin (LIPITOR) 40 MG tablet TAKE ONE (1) TABLET ONCE DAILY 90 tablet 1  ? carvedilol (COREG) 3.125 MG tablet TAKE ONE TABLET TWICE DAILY WITH MEALS 180 tablet 3  ? clopidogrel (PLAVIX) 75 MG tablet Take 1 tablet (75 mg total) by mouth daily with breakfast. 30 tablet 0  ? dapagliflozin propanediol (FARXIGA) 10 MG TABS tablet Take 1 tablet (10 mg total) by mouth daily before breakfast. 90 tablet 3  ? DULoxetine (CYMBALTA) 60 MG capsule Take 60 mg by mouth at bedtime.    ? furosemide (LASIX) 20 MG tablet Take 1 tablet (20 mg total) by mouth daily. 90 tablet 2  ? gabapentin (NEURONTIN) 400 MG capsule Take 400 mg by mouth 3 (three) times daily.    ? levothyroxine (SYNTHROID) 150 MCG tablet Take 150 mcg by mouth every morning.    ? Multiple Vitamin (MULTIVITAMIN WITH MINERALS) TABS tablet Take 1 tablet by mouth daily. 30 tablet 0  ? nystatin cream (MYCOSTATIN) Apply topically 2 (two) times daily.    ? sacubitril-valsartan (ENTRESTO) 24-26 MG TAKE 1 TABLET BY MOUTH 3 TIMES A WEEK ON MONDAY, WEDNESDAY, AND FRIDAY 36 tablet 2  ? spironolactone (  ALDACTONE) 25 MG tablet TAKE ONE (1) TABLET ONCE DAILY 90 tablet 1  ? ?No current facility-administered medications on file prior to visit.  ? ? ?Allergies  ?Allergen Reactions  ? Oxytetracycline Itching, Swelling and Other (See Comments)  ?  Facial swelling  ? Sulfa Antibiotics Rash  ? ? ?Objective: ?Physical Exam ? ?General: Well developed, nourished, no acute distress, awake, alert and oriented x 3. ? ?Vascular: Dorsalis pedis artery 0/4 on right, Posterior tibial artery 0/4 on right skin temperature warm to cool proximal to distal on right, no varicosities, no pedal hair present on the right. ? ?Neurological: Gross sensation present via light touch on right ? ?Dermatological: Skin is cool, dry, and  supple bilateral, Nails 1- 5 on right are tender, long, thick, and discolored with mild subungal debris, no webspace macerations present, ruptured blister at the right second toe that is now dry in nature, dry skin noted on right leg with no open lesions or concern for acute infection. ? ?Musculoskeletal: Status post left below the knee amputation. ? ?Assessment and Plan:  ?Problem List Items Addressed This Visit   ? ?  ? Cardiovascular and Mediastinum  ? PAD (peripheral artery disease) (HCC)  ? ?Other Visit Diagnoses   ? ? Pain due to onychomycosis of toenail of right foot    -  Primary  ? Relevant Medications  ? nystatin cream (MYCOSTATIN)  ? Venous insufficiency (chronic) (peripheral)      ? Neuropathy      ? History of left below knee amputation (HCC)      ? ?  ? ?-Examined patient.  ?-Re-Discussed treatment options for painful mycotic nails. ?-Mechanically debrided and reduced all painful mycotic nails 1 through 5 on the right with sterile nail nipper and dremel nail file without incident. ?-Applied Betadine to the right second toe and advised patient to do the same for 1 week for preventative measures due to history of ruptured blister that has now healed and to avoid a shoe that could rub the toe and avoid bumping or reinjuring the toe or wearing compression sleeve that is too tight that could rub the toe for the next week ?-Advised patient daily skin emollients and close monitoring of legs like previous; advised Vaseline as directed ?-Patient to return in 2-1/2 to 3 months for follow up evaluation or sooner if symptoms worsen. ? ?Asencion Islam, DPM ? ?

## 2021-11-28 ENCOUNTER — Ambulatory Visit: Payer: PRIVATE HEALTH INSURANCE | Admitting: Cardiology

## 2022-01-03 ENCOUNTER — Other Ambulatory Visit: Payer: Self-pay | Admitting: Cardiology

## 2022-02-12 ENCOUNTER — Ambulatory Visit (INDEPENDENT_AMBULATORY_CARE_PROVIDER_SITE_OTHER): Payer: PRIVATE HEALTH INSURANCE | Admitting: Podiatry

## 2022-02-12 DIAGNOSIS — Z91199 Patient's noncompliance with other medical treatment and regimen due to unspecified reason: Secondary | ICD-10-CM

## 2022-02-12 NOTE — Progress Notes (Signed)
No show

## 2022-07-10 ENCOUNTER — Other Ambulatory Visit: Payer: Self-pay | Admitting: Cardiology

## 2022-08-07 ENCOUNTER — Other Ambulatory Visit: Payer: Self-pay | Admitting: Cardiology

## 2022-08-21 DIAGNOSIS — F321 Major depressive disorder, single episode, moderate: Secondary | ICD-10-CM | POA: Insufficient documentation

## 2022-08-21 DIAGNOSIS — R5381 Other malaise: Secondary | ICD-10-CM | POA: Insufficient documentation

## 2022-08-21 DIAGNOSIS — I10 Essential (primary) hypertension: Secondary | ICD-10-CM | POA: Insufficient documentation

## 2022-08-21 DIAGNOSIS — K4020 Bilateral inguinal hernia, without obstruction or gangrene, not specified as recurrent: Secondary | ICD-10-CM | POA: Insufficient documentation

## 2022-08-21 DIAGNOSIS — E782 Mixed hyperlipidemia: Secondary | ICD-10-CM | POA: Insufficient documentation

## 2022-08-21 DIAGNOSIS — R159 Full incontinence of feces: Secondary | ICD-10-CM | POA: Insufficient documentation

## 2022-08-23 NOTE — Progress Notes (Signed)
Cardiology Office Note:    Date:  08/24/2022   ID:  SAINTCLAIR MCEVOY, DOB 04/26/1956, MRN [id number]  PCP:  Pc, [facility]  Cardiologist:  Shirlee More, MD    Referring MD: Pc, [facility]*    ASSESSMENT:    1. Chronic systolic heart failure ([place])   2. Chronic hypotension   3. Mixed hyperlipidemia   4. PAD (peripheral artery disease) (HCC)    PLAN:    In order of problems listed above:  Is doing well with guideline directed therapy for heart failure including his SGLT2 inhibitor beta-blocker loop diuretic MRA and Entresto we will uptitrate his Entresto this visit.  He is not fluid overloaded continue his current loop diuretic.  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  Concerns regarding medicines are outlined above.  No orders of the defined types were placed in this encounter.  No orders of the defined types were placed in this encounter.   Chief Complaint  Patient presents with   Follow-up   Congestive Heart Failure    History of Present Illness:    Peter MARINE is a 67 y.o. male with a hx of chronic systolic heart failure with severely reduced ejection fraction peripheral arterial disease with left above-the-knee amputation idiopathic hypotension and hyperlipidemia last seen 07/26/2021.  His most recent ejection fraction 09/29/2021 showed severe LV dysfunction EF 30 to 35%.  Compliance with diet, lifestyle and medications: Yes  Recently transition from city insurance to [facility] and has a new primary care physician From a cardiology perspective I think he is doing well he has had no heart failure deterioration although they have stopped checking  home blood pressure.  He is on good medical therapy including his beta-blocker loop diuretic MRA and Entresto with his blood pressure greater than 120 will uptitrate his Entresto to mid range He is not having edema orthopnea chest pain palpitation or syncope Past Medical History:  Diagnosis Date   AKI (acute kidney injury) ([place])    Anemia 03/08/2018   Antibiotic long-term use    Bilateral lower leg cellulitis    BKA stump complication (HCC)    Cellulitis    mild.  L leg   CHF (congestive heart failure) ([place])    Dr. 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   Elevated brain natriuretic peptide (BNP) level 10/27/2017   Elevated troponin 10/27/2017   Enlarged prostate    Fatigue 10/22/2017   Gangrene of toe of left foot ([place])    Hand fracture, right    Dr. Joya Salm   Hx of BKA, left ([place]) 02/28/2018   Hypothyroidism 03/07/2018   Hypothyroidism (acquired)    Leg ulcer, left ([place])    Leukocytosis 03/08/2018   Mitral regurgitation 10/27/2017   PAD (peripheral artery disease) (HCC)    Periorbital hematoma, right    Peripheral neuropathy    Pressure injury of skin  12/07/2017   S/P AKA (above knee amputation) unilateral, left ([place]) 03/07/2018   Scrotal edema 03/08/2018   Sepsis due to cellulitis ([place]) 12/06/2017   Severe protein-calorie malnutrition ([place]) 12/12/2017   Toe ulcer ([place])    Left   Wound of left leg    Non-healing. Dr. Meda Coffee, Dr. Cherlyn Labella.    Past Surgical History:  Procedure Laterality Date   AMPUTATION Left 02/28/2018   Procedure: LEFT BELOW KNEE AMPUTATION;  Surgeon: Newt Minion, MD;  Location: [place];  Service: Orthopedics;  Laterality: Left;   AORTOGRAM Left  12/12/2017   Procedure: AORTOGRAM LEFT LOWER EXTREMITY RUNOFF;  Surgeon: Conrad Negaunee, MD;  Location: [place];  Service: Vascular;  Laterality: Left;   BELOW KNEE LEG AMPUTATION Left 02/28/2018   CYST EXCISION     PERIPHERAL VASCULAR BALLOON ANGIOPLASTY Left 12/12/2017   Procedure: PERIPHERAL VASCULAR BALLOON ANGIOPLASTY LEFT SFA;  Surgeon: Conrad Nelsonville, MD;  Location: [place];  Service: Vascular;  Laterality: Left;   PILONIDAL CYST DRAINAGE     STUMP REVISION Left 03/07/2018   Procedure: REVISION LEFT BELOW KNEE AMPUTATION VS. LEFT ABOVE KNEE AMPUTATION;  Surgeon: Newt Minion, MD;  Location: [place];  Service: Orthopedics;  Laterality: Left;    Current Medications: Current Meds  Medication Sig   ADVAIR DISKUS 500-50 MCG/ACT AEPB Inhale 1 puff into the lungs in the morning and at bedtime.   albuterol (VENTOLIN HFA) 108 (90 Base) MCG/ACT inhaler SMARTSIG:2 Puff(s) By Mouth Every 4 Hours PRN   aspirin EC 81 MG tablet Take 81 mg by mouth daily.   atorvastatin (LIPITOR) 40 MG tablet TAKE ONE (1) TABLET ONCE DAILY   carvedilol (COREG) 3.125 MG tablet TAKE ONE TABLET TWICE DAILY WITH MEALS   clopidogrel (PLAVIX) 75 MG tablet Take 1 tablet (75 mg total) by mouth daily with breakfast.   dapagliflozin propanediol (FARXIGA) 10 MG TABS tablet Take 1 tablet (10 mg total) by mouth daily before breakfast.   DULoxetine (CYMBALTA) 60 MG capsule Take 60 mg by mouth at bedtime.   furosemide (LASIX) 20 MG tablet Take 1 tablet (20 mg total) by mouth daily.   gabapentin (NEURONTIN) 400 MG capsule Take 400 mg by mouth 3 (three) times daily.   levothyroxine (SYNTHROID) 150 MCG tablet Take 150 mcg by mouth every morning.   Multiple Vitamin (MULTIVITAMIN WITH MINERALS) TABS tablet Take 1 tablet by mouth daily.   spironolactone (ALDACTONE) 25 MG tablet TAKE 1 TABLET BY MOUTH EVERY DAY.   [DISCONTINUED] sacubitril-valsartan (ENTRESTO) 24-26 MG Take 1 tablet by mouth every Monday, Wednesday, and Friday.     Allergies:    Oxytetracycline and Sulfa antibiotics   Social History   Socioeconomic History   Marital status: Married    Spouse name: Not on file   Number of children: Not on file   Years of education: Not on file   Highest education level: Not on file  Occupational History   Not on file  Tobacco Use   Smoking status: Every Day    Packs/day: 1.50    Years: 50.00    Total pack years: 75.00    Types: Cigarettes    Passive exposure: Current   Smokeless tobacco: Never   Tobacco comments:    1-1.5 pks per day  Vaping Use   Vaping Use: Never used  Substance and Sexual Activity   Alcohol use: Not Currently   Drug use: Not Currently   Sexual activity: Not on file  Other Topics Concern  Not on file  Social History Narrative   Not on file   Social Determinants of Health   Financial Resource Strain: Not on file  Food Insecurity: Not on file  Transportation Needs: Not on file  Physical Activity: Not on file  Stress: Not on file  Social Connections: Not on file     Family History: The patient's family history includes Diabetes in his father; Heart attack in his father; Hyperlipidemia in his father and mother; Hypertension in his father and mother; Stroke in his maternal grandmother. ROS:   Please see the history of present illness.    All other systems reviewed and are negative.  EKGs/Labs/Other Studies Reviewed:    The following studies were reviewed today:  EKG:  EKG ordered today and personally reviewed.  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  Recent Labs: 08/21/2022 potassium 4.2 sodium 136 creatinine 0.84 GFR greater than and 60 cc normal liver function test Cholesterol 128 LDL 61 triglycerides 184 HDL 44.  His TSH was severely elevated 179.  Physical Exam:    VS:  BP 124/76 (BP Location: Left Arm, Patient Position: Sitting)   Pulse 97   Ht 5' 11"$  (1.803 m)   SpO2 91%   BMI 28.87 kg/m     Wt Readings from Last  3 Encounters:  11/17/18 207 lb (93.9 kg)  09/26/18 207 lb (93.9 kg)  09/22/18 207 lb (93.9 kg)     GEN:  Well nourished, well developed in no acute distress thyroid appearance HEENT: Normal NECK: No JVD; No carotid bruits LYMPHATICS: No lymphadenopathy CARDIAC: RRR, no murmurs, rubs, gallops RESPIRATORY:  Clear to auscultation without rales, wheezing or rhonchi  ABDOMEN: Soft, non-tender, non-distended MUSCULOSKELETAL:  No edema; No deformity  SKIN: Warm and dry NEUROLOGIC:  Alert and oriented x 3 PSYCHIATRIC:  Normal affect   Seen with Edwyna Shell, RN chaperone   Signed, Shirlee More, MD  08/24/2022 1:33 PM    [facility]

## 2022-08-24 ENCOUNTER — Encounter: Payer: Self-pay | Admitting: Cardiology

## 2022-08-24 ENCOUNTER — Ambulatory Visit: Payer: Medicare Other | Attending: Cardiology | Admitting: Cardiology

## 2022-08-24 VITALS — BP 124/76 | HR 97 | Ht 71.0 in

## 2022-08-24 DIAGNOSIS — I739 Peripheral vascular disease, unspecified: Secondary | ICD-10-CM | POA: Diagnosis not present

## 2022-08-24 DIAGNOSIS — E782 Mixed hyperlipidemia: Secondary | ICD-10-CM

## 2022-08-24 DIAGNOSIS — I5022 Chronic systolic (congestive) heart failure: Secondary | ICD-10-CM | POA: Diagnosis not present

## 2022-08-24 MED ORDER — ENTRESTO 49-51 MG PO TABS: 1.0000 | ORAL_TABLET | Freq: Two times a day (BID) | ORAL | 3 refills | Status: DC

## 2022-08-24 NOTE — Patient Instructions (Signed)
Medication Instructions:  Your physician has recommended you make the following change in your medication:   START: Entresto 49-51 twice daily  *If you need a refill on your cardiac medications before your next appointment, please call your pharmacy*   Lab Work: None If you have labs (blood work) drawn today and your tests are completely normal, you will receive your results only by: [place] (if you have MyChart) OR A paper copy in the mail If you have any lab test that is abnormal or we need to change your treatment, we will call you to review the results.   Testing/Procedures: None    Follow-Up: At [facility], you and your health needs are our priority.  As part of our continuing mission to provide you with exceptional heart care, we have created designated Provider Care Teams.  These Care Teams include your primary Cardiologist (physician) and Advanced Practice Providers (APPs -  Physician Assistants and Nurse Practitioners) who all work together to provide you with the care you need, when you need it.  We recommend signing up for the patient portal called "MyChart".  Sign up information is provided on this After Visit Summary.  MyChart is used to connect with patients for Virtual Visits (Telemedicine).  Patients are able to view lab/test results, encounter notes, upcoming appointments, etc.  Non-urgent messages can be sent to your provider as well.   To learn more about what you can do with MyChart, go to [email or web address].    Your next appointment:   6 month(s)  Provider:   Shirlee More, MD    Other Instructions Check and record home blood pressure daily if <100 contact us.  This visit was accompanied by Edwyna Shell.

## 2022-08-27 ENCOUNTER — Telehealth: Payer: Self-pay | Admitting: Cardiology

## 2022-08-27 MED ORDER — ENTRESTO 24-26 MG PO TABS: 1.0000 | ORAL_TABLET | Freq: Every day | ORAL | 12 refills | Status: DC

## 2022-08-27 NOTE — Telephone Encounter (Signed)
Spoke with Macie Burows per DPR who states that pt was sent last week and his Delene Loll was increased to 49-51 mg twice daily. Pt was previously taking Entresto 24-26 mg once daily on Monday, Wednesday and Friday. She is concerned that this is a drastic change in his dose. Please advise.

## 2022-08-27 NOTE — Telephone Encounter (Signed)
Pt c/o medication issue:  1. Name of Medication:  Entresto    2. How are you currently taking this medication (dosage and times per day)?   3. Are you having a reaction (difficulty breathing--STAT)?   4. What is your medication issue?   Patient's wife is requesting to speak with Dr. Joya Gaskins nurse regarding recent medication change.

## 2022-08-27 NOTE — Addendum Note (Signed)
Addended by: Truddie Hidden on: 08/27/2022 09:58 AM   Modules accepted: Orders

## 2022-08-27 NOTE — Telephone Encounter (Signed)
Peter Cochran of Dr. Joya Gaskins recommendation to take the Entresto 24-26 mg daily. Medication list has been updated to reflect the changes. Peter Cochran verbalized understanding and had no additional questions.

## 2023-02-19 DIAGNOSIS — R7303 Prediabetes: Secondary | ICD-10-CM | POA: Insufficient documentation

## 2023-02-19 DIAGNOSIS — J418 Mixed simple and mucopurulent chronic bronchitis: Secondary | ICD-10-CM | POA: Insufficient documentation

## 2023-02-19 DIAGNOSIS — L89311 Pressure ulcer of right buttock, stage 1: Secondary | ICD-10-CM | POA: Insufficient documentation

## 2023-04-23 ENCOUNTER — Encounter: Payer: Self-pay | Admitting: *Deleted

## 2023-04-23 NOTE — Progress Notes (Unsigned)
Cardiology Office Note:    Date:  04/24/2023   ID:  Peter Cochran, DOB October 08, 1955, MRN [id number]  PCP:  [facility]  Cardiologist:  Norman Herrlich, MD    Referring MD: Pc, [facility]*    ASSESSMENT:    1. Chronic systolic heart failure (HCC)   2. Chronic hypotension   3. Mixed hyperlipidemia   4. PAD (peripheral artery disease) (HCC)    PLAN:    In order of problems listed above:  He is doing well with current medical therapy he is on maximally tolerated dose of his beta-blocker with blood pressure that runs 90-100 at home along with his SGLT2 inhibitor low-dose Entresto spironolactone and furosemide he has no fluid overload Stable we managed to balance his heart failure against hypotension and no symptomatic orthostatic hypotension Continue his statin with PND   Next appointment: 6 months we will do an echo 1 week before that visit   Medication Adjustments/Labs and Tests Ordered: Current medicines are reviewed at length with the patient today.  Concerns regarding medicines are outlined above.  No orders of the defined types were placed in this encounter.  No orders of the defined types were placed in this encounter.    History of Present Illness:    Peter Cochran is a 67 y.o. male with a hx of chronic systolic heart failure with severely reduced ejection fraction most recently 30 to 35% in 2023 peripheral arterial disease with left above-the-knee amputation idiopathic hypotension and hyperlipidemia last seen 08/24/2022.  Compliance with diet, lifestyle and medications: Yes  Overall he is doing well he finds himself fatigued his wife notices at times occasional irregularity at the blood pressure cuff he is asymptomatic he is not having palpitation edema shortness of breath chest pain or syncope I offered an event monitor he declined I urged the purchased a mobile Kardia device to capture episodes symptoms to make Labs are followed at his  PCP office Past Medical History:  Diagnosis Date   AKI (acute kidney injury) (HCC)    Anemia 03/08/2018   Antibiotic long-term use    Bilateral lower leg cellulitis    BKA stump complication (HCC)    Cellulitis    mild.  L leg   CHF (congestive heart failure) (HCC)    Dr. 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   Elevated brain natriuretic peptide (BNP) level 10/27/2017   Elevated troponin 10/27/2017   Enlarged prostate    Fatigue 10/22/2017   Gangrene of toe of left foot (HCC)    Hand fracture, right    Dr. 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 Dr. Liston Alba, Dr. Bynum Bellows.    Current Medications: Current Meds  Medication Sig   albuterol (VENTOLIN HFA) 108 (90 Base) MCG/ACT inhaler Inhale 2 puffs into the lungs every 6 (six) hours as needed for wheezing or shortness of breath.   aspirin EC 81 MG tablet Take 81 mg by mouth daily.   atorvastatin (LIPITOR) 40 MG tablet TAKE ONE (1) TABLET ONCE DAILY (Patient taking differently: Take 40 mg by mouth daily.)   carvedilol (COREG) 3.125 MG tablet TAKE ONE TABLET TWICE DAILY WITH MEALS (Patient taking  differently: Take 3.125 mg by mouth 2 (two) times daily with a meal.)   clopidogrel (PLAVIX) 75 MG tablet Take 1 tablet (75 mg total) by mouth daily with breakfast.   dapagliflozin propanediol (FARXIGA) 10 MG TABS tablet Take 1 tablet (10 mg total) by mouth daily before breakfast.   DULoxetine (CYMBALTA) 60 MG capsule Take 60 mg by mouth at bedtime.   fluticasone-salmeterol (WIXELA INHUB) 500-50 MCG/ACT AEPB Inhale 1 puff into the lungs in the morning and at bedtime.   furosemide (LASIX) 20 MG tablet Take 1 tablet (20 mg total) by mouth daily.   gabapentin (NEURONTIN) 400 MG capsule Take 400 mg by mouth 3 (three) times daily.   levothyroxine (SYNTHROID) 150 MCG tablet Take 150 mcg by mouth every morning.   Multiple Vitamin (MULTIVITAMIN WITH MINERALS) TABS tablet Take 1 tablet by mouth daily.   sacubitril-valsartan (ENTRESTO) 24-26 MG Take 1 tablet by mouth daily in the afternoon.   spironolactone (ALDACTONE) 25 MG tablet TAKE 1 TABLET BY MOUTH EVERY DAY. (Patient taking differently: Take 25 mg by mouth daily.)   [DISCONTINUED] ADVAIR DISKUS 500-50 MCG/ACT AEPB Inhale 1 puff into the lungs in the morning and at bedtime.      EKGs/Labs/Other Studies Reviewed:    The following studies were reviewed today:  Cardiac Studies & Procedures     STRESS TESTS  MYOCARDIAL PERFUSION IMAGING 11/23/2015   ECHOCARDIOGRAM  ECHOCARDIOGRAM COMPLETE 09/30/2019  Narrative ECHOCARDIOGRAM REPORT    Patient Name:   Peter Cochran Date of Exam: 09/30/2019 Medical Rec #:  [id number]      Height:       71.0 in Accession #:    [id number]     Weight:       207.0 lb Date of Birth:  03-Jun-1956     BSA:          2.140 m Patient Age:    63 years       BP:           88/68 mmHg Patient Gender: M              HR:           83 bpm. Exam Location:  [place]  Procedure: 2D Echo and Intracardiac Opacification Agent  Indications:    Dilated cardiomyopathy (HCC) [I42.0]  History:        Patient has prior history  of Echocardiogram examinations, most recent 09/09/2018. CHF, PAD and Hx of BKA, left, Mitral regurgitation; Signs/Symptoms:Hypotension.  Sonographer:    Louie Boston Referring Phys: [id number] Jozlynn Plaia J [facility]   Sonographer Comments: Suboptimal parasternal window and suboptimal apical window. IMPRESSIONS   1. Left ventricular ejection fraction, by estimation, is 30 to 35%. The left ventricle has severely decreased function. The left ventricle demonstrates global hypokinesis. Left ventricular diastolic parameters are consistent with Grade I diastolic dysfunction (impaired relaxation). 2. Right  ventricular systolic function is mildly reduced. The right ventricular size is normal. 3. The mitral valve was not well visualized. No evidence of mitral valve regurgitation. No evidence of mitral stenosis. 4. The aortic valve was not well visualized. Aortic valve regurgitation is not visualized. No aortic stenosis is present.  FINDINGS Left Ventricle: Left ventricular ejection fraction, by estimation, is 30 to 35%. The left ventricle has severely decreased function. The left ventricle demonstrates global hypokinesis. Definity contrast agent was given IV to delineate the left ventricular endocardial borders. The left ventricular internal cavity size was normal in size. There is borderline left ventricular hypertrophy. Left ventricular diastolic parameters are consistent with Grade I diastolic dysfunction (impaired relaxation).  Right Ventricle: The right ventricular size is normal. No increase in right ventricular wall thickness. Right ventricular systolic function is mildly reduced.  Left Atrium: Left atrial size was normal in size.  Right Atrium: Right atrial size was normal in size.  Pericardium: There is no evidence of pericardial effusion.  Mitral Valve: The mitral valve was not well visualized. Normal mobility of the mitral valve leaflets. Mild to moderate mitral annular calcification. No evidence of  mitral valve regurgitation. No evidence of mitral valve stenosis.  Tricuspid Valve: The tricuspid valve is not well visualized. Tricuspid valve regurgitation is not demonstrated. No evidence of tricuspid stenosis.  Aortic Valve: The aortic valve was not well visualized. Aortic valve regurgitation is not visualized. No aortic stenosis is present.  Pulmonic Valve: The pulmonic valve was not well visualized. Pulmonic valve regurgitation is not visualized. No evidence of pulmonic stenosis.  Aorta: The aortic root is normal in size and structure.  Venous: The pulmonary veins were not well visualized. The inferior vena cava was not well visualized.  IAS/Shunts: No atrial level shunt detected by color flow Doppler.   LEFT VENTRICLE PLAX 2D LVIDd:         4.90 cm  Diastology LVIDs:         4.00 cm  LV e' lateral:   6.74 cm/s LV PW:         1.10 cm  LV E/e' lateral: 6.0 LV IVS:        1.20 cm  LV e' medial:    4.24 cm/s LVOT diam:     2.00 cm  LV E/e' medial:  9.6 LV SV:         41 LV SV Index:   19 LVOT Area:     3.14 cm   LEFT ATRIUM             Index       RIGHT ATRIUM           Index LA diam:        3.60 cm 1.68 cm/m  RA Area:     12.70 cm LA Vol (A2C):   39.9 ml 18.65 ml/m RA Volume:   30.60 ml  14.30 ml/m LA Vol (A4C):   22.6 ml 10.56 ml/m LA Biplane Vol: 31.0 ml 14.49 ml/m AORTIC VALVE LVOT Vmax:   61.30 cm/s LVOT Vmean:  42.300 cm/s LVOT VTI:    0.129 m  AORTA Ao Root diam: 3.30 cm Ao Asc diam:  3.10 cm  MITRAL VALVE MV Area (PHT): 4.80 cm    SHUNTS MV Decel Time: 158 msec    Systemic VTI:  0.13 m MV E velocity: 40.70 cm/s  Systemic Diam: 2.00 cm MV A velocity: 82.50 cm/s MV E/A ratio:  0.49  Norman Herrlich MD Electronically signed by  Norman Herrlich MD Signature Date/Time: 09/30/2019/4:49:24 PM    Final    MONITORS  LONG TERM MONITOR (3-14 DAYS) 11/12/2019  Narrative A ZIO monitor was performed for 6 days and 14 hours beginning 11/12/2019 to assess  arrhythmia associated with cardiomyopathy and heart failure.  Baseline rhythm was sinus with average, minimum and maximum heart rates of 85, 60 and 121 bpm.  First-degree AV block and conduction delay is present.  There were no pauses of 3 seconds or greater.  There were no episodes of second or third-degree AV nodal block or sinus node exit block.  Ventricular ectopy was rare and no episodes of ventricular tachycardia.  Supraventricular ectopy was rare and no episodes of atrial fibrillation or flutter.  There were 6 triggered events all associated with sinus rhythm.   Conclusion unremarkable 7-day monitor with no episodes of ventricular tachycardia.           Recent lab 02/19/2023 hemoglobin 14.5 platelets 223,000 CMP sodium 136 potassium 4.2 creatinine 0.64 GFR 90 cc/min cholesterol 128 LDL 61 triglycerides 184 HDL 44    Physical Exam:    VS:  BP 92/60 (BP Location: Left Arm, Patient Position: Sitting)   Pulse 74   Ht 5\' 11"  (1.803 m)   Wt 275 lb (124.7 kg)   SpO2 94%   BMI 38.35 kg/m     Wt Readings from Last 3 Encounters:  04/24/23 275 lb (124.7 kg)  11/17/18 207 lb (93.9 kg)  09/26/18 207 lb (93.9 kg)     GEN:  Well nourished, well developed in no acute distress HEENT: Normal NECK: No JVD; No carotid bruits LYMPHATICS: No lymphadenopathy CARDIAC: RRR, no murmurs, rubs, gallops RESPIRATORY:  Clear to auscultation without rales, wheezing or rhonchi  ABDOMEN: Soft, non-tender, non-distended MUSCULOSKELETAL:  No edema; No deformity  SKIN: Warm and dry NEUROLOGIC:  Alert and oriented x 3 PSYCHIATRIC:  Normal affect    Signed, Norman Herrlich, MD  04/24/2023 2:22 PM    [facility]

## 2023-04-24 ENCOUNTER — Encounter: Payer: Self-pay | Admitting: Cardiology

## 2023-04-24 ENCOUNTER — Ambulatory Visit: Payer: Medicare Other | Attending: Cardiology | Admitting: Cardiology

## 2023-04-24 VITALS — BP 92/60 | HR 74 | Ht 71.0 in | Wt 275.0 lb

## 2023-04-24 DIAGNOSIS — I5022 Chronic systolic (congestive) heart failure: Secondary | ICD-10-CM | POA: Diagnosis not present

## 2023-04-24 DIAGNOSIS — I9589 Other hypotension: Secondary | ICD-10-CM

## 2023-04-24 DIAGNOSIS — E782 Mixed hyperlipidemia: Secondary | ICD-10-CM | POA: Diagnosis not present

## 2023-04-24 DIAGNOSIS — I739 Peripheral vascular disease, unspecified: Secondary | ICD-10-CM | POA: Diagnosis not present

## 2023-04-24 NOTE — Patient Instructions (Addendum)
Medication Instructions:  Your physician recommends that you continue on your current medications as directed. Please refer to the Current Medication list given to you today.  *If you need a refill on your cardiac medications before your next appointment, please call your pharmacy*   Lab Work: None If you have labs (blood work) drawn today and your tests are completely normal, you will receive your results only by: MyChart Message (if you have MyChart) OR A paper copy in the mail If you have any lab test that is abnormal or we need to change your treatment, we will call you to review the results.   Testing/Procedures: Your physician has requested that you have an echocardiogram. Echocardiography is a painless test that uses sound waves to create images of your heart. It provides your doctor with information about the size and shape of your heart and how well your heart's chambers and valves are working. This procedure takes approximately one hour. There are no restrictions for this procedure. Please do NOT wear cologne, perfume, aftershave, or lotions (deodorant is allowed). Please arrive 15 minutes prior to your appointment time.   Follow-Up: At [facility], you and your health needs are our priority.  As part of our continuing mission to provide you with exceptional heart care, we have created designated Provider Care Teams.  These Care Teams include your primary Cardiologist (physician) and Advanced Practice Providers (APPs -  Physician Assistants and Nurse Practitioners) who all work together to provide you with the care you need, when you need it.  We recommend signing up for the patient portal called "MyChart".  Sign up information is provided on this After Visit Summary.  MyChart is used to connect with patients for Virtual Visits (Telemedicine).  Patients are able to view lab/test results, encounter notes, upcoming appointments, etc.  Non-urgent messages can be sent to your  provider as well.   To learn more about what you can do with MyChart, go to [email or web address].    Your next appointment:   6 month(s)  Provider:   Norman Herrlich, MD    Other Instructions Purchase a mobile kardia and capture symptoms

## 2023-09-02 ENCOUNTER — Encounter: Payer: Self-pay | Admitting: Podiatry

## 2023-09-02 ENCOUNTER — Ambulatory Visit: Payer: Medicare Other | Admitting: Podiatry

## 2023-09-02 ENCOUNTER — Ambulatory Visit (INDEPENDENT_AMBULATORY_CARE_PROVIDER_SITE_OTHER): Payer: Medicare Other

## 2023-09-02 DIAGNOSIS — I70221 Atherosclerosis of native arteries of extremities with rest pain, right leg: Secondary | ICD-10-CM

## 2023-09-02 DIAGNOSIS — M869 Osteomyelitis, unspecified: Secondary | ICD-10-CM | POA: Diagnosis not present

## 2023-09-02 DIAGNOSIS — M778 Other enthesopathies, not elsewhere classified: Secondary | ICD-10-CM | POA: Diagnosis not present

## 2023-09-02 MED ORDER — CIPROFLOXACIN HCL 500 MG PO TABS: 500.0000 mg | ORAL_TABLET | Freq: Two times a day (BID) | ORAL | 0 refills | Status: AC

## 2023-09-02 MED ORDER — LINEZOLID 600 MG PO TABS: 600.0000 mg | ORAL_TABLET | Freq: Two times a day (BID) | ORAL | 0 refills | Status: AC
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
## 2023-09-03 ENCOUNTER — Telehealth: Payer: Self-pay

## 2023-09-03 NOTE — Telephone Encounter (Signed)
PA request received for Linezolid 600 mg tablet. PA submitted through covermymeds.  Dennise Nixom  (Key: [id number])

## 2023-09-19 ENCOUNTER — Other Ambulatory Visit: Payer: Self-pay | Admitting: Cardiology

## 2023-09-19 NOTE — Telephone Encounter (Signed)
 Medication sent following previous instructions:  sacubitril-valsartan (ENTRESTO) 24-26 MG Medication Date: 08/27/2022 Department: [facility] Ordering/Authorizing: Baldo Daub, MD   Order Providers  Prescribing Provider Encounter Provider  Baldo Daub, MD Baldo Daub, MD   Outpatient Medication Detail   Disp Refills Start End   sacubitril-valsartan (ENTRESTO) 24-26 MG 30 tablet 12 08/27/2022 --   Sig - Route: Take 1 tablet by mouth daily in the afternoon. - Oral   Sent to pharmacy as: sacubitril-valsartan (ENTRESTO) 24-26 MG   E-Prescribing Status: Receipt confirmed by pharmacy (08/27/2022  9:57 AM EST)    Pharmacy  [id number] - [place],  - [address]

## 2023-10-15 ENCOUNTER — Ambulatory Visit: Payer: Medicare Other | Attending: Cardiology

## 2023-10-15 DIAGNOSIS — I9589 Other hypotension: Secondary | ICD-10-CM | POA: Diagnosis not present

## 2023-10-15 DIAGNOSIS — I5022 Chronic systolic (congestive) heart failure: Secondary | ICD-10-CM

## 2023-10-15 DIAGNOSIS — E782 Mixed hyperlipidemia: Secondary | ICD-10-CM | POA: Diagnosis not present

## 2023-10-15 DIAGNOSIS — I739 Peripheral vascular disease, unspecified: Secondary | ICD-10-CM | POA: Diagnosis not present

## 2023-10-15 LAB — ECHOCARDIOGRAM COMPLETE
Area-P 1/2: 7.16 cm2
S' Lateral: 3.7 cm

## 2023-10-15 MED ORDER — PERFLUTREN LIPID MICROSPHERE
1.0000 mL | INTRAVENOUS | Status: AC | PRN
  Administered 2023-10-15: 8 mL via INTRAVENOUS

## 2023-10-23 ENCOUNTER — Ambulatory Visit: Payer: Medicare Other | Admitting: Cardiology

## 2023-11-01 ENCOUNTER — Telehealth: Payer: Self-pay

## 2023-11-01 NOTE — Telephone Encounter (Signed)
 Echo Results reviewed with pt as per Dr. Vanetta Shawl note.  Pt verbalized understanding and had no additional questions. Routed to PCP

## 2024-01-07 ENCOUNTER — Ambulatory Visit: Admitting: Cardiology

## 2024-04-15 DIAGNOSIS — I5022 Chronic systolic (congestive) heart failure: Secondary | ICD-10-CM | POA: Diagnosis not present

## 2024-04-15 DIAGNOSIS — I959 Hypotension, unspecified: Secondary | ICD-10-CM | POA: Diagnosis not present

## 2024-04-16 DIAGNOSIS — I517 Cardiomegaly: Secondary | ICD-10-CM | POA: Diagnosis not present

## 2024-04-16 DIAGNOSIS — I5022 Chronic systolic (congestive) heart failure: Secondary | ICD-10-CM | POA: Diagnosis not present

## 2024-04-16 DIAGNOSIS — I959 Hypotension, unspecified: Secondary | ICD-10-CM | POA: Diagnosis not present

## 2024-04-17 DIAGNOSIS — I5022 Chronic systolic (congestive) heart failure: Secondary | ICD-10-CM | POA: Diagnosis not present

## 2024-04-17 DIAGNOSIS — I959 Hypotension, unspecified: Secondary | ICD-10-CM | POA: Diagnosis not present

## 2024-04-22 ENCOUNTER — Encounter: Payer: Self-pay | Admitting: *Deleted

## 2024-04-23 ENCOUNTER — Telehealth: Payer: Self-pay

## 2024-04-23 ENCOUNTER — Ambulatory Visit: Attending: Cardiology | Admitting: Cardiology

## 2024-04-23 ENCOUNTER — Encounter: Payer: Self-pay | Admitting: Cardiology

## 2024-04-23 ENCOUNTER — Other Ambulatory Visit: Payer: Self-pay

## 2024-04-23 ENCOUNTER — Other Ambulatory Visit: Payer: Self-pay | Admitting: *Deleted

## 2024-04-23 VITALS — BP 102/58 | Ht 68.0 in | Wt 180.0 lb

## 2024-04-23 DIAGNOSIS — I5022 Chronic systolic (congestive) heart failure: Secondary | ICD-10-CM

## 2024-04-23 DIAGNOSIS — I9589 Other hypotension: Secondary | ICD-10-CM | POA: Diagnosis not present

## 2024-04-23 NOTE — Telephone Encounter (Signed)
  Patient Consent for Virtual Visit        Peter Cochran has provided verbal consent on 04/23/2024 for a virtual visit (video or telephone).   CONSENT FOR VIRTUAL VISIT FOR:  Peter Cochran  By participating in this virtual visit I agree to the following:  I hereby voluntarily request, consent and authorize [facility] and its employed or contracted physicians, physician assistants, nurse practitioners or other licensed health care professionals (the Practitioner), to provide me with telemedicine health care services (the "Services) as deemed necessary by the treating Practitioner. I acknowledge and consent to receive the Services by the Practitioner via telemedicine. I understand that the telemedicine visit will involve communicating with the Practitioner through live audiovisual communication technology and the disclosure of certain medical information by electronic transmission. I acknowledge that I have been given the opportunity to request an in-person assessment or other available alternative prior to the telemedicine visit and am voluntarily participating in the telemedicine visit.  I understand that I have the right to withhold or withdraw my consent to the use of telemedicine in the course of my care at any time, without affecting my right to future care or treatment, and that the Practitioner or I may terminate the telemedicine visit at any time. I understand that I have the right to inspect all information obtained and/or recorded in the course of the telemedicine visit and may receive copies of available information for a reasonable fee.  I understand that some of the potential risks of receiving the Services via telemedicine include:  Delay or interruption in medical evaluation due to technological equipment failure or disruption; Information transmitted may not be sufficient (e.g. poor resolution of images) to allow for appropriate medical decision making by the Practitioner;  and/or  In rare instances, security protocols could fail, causing a breach of personal health information.  Furthermore, I acknowledge that it is my responsibility to provide information about my medical history, conditions and care that is complete and accurate to the best of my ability. I acknowledge that Practitioner's advice, recommendations, and/or decision may be based on factors not within their control, such as incomplete or inaccurate data provided by me or distortions of diagnostic images or specimens that may result from electronic transmissions. I understand that the practice of medicine is not an exact science and that Practitioner makes no warranties or guarantees regarding treatment outcomes. I acknowledge that a copy of this consent can be made available to me via my patient portal [facility] MyChart), or I can request a printed copy by calling the office of [facility].    I understand that my insurance will be billed for this visit.   I have read or had this consent read to me. I understand the contents of this consent, which adequately explains the benefits and risks of the Services being provided via telemedicine.  I have been provided ample opportunity to ask questions regarding this consent and the Services and have had my questions answered to my satisfaction. I give my informed consent for the services to be provided through the use of telemedicine in my medical care

## 2024-04-23 NOTE — Patient Instructions (Signed)
 Medication Instructions:  Your physician has recommended you make the following change in your medication:  START: Lasix  20 mg daily  *If you need a refill on your cardiac medications before your next appointment, please call your pharmacy*  Lab Work: Your physician recommends that you return for lab work in:   Labs in the next few days: BMP  If you have labs (blood work) drawn today and your tests are completely normal, you will receive your results only by: MyChart Message (if you have MyChart) OR A paper copy in the mail If you have any lab test that is abnormal or we need to change your treatment, we will call you to review the results.  Testing/Procedures: None  Follow-Up: At [facility], you and your health needs are our priority.  As part of our continuing mission to provide you with exceptional heart care, our providers are all part of one team.  This team includes your primary Cardiologist (physician) and Advanced Practice Providers or APPs (Physician Assistants and Nurse Practitioners) who all work together to provide you with the care you need, when you need it.  Your next appointment:   Keep appointment already scheduled.  Provider:   Redell Leiter, MD    We recommend signing up for the patient portal called MyChart.  Sign up information is provided on this After Visit Summary.  MyChart is used to connect with patients for Virtual Visits (Telemedicine).  Patients are able to view lab/test results, encounter notes, upcoming appointments, etc.  Non-urgent messages can be sent to your provider as well.   To learn more about what you can do with MyChart, go to [email or web address].   Other Instructions None

## 2024-04-23 NOTE — Progress Notes (Signed)
 Virtual Visit via Video Note   Because of Peter Cochran's co-morbid illnesses, he is at least at moderate risk for complications without adequate follow up.  This format is felt to be most appropriate for this patient at this time.  All issues noted in this document were discussed and addressed.  A limited physical exam was performed with this format.  Please refer to the patient's chart for his consent to telehealth for [facility].       Date:  04/23/2024   ID:  Peter Cochran, DOB Jul 24, 1955, MRN [id number] The patient was identified using 2 identifiers.  Patient Location: Home Provider Location: Home Office   PCP:  Goins, Darice BROCKS, FNP   [facility] Providers Cardiologist:  None     Evaluation Performed:  Follow-Up Visit  Chief Complaint:  FU from hospital  History of Present Illness:    Peter DESHA is a 68 y.o. male with complicated cardiac history of cardiomyopathy heart failure chronic hypotension hyperlipidemia peripheral arterial disease Recently at [facility] with sepsis he had a PAD abscess in his leg and had an amputation. His course was complicated by hypotension he received an enormous volume of IV fluid resuscitation and had rapidly progressive renal failure Dialysis recommended he decided to go home. He is now formally on hospice his wife calls and wants to know if he should restart cardiac medications. I think he should resume a diuretic furosemide  daily 20 mg If we can check renal function then we can resume Entresto  and/or carvedilol  in the absence of but I be afraid to do it with his renal failure Fortunately he is quite comfortable he is not short of breath he has no apparent respiratory distress or edema   Past Medical History:  Diagnosis Date   AKI (acute kidney injury)    Anemia 03/08/2018   Antibiotic long-term use    Bilateral lower leg cellulitis    BKA stump complication (HCC)    Cellulitis    mild.  L leg   CHF  (congestive heart failure) (HCC)    Dr. Redell Leiter   Chronic hypotension 03/08/2018   Chronic systolic heart failure (HCC) 10/28/2017   Chronic ulcer of left lower extremity with fat layer exposed (HCC) 10/27/2017   Complication of anesthesia    'hard time waking up   Difficulty voiding    Dilated cardiomyopathy (HCC) 10/27/2017   Dry gangrene (HCC)    Possible in toes.   Elevated brain natriuretic peptide (BNP) level 10/27/2017   Elevated troponin 10/27/2017   Enlarged prostate    Fatigue 10/22/2017   Gangrene of toe of left foot (HCC)    Hand fracture, right    Dr. 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 Dr. Raynell Nap, Dr. Jeb Gola.   Past Surgical History:  Procedure Laterality Date   AMPUTATION Left 02/28/2018   Procedure: LEFT  BELOW KNEE AMPUTATION;  Surgeon: Harden Jerona GAILS, MD;  Location[facility] OR;  Service: Orthopedics;  Laterality: Left;   AORTOGRAM Left 12/12/2017   Procedure: AORTOGRAM LEFT LOWER EXTREMITY RUNOFF;  Surgeon: Laurence Redell CROME, MD;  Location: [facility] OR;  Service: Vascular;  Laterality: Left;   BELOW KNEE LEG AMPUTATION Left 02/28/2018   CYST EXCISION     PERIPHERAL VASCULAR BALLOON ANGIOPLASTY Left 12/12/2017   Procedure: PERIPHERAL VASCULAR BALLOON ANGIOPLASTY LEFT SFA;  Surgeon: Laurence Redell CROME, MD;  Location: [facility] OR;  Service: Vascular;  Laterality: Left;   PILONIDAL CYST DRAINAGE     STUMP REVISION Left 03/07/2018   Procedure: REVISION  LEFT BELOW KNEE AMPUTATION VS. LEFT ABOVE KNEE AMPUTATION;  Surgeon: Harden Jerona GAILS, MD;  Location: [facility] OR;  Service: Orthopedics;  Laterality: Left;     Current Meds  Medication Sig   albuterol (VENTOLIN HFA) 108 (90 Base) MCG/ACT inhaler Inhale 2 puffs into the lungs every 6 (six) hours as needed for wheezing or shortness of breath.   aspirin  EC 81 MG tablet Take 81 mg by mouth daily.   atorvastatin  (LIPITOR) 40 MG tablet TAKE ONE (1) TABLET ONCE DAILY (Patient taking differently: Take 40 mg by mouth daily.)   carvedilol  (COREG ) 3.125 MG tablet TAKE ONE TABLET TWICE DAILY WITH MEALS (Patient taking differently: Take 3.125 mg by mouth 2 (two) times daily with a meal.)   clopidogrel  (PLAVIX ) 75 MG tablet Take 1 tablet (75 mg total) by mouth daily with breakfast.   dapagliflozin  propanediol (FARXIGA ) 10 MG TABS tablet Take 1 tablet (10 mg total) by mouth daily before breakfast.   ENTRESTO  24-26 MG TAKE 1 TABLET BY MOUTH DAILY IN THE AFTERNOON   furosemide  (LASIX ) 20 MG tablet Take 1 tablet (20 mg total) by mouth daily.   gabapentin  (NEURONTIN ) 600 MG tablet Take 600 mg by mouth 3 (three) times daily.   levothyroxine  (SYNTHROID ) 200 MCG tablet Take 200 mcg by mouth daily.   Multiple Vitamin (MULTIVITAMIN WITH MINERALS) TABS tablet Take 1 tablet by mouth daily.   spironolactone  (ALDACTONE ) 25 MG tablet TAKE 1 TABLET BY MOUTH EVERY DAY. (Patient taking differently: Take 25 mg by mouth daily.)     Allergies:   Oxytetracycline and Sulfa antibiotics   Social History   Tobacco Use   Smoking status: Every Day    Current packs/day: 1.50    Average packs/day: 1.5 packs/day for 50.0 years (75.0 ttl pk-yrs)    Types: Cigarettes    Passive exposure: Current   Smokeless tobacco: Never   Tobacco comments:    1-1.5 pks per day  Vaping Use   Vaping status: Never Used  Substance Use Topics   Alcohol use: Not Currently   Drug use: Not Currently     Family Hx: The patient's family history includes  Diabetes in his father; Heart attack in his father; Hyperlipidemia in his father and mother; Hypertension in his father and mother; Stroke in his maternal grandmother.  ROS:   Please see the history of present illness.     All other systems reviewed and are negative.   Prior CV studies:   The following studies were reviewed today:  I reviewed his hospital records before the visit  Labs/Other Tests and Data Reviewed:    EKG:  No ECG reviewed.  Recent Labs: No results found for requested labs within last 365 days.   Recent Lipid Panel No results found for: CHOL, TRIG, HDL, CHOLHDL, LDLCALC, LDLDIRECT  Wt Readings from Last 3  Encounters:  04/23/24 180 lb (81.6 kg)  04/24/23 275 lb (124.7 kg)  11/17/18 207 lb (93.9 kg)     Risk Assessment/Calculations:          Objective:    Vital Signs:  BP (!) 102/58   Ht 5' 8 (1.727 m)   Wt 180 lb (81.6 kg)   BMI 27.37 kg/m    VITAL SIGNS:  reviewed GEN:  no acute distress He has no pallor of the skin or membranes no respiratory effort No obvious bronchospasm or rhonchi No pulsation of the neck and no edema to visual inspection  ASSESSMENT & PLAN:    Heart failure looks compensated I do think he needs to restart a diuretic and if we can check lab work we could consider putting him back on Entresto  for palliation of heart failure symptoms They are okay issues where he is at with his renal failure He has an office appointment            Time:   Today, I have spent 20 minutes with the patient with telehealth technology discussing the above problems.     Medication Adjustments/Labs and Tests Ordered: Current medicines are reviewed at length with the patient today.  Concerns regarding medicines are outlined above.   Tests Ordered: No orders of the defined types were placed in this encounter.   Medication Changes: No orders of the defined types were placed in this encounter.   Follow Up:  In Person  already scheduled in the office if he can come  Signed, Redell Leiter, MD  04/23/2024 1:36 PM    [facility]

## 2024-05-15 ENCOUNTER — Ambulatory Visit: Admitting: Cardiology

## 2024-08-16 DEATH — deceased
